# Patient Record
Sex: Male | Born: 1945
Health system: Southern US, Community
[De-identification: ages and names within clinical notes are randomized; demographics above are authoritative.]

## PROBLEM LIST (undated history)

## (undated) DIAGNOSIS — G629 Polyneuropathy, unspecified: Secondary | ICD-10-CM

## (undated) DIAGNOSIS — I739 Peripheral vascular disease, unspecified: Secondary | ICD-10-CM

## (undated) DIAGNOSIS — R040 Epistaxis: Secondary | ICD-10-CM

## (undated) DIAGNOSIS — H409 Unspecified glaucoma: Secondary | ICD-10-CM

## (undated) DIAGNOSIS — E7800 Pure hypercholesterolemia, unspecified: Secondary | ICD-10-CM

## (undated) HISTORY — PX: BACK SURGERY: SHX140

## (undated) HISTORY — DX: Unspecified glaucoma: H40.9

## (undated) HISTORY — DX: Peripheral vascular disease, unspecified: I73.9

## (undated) HISTORY — DX: Pure hypercholesterolemia, unspecified: E78.00

---
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
## 1997-12-21 ENCOUNTER — Emergency Department (HOSPITAL_COMMUNITY): Admission: EM | Admit: 1997-12-21 | Discharge: 1997-12-21 | Payer: Self-pay | Admitting: Emergency Medicine

## 2000-04-14 ENCOUNTER — Ambulatory Visit (HOSPITAL_COMMUNITY): Admission: RE | Admit: 2000-04-14 | Discharge: 2000-04-14 | Payer: Self-pay | Admitting: Neurosurgery

## 2000-04-14 ENCOUNTER — Encounter: Payer: Self-pay | Admitting: Neurosurgery

## 2003-04-04 ENCOUNTER — Emergency Department (HOSPITAL_COMMUNITY): Admission: EM | Admit: 2003-04-04 | Discharge: 2003-04-04 | Payer: Self-pay | Admitting: Emergency Medicine

## 2005-05-08 HISTORY — PX: CATARACT EXTRACTION: SUR2

## 2006-08-13 ENCOUNTER — Emergency Department (HOSPITAL_COMMUNITY): Admission: EM | Admit: 2006-08-13 | Discharge: 2006-08-13 | Payer: Self-pay | Admitting: Emergency Medicine

## 2008-04-26 ENCOUNTER — Emergency Department (HOSPITAL_COMMUNITY): Admission: EM | Admit: 2008-04-26 | Discharge: 2008-04-26 | Payer: Self-pay | Admitting: Emergency Medicine

## 2015-06-01 DIAGNOSIS — H401131 Primary open-angle glaucoma, bilateral, mild stage: Secondary | ICD-10-CM | POA: Diagnosis not present

## 2015-07-23 DIAGNOSIS — J019 Acute sinusitis, unspecified: Secondary | ICD-10-CM | POA: Diagnosis not present

## 2015-07-23 DIAGNOSIS — J209 Acute bronchitis, unspecified: Secondary | ICD-10-CM | POA: Diagnosis not present

## 2015-07-30 DIAGNOSIS — J181 Lobar pneumonia, unspecified organism: Secondary | ICD-10-CM | POA: Diagnosis not present

## 2015-09-21 DIAGNOSIS — H401131 Primary open-angle glaucoma, bilateral, mild stage: Secondary | ICD-10-CM | POA: Diagnosis not present

## 2015-10-18 DIAGNOSIS — K529 Noninfective gastroenteritis and colitis, unspecified: Secondary | ICD-10-CM | POA: Diagnosis not present

## 2015-10-19 DIAGNOSIS — E7800 Pure hypercholesterolemia, unspecified: Secondary | ICD-10-CM | POA: Diagnosis not present

## 2015-10-19 DIAGNOSIS — R739 Hyperglycemia, unspecified: Secondary | ICD-10-CM | POA: Diagnosis not present

## 2015-10-19 DIAGNOSIS — I10 Essential (primary) hypertension: Secondary | ICD-10-CM | POA: Diagnosis not present

## 2015-10-21 DIAGNOSIS — G629 Polyneuropathy, unspecified: Secondary | ICD-10-CM | POA: Diagnosis not present

## 2015-10-21 DIAGNOSIS — H4053X1 Glaucoma secondary to other eye disorders, bilateral, mild stage: Secondary | ICD-10-CM | POA: Diagnosis not present

## 2015-10-21 DIAGNOSIS — M545 Low back pain: Secondary | ICD-10-CM | POA: Diagnosis not present

## 2015-10-21 DIAGNOSIS — R739 Hyperglycemia, unspecified: Secondary | ICD-10-CM | POA: Diagnosis not present

## 2016-01-04 DIAGNOSIS — H401131 Primary open-angle glaucoma, bilateral, mild stage: Secondary | ICD-10-CM | POA: Diagnosis not present

## 2016-01-04 DIAGNOSIS — Z961 Presence of intraocular lens: Secondary | ICD-10-CM | POA: Diagnosis not present

## 2016-01-04 DIAGNOSIS — H524 Presbyopia: Secondary | ICD-10-CM | POA: Diagnosis not present

## 2016-01-18 DIAGNOSIS — D1809 Hemangioma of other sites: Secondary | ICD-10-CM | POA: Diagnosis not present

## 2016-01-26 DIAGNOSIS — T148 Other injury of unspecified body region: Secondary | ICD-10-CM | POA: Diagnosis not present

## 2016-01-26 DIAGNOSIS — D1809 Hemangioma of other sites: Secondary | ICD-10-CM | POA: Diagnosis not present

## 2016-03-11 DIAGNOSIS — Z6826 Body mass index (BMI) 26.0-26.9, adult: Secondary | ICD-10-CM | POA: Diagnosis not present

## 2016-03-11 DIAGNOSIS — S93529A Sprain of metatarsophalangeal joint of unspecified toe(s), initial encounter: Secondary | ICD-10-CM | POA: Diagnosis not present

## 2016-03-11 DIAGNOSIS — S90412A Abrasion, left great toe, initial encounter: Secondary | ICD-10-CM | POA: Diagnosis not present

## 2016-04-10 DIAGNOSIS — H401131 Primary open-angle glaucoma, bilateral, mild stage: Secondary | ICD-10-CM | POA: Diagnosis not present

## 2016-04-19 DIAGNOSIS — I10 Essential (primary) hypertension: Secondary | ICD-10-CM | POA: Diagnosis not present

## 2016-04-19 DIAGNOSIS — E7800 Pure hypercholesterolemia, unspecified: Secondary | ICD-10-CM | POA: Diagnosis not present

## 2016-04-19 DIAGNOSIS — R739 Hyperglycemia, unspecified: Secondary | ICD-10-CM | POA: Diagnosis not present

## 2016-04-24 DIAGNOSIS — H4053X1 Glaucoma secondary to other eye disorders, bilateral, mild stage: Secondary | ICD-10-CM | POA: Diagnosis not present

## 2016-04-24 DIAGNOSIS — Z1389 Encounter for screening for other disorder: Secondary | ICD-10-CM | POA: Diagnosis not present

## 2016-04-24 DIAGNOSIS — G629 Polyneuropathy, unspecified: Secondary | ICD-10-CM | POA: Diagnosis not present

## 2016-04-24 DIAGNOSIS — M545 Low back pain: Secondary | ICD-10-CM | POA: Diagnosis not present

## 2016-04-24 DIAGNOSIS — D485 Neoplasm of uncertain behavior of skin: Secondary | ICD-10-CM | POA: Diagnosis not present

## 2016-04-24 DIAGNOSIS — R739 Hyperglycemia, unspecified: Secondary | ICD-10-CM | POA: Diagnosis not present

## 2016-04-25 DIAGNOSIS — D225 Melanocytic nevi of trunk: Secondary | ICD-10-CM | POA: Diagnosis not present

## 2016-04-25 DIAGNOSIS — L708 Other acne: Secondary | ICD-10-CM | POA: Diagnosis not present

## 2016-06-06 DIAGNOSIS — I872 Venous insufficiency (chronic) (peripheral): Secondary | ICD-10-CM | POA: Diagnosis not present

## 2016-06-06 DIAGNOSIS — L280 Lichen simplex chronicus: Secondary | ICD-10-CM | POA: Diagnosis not present

## 2016-07-25 DIAGNOSIS — H401131 Primary open-angle glaucoma, bilateral, mild stage: Secondary | ICD-10-CM | POA: Diagnosis not present

## 2016-08-14 DIAGNOSIS — H6503 Acute serous otitis media, bilateral: Secondary | ICD-10-CM | POA: Diagnosis not present

## 2016-08-14 DIAGNOSIS — M21371 Foot drop, right foot: Secondary | ICD-10-CM | POA: Diagnosis not present

## 2016-08-14 DIAGNOSIS — Z6826 Body mass index (BMI) 26.0-26.9, adult: Secondary | ICD-10-CM | POA: Diagnosis not present

## 2016-08-21 ENCOUNTER — Ambulatory Visit: Payer: PPO | Admitting: Neurology

## 2016-08-21 ENCOUNTER — Telehealth: Payer: Self-pay | Admitting: *Deleted

## 2016-08-21 NOTE — Telephone Encounter (Signed)
Called and spoke with patient. Advised office closed today. Need to r/s appt. He preferred sooner appt. Scheduled with AA,MD 08/31/16 at 8am, check in 730am. He verbalized understanding and appreciation.

## 2016-08-31 ENCOUNTER — Encounter: Payer: Self-pay | Admitting: Neurology

## 2016-08-31 ENCOUNTER — Ambulatory Visit (INDEPENDENT_AMBULATORY_CARE_PROVIDER_SITE_OTHER): Payer: PPO | Admitting: Neurology

## 2016-08-31 VITALS — BP 158/79 | HR 58 | Ht 73.0 in | Wt 191.8 lb

## 2016-08-31 DIAGNOSIS — G8929 Other chronic pain: Secondary | ICD-10-CM | POA: Diagnosis not present

## 2016-08-31 DIAGNOSIS — W19XXXA Unspecified fall, initial encounter: Secondary | ICD-10-CM

## 2016-08-31 DIAGNOSIS — M5440 Lumbago with sciatica, unspecified side: Secondary | ICD-10-CM

## 2016-08-31 DIAGNOSIS — M21371 Foot drop, right foot: Secondary | ICD-10-CM | POA: Diagnosis not present

## 2016-08-31 DIAGNOSIS — M5417 Radiculopathy, lumbosacral region: Secondary | ICD-10-CM

## 2016-08-31 NOTE — Progress Notes (Signed)
[facility]    Provider:  Dr Jaynee Eagles Referring Provider: Manon Hilding, MD Primary Care Physician:  Manon Hilding, MD  CC:  Right foot drop  HPI:  Roberto Rosales is a 71 y.o. male here as a referral from Dr. Quintin Alto for right foot drop. Back pain started when he was 15. He lifted heavy boxes and had back pain. Over the years he had flares in extreme pain. The pain would feel weakness, pain in the lower back bilaterally, he has also had sciatica always on the right which is the side of the foot drop. He has right foot numbness. Not as bad in the morning, worse as the day goes on. All the toes of the right foot to the bottom of the foot on the right only. Right foot weakness started a few years ago and it flops on the floor, slowly progressive. One day he was driving and had difficulty feeling the brake pedal. He has had falls and difficulty walking. He had broken bones. He feels weak in his back. He tries to go the gym and exercise for years. He does not lift heavy items.   Reviewed notes, labs and imaging from outside physicians, which showed: Reviewed primary care notes. Presented with paresthesias. Located on both feet. As described is acute. With a root right foot drop. Symptoms started 2-3 years ago. A moderately limits activities. Reviewed lab work showed LDL 75, CMP unremarkable with BUN 14 and creatinine 0.86 drawn in December 2018.  Reviewed report MRI lumbar spine 2001:  FINDINGS CLINICAL HISTORY:  LOW BACK PAIN WITH RECENT EXTENSION INTO THE RIGHT LEG. MR OF THE LUMBOSACRAL SPINE:NO COMPARISON PLAIN FILMS.  FOR THE PRESENT EXAM I WILL ASSUME THERE ARE FIVE NONRIBBEARING LUMBAR TYPE VERTEBRAE.  PRIOR TO ANY INTERVENTION, I WOULD RECOMMEND CORRELATION WITH PLAIN FILMS TO HELP CONFIRM LEVEL ASSIGNMENT. FINDINGS:  CONUS IS LOCATED AT THE L1-2 DISK SPACE LEVEL.  SMALL HEMANGIOMAS/FOCAL FAT, RIGHT ASPECT, L1 AND MID L3 VERTEBRAE.  WITHIN THE LEFT ASPECT OF THE L5 VERTEBRA  THERE IS A LESION WHICH IS HYPOINTENSE ON ALL PULSE SEQUENCES AND DOES NOT HAVE AN AGGRESSIVE APPEARANCE.  T12-L1 AND L1-2  UNREMARKABLE. L2-3:  MINIMAL DISK DEGENERATION AND BULGE. L3-4:  MILD DISK DEGENERATION AND BULGE. L4-5:  MODERATE DISK DEGENERATION AND DISK SPACE NARROWING WITH SOME ASSOCIATED DEGENERATIVE END PLATE CHANGES.  THERE IS AN UNDERLYING MODERATE DIFFUSE BULGE WITH SUPERIMPOSED, MODERATE SIZE, RIGHT POSTEROLATERAL DISK HERNIATION WITH EXTRUDED DISK MATERIAL EXTENDING SLIGHTLY CEPHALAD FROM THE DISK SPACE LEVEL ON THE ELEVATED POSTERIOR LONGITUDINAL LIGAMENT INSINUATING ITSELF BETWEEN THE RIGHT ASPECT OF THE THECAL SAC (WHICH IS DISPLACED AND NARROWED) AND THE ORIGIN OF THE RIGHT L4-5 NEURAL FORAMEN. L5-S1:  NEGATIVE. IMPRESSION: MOST SIGNIFICANT FINDING ON THE PRESENT EXAM IS THE MODERATE SIZE RIGHT POSTEROLATERAL L4-5 DISK HERNIATION WITH EXTRUDED DISK FRAGMENT EXTENDING IN A CEPHALAD DIRECTION AND ELEVATING THE POSTERIOR LONGITUDINAL LIGAMENT WITH MASS EFFECT UPON THE RIGHT ASPECT OF THE THECAL SAC, AS DISCUSSED ABOVE.  THE UNDERLYING L4-5 DISK IS DEGENERATED WITH MODERATE BULGE.  Review of Systems: Patient complains of symptoms per HPI as well as the following symptoms: no CP. Pertinent negatives per HPI. All others negative.   Social History   Social History  . Marital status: Married    Spouse name: N/A  . Number of children: 2  . Years of education: 14   Occupational History  . Retired    Social History Main Topics  . Smoking status: Never Smoker  .  Smokeless tobacco: Never Used  . Alcohol use No  . Drug use: No  . Sexual activity: Not on file   Other Topics Concern  . Not on file   Social History Narrative   Lives at home w/ his wife   Right-handed   Caffeine: 1 - 1 1/2 cups coffee per day    Family History  Problem Relation Age of Onset  . Cerebral aneurysm Mother   . Heart disease Father   . Stroke Father     Past Medical History:  Diagnosis  Date  . Glaucoma   . High cholesterol     Past Surgical History:  Procedure Laterality Date  . CATARACT EXTRACTION Bilateral 2007  . TONSILLECTOMY  1950    Current Outpatient Prescriptions  Medication Sig Dispense Refill  . aspirin EC 81 MG tablet Take 81 mg by mouth daily.    Marland Kitchen atorvastatin (LIPITOR) 10 MG tablet     . latanoprost (XALATAN) 0.005 % ophthalmic solution     . Multiple Vitamin (MULTIVITAMIN) tablet Take 1 tablet by mouth daily.     No current facility-administered medications for this visit.     Allergies as of 08/31/2016 - Review Complete 08/31/2016  Allergen Reaction Noted  . Codeine Nausea Only 09/21/2015    Vitals: BP (!) 158/79   Pulse (!) 58   Ht 6\' 1"  (1.854 m)   Wt 191 lb 12.8 oz (87 kg)   BMI 25.30 kg/m  Last Weight:  Wt Readings from Last 1 Encounters:  08/31/16 191 lb 12.8 oz (87 kg)   Last Height:   Ht Readings from Last 1 Encounters:  08/31/16 6\' 1"  (1.854 m)   Physical exam: Exam: Gen: NAD, conversant, well nourised, obese, well groomed                     CV: RRR, no MRG. No Carotid Bruits. No peripheral edema, warm, nontender Eyes: Conjunctivae clear without exudates or hemorrhage  Neuro: Detailed Neurologic Exam  Speech:    Speech is normal; fluent and spontaneous with normal comprehension.  Cognition:    The patient is oriented to person, place, and time;     recent and remote memory intact;     language fluent;     normal attention, concentration,     fund of knowledge Cranial Nerves:    The pupils are equal, round, and reactive to light.Attempted fundoscopic exam could not visualize due to small pupils. . Visual fields are full to finger confrontation. Extraocular movements are intact. Trigeminal sensation is intact and the muscles of mastication are normal. The face is symmetric. The palate elevates in the midline. Hearing intact. Voice is normal. Shoulder shrug is normal. The tongue has normal motion without  fasciculations.   Coordination:    Normal finger to nose and heel to shin.   Gait:    Cannot heel walk on the right  Motor Observation:    No asymmetry, no atrophy, and no involuntary movements noted. Tone:    Normal muscle tone.    Posture:    Posture is normal. normal erect    Strength: Right weakness biceps femoris, Dorsiflexion, inversion and eversion. Dorsiflexion most affected 4/5. Otherwise strength is V/V in the upper and lower limbs.      Sensation: Decreased in an L5 distribution on the right     Reflex Exam:  DTR's:    Deep tendon reflexes in the lower extremities are absent Toes:    The toes  are downgoing bilaterally.   Clonus:    Clonus is absent.       Assessment/Plan:  This is a lovely 71 year old patient with likely right L5 radiculopathy leading to right foot drop and right leg weakness L5 myotome, numbness L5 distribution, falls. Need MRI of the lumbar spine for surgical evaluation. Fall risk, fall precautions. Discussed fall precautions with patient.  Cc: Manon Hilding, MD   Sarina Ill, MD  [facility] [address] [place], [place] [address]  Phone [phone] Fax [phone]

## 2016-08-31 NOTE — Patient Instructions (Signed)
Remember to drink plenty of fluid, eat healthy meals and do not skip any meals. Try to eat protein with a every meal and eat a healthy snack such as fruit or nuts in between meals. Try to keep a regular sleep-wake schedule and try to exercise daily, particularly in the form of walking, 20-30 minutes a day, if you can.   As far as diagnostic testing: MRI lumbar spine  Our phone number is [phone]. We also have an after hours call service for urgent matters and there is a physician on-call for urgent questions. For any emergencies you know to call 911 or go to the nearest emergency room

## 2016-09-18 ENCOUNTER — Ambulatory Visit (HOSPITAL_COMMUNITY)
Admission: RE | Admit: 2016-09-18 | Discharge: 2016-09-18 | Disposition: A | Discharge status: Home / Self Care | Payer: PPO | Source: Ambulatory Visit | Attending: Neurology | Admitting: Neurology

## 2016-09-18 DIAGNOSIS — G8929 Other chronic pain: Secondary | ICD-10-CM | POA: Diagnosis not present

## 2016-09-18 DIAGNOSIS — M5417 Radiculopathy, lumbosacral region: Secondary | ICD-10-CM | POA: Diagnosis not present

## 2016-09-18 DIAGNOSIS — M48061 Spinal stenosis, lumbar region without neurogenic claudication: Secondary | ICD-10-CM | POA: Diagnosis not present

## 2016-09-18 DIAGNOSIS — M4186 Other forms of scoliosis, lumbar region: Secondary | ICD-10-CM | POA: Insufficient documentation

## 2016-09-18 DIAGNOSIS — M5440 Lumbago with sciatica, unspecified side: Secondary | ICD-10-CM | POA: Insufficient documentation

## 2016-09-18 DIAGNOSIS — W19XXXA Unspecified fall, initial encounter: Secondary | ICD-10-CM

## 2016-09-18 DIAGNOSIS — M5124 Other intervertebral disc displacement, thoracic region: Secondary | ICD-10-CM | POA: Insufficient documentation

## 2016-09-18 DIAGNOSIS — M4606 Spinal enthesopathy, lumbar region: Secondary | ICD-10-CM | POA: Insufficient documentation

## 2016-09-18 DIAGNOSIS — S3992XA Unspecified injury of lower back, initial encounter: Secondary | ICD-10-CM | POA: Diagnosis not present

## 2016-09-18 DIAGNOSIS — M5136 Other intervertebral disc degeneration, lumbar region: Secondary | ICD-10-CM | POA: Insufficient documentation

## 2016-09-18 DIAGNOSIS — M5126 Other intervertebral disc displacement, lumbar region: Secondary | ICD-10-CM | POA: Insufficient documentation

## 2016-09-18 DIAGNOSIS — D1809 Hemangioma of other sites: Secondary | ICD-10-CM | POA: Insufficient documentation

## 2016-09-18 DIAGNOSIS — M21371 Foot drop, right foot: Secondary | ICD-10-CM | POA: Insufficient documentation

## 2016-09-25 ENCOUNTER — Telehealth: Payer: Self-pay | Admitting: Neurology

## 2016-09-25 DIAGNOSIS — M21371 Foot drop, right foot: Secondary | ICD-10-CM

## 2016-09-25 DIAGNOSIS — R937 Abnormal findings on diagnostic imaging of other parts of musculoskeletal system: Secondary | ICD-10-CM

## 2016-09-25 NOTE — Telephone Encounter (Signed)
Pt says he has the results of the MRI he wold like to be contacted by Dr Jaynee Eagles once she has had a chance to review, please call mobile

## 2016-09-26 NOTE — Telephone Encounter (Signed)
Returned pt TC w/ MRI results. He verbalized understanding and is agreeable to neurosurg referral.  Melvenia Beam, MD  Monte Fantasia, RN      Patient has pinched nerves in his low back on the right and this is likely causing his foot drop. I reocmmend neusosurgical evaluation for decompression. Please let me know if he is ok and you or I can order a NSY eval for right L5 radiculopathy and right foot drop. thanks    Referral orders entered.

## 2016-09-26 NOTE — Addendum Note (Signed)
Addended by: Monte Fantasia on: 09/26/2016 11:50 AM   Modules accepted: Orders

## 2016-10-04 DIAGNOSIS — M5417 Radiculopathy, lumbosacral region: Secondary | ICD-10-CM | POA: Diagnosis not present

## 2016-10-04 DIAGNOSIS — R030 Elevated blood-pressure reading, without diagnosis of hypertension: Secondary | ICD-10-CM | POA: Diagnosis not present

## 2016-10-04 DIAGNOSIS — M21371 Foot drop, right foot: Secondary | ICD-10-CM | POA: Diagnosis not present

## 2016-10-04 DIAGNOSIS — Z6825 Body mass index (BMI) 25.0-25.9, adult: Secondary | ICD-10-CM | POA: Diagnosis not present

## 2016-10-18 DIAGNOSIS — R739 Hyperglycemia, unspecified: Secondary | ICD-10-CM | POA: Diagnosis not present

## 2016-10-18 DIAGNOSIS — E7800 Pure hypercholesterolemia, unspecified: Secondary | ICD-10-CM | POA: Diagnosis not present

## 2016-10-18 DIAGNOSIS — I10 Essential (primary) hypertension: Secondary | ICD-10-CM | POA: Diagnosis not present

## 2016-10-23 DIAGNOSIS — E7800 Pure hypercholesterolemia, unspecified: Secondary | ICD-10-CM | POA: Diagnosis not present

## 2016-10-23 DIAGNOSIS — G629 Polyneuropathy, unspecified: Secondary | ICD-10-CM | POA: Diagnosis not present

## 2016-10-23 DIAGNOSIS — M545 Low back pain: Secondary | ICD-10-CM | POA: Diagnosis not present

## 2016-10-23 DIAGNOSIS — H4053X1 Glaucoma secondary to other eye disorders, bilateral, mild stage: Secondary | ICD-10-CM | POA: Diagnosis not present

## 2016-10-23 DIAGNOSIS — Z6826 Body mass index (BMI) 26.0-26.9, adult: Secondary | ICD-10-CM | POA: Diagnosis not present

## 2016-10-23 DIAGNOSIS — R739 Hyperglycemia, unspecified: Secondary | ICD-10-CM | POA: Diagnosis not present

## 2016-10-31 DIAGNOSIS — M5416 Radiculopathy, lumbar region: Secondary | ICD-10-CM | POA: Diagnosis not present

## 2016-10-31 DIAGNOSIS — M4316 Spondylolisthesis, lumbar region: Secondary | ICD-10-CM | POA: Diagnosis not present

## 2016-10-31 DIAGNOSIS — M48061 Spinal stenosis, lumbar region without neurogenic claudication: Secondary | ICD-10-CM | POA: Diagnosis not present

## 2016-10-31 DIAGNOSIS — M47896 Other spondylosis, lumbar region: Secondary | ICD-10-CM | POA: Diagnosis not present

## 2016-11-14 DIAGNOSIS — H401131 Primary open-angle glaucoma, bilateral, mild stage: Secondary | ICD-10-CM | POA: Diagnosis not present

## 2016-12-27 DIAGNOSIS — I872 Venous insufficiency (chronic) (peripheral): Secondary | ICD-10-CM | POA: Diagnosis not present

## 2016-12-27 DIAGNOSIS — D1801 Hemangioma of skin and subcutaneous tissue: Secondary | ICD-10-CM | POA: Diagnosis not present

## 2017-01-15 DIAGNOSIS — Z6826 Body mass index (BMI) 26.0-26.9, adult: Secondary | ICD-10-CM | POA: Diagnosis not present

## 2017-01-15 DIAGNOSIS — R202 Paresthesia of skin: Secondary | ICD-10-CM | POA: Diagnosis not present

## 2017-01-29 DIAGNOSIS — Z6826 Body mass index (BMI) 26.0-26.9, adult: Secondary | ICD-10-CM | POA: Diagnosis not present

## 2017-01-29 DIAGNOSIS — R202 Paresthesia of skin: Secondary | ICD-10-CM | POA: Diagnosis not present

## 2017-01-29 DIAGNOSIS — G5601 Carpal tunnel syndrome, right upper limb: Secondary | ICD-10-CM | POA: Diagnosis not present

## 2017-02-20 DIAGNOSIS — Z961 Presence of intraocular lens: Secondary | ICD-10-CM | POA: Diagnosis not present

## 2017-02-20 DIAGNOSIS — H401131 Primary open-angle glaucoma, bilateral, mild stage: Secondary | ICD-10-CM | POA: Diagnosis not present

## 2017-02-20 DIAGNOSIS — G245 Blepharospasm: Secondary | ICD-10-CM | POA: Diagnosis not present

## 2017-02-22 ENCOUNTER — Ambulatory Visit (INDEPENDENT_AMBULATORY_CARE_PROVIDER_SITE_OTHER): Payer: PPO | Admitting: Neurology

## 2017-02-22 ENCOUNTER — Ambulatory Visit (INDEPENDENT_AMBULATORY_CARE_PROVIDER_SITE_OTHER): Payer: Self-pay | Admitting: Neurology

## 2017-02-22 DIAGNOSIS — G5601 Carpal tunnel syndrome, right upper limb: Secondary | ICD-10-CM | POA: Diagnosis not present

## 2017-02-22 DIAGNOSIS — G629 Polyneuropathy, unspecified: Secondary | ICD-10-CM

## 2017-02-22 DIAGNOSIS — R7309 Other abnormal glucose: Secondary | ICD-10-CM | POA: Diagnosis not present

## 2017-02-22 DIAGNOSIS — E519 Thiamine deficiency, unspecified: Secondary | ICD-10-CM | POA: Diagnosis not present

## 2017-02-22 DIAGNOSIS — M21371 Foot drop, right foot: Secondary | ICD-10-CM | POA: Diagnosis not present

## 2017-02-22 DIAGNOSIS — E538 Deficiency of other specified B group vitamins: Secondary | ICD-10-CM

## 2017-02-22 DIAGNOSIS — E531 Pyridoxine deficiency: Secondary | ICD-10-CM | POA: Diagnosis not present

## 2017-02-22 DIAGNOSIS — Z0289 Encounter for other administrative examinations: Secondary | ICD-10-CM

## 2017-02-23 NOTE — Progress Notes (Addendum)
Full Name: Roberto Rosales Gender: Male MRN #: [id number] Date of Birth: [birth year]    Visit Date: 02/22/17 08:53 Age: 72 Years 81 Months Old Examining Physician: Sarina Ill, MD  Referring Physician: Consuello Masse, MD  History: Patient with right hand pain and weakness. +Tinel's sign and Phalen's maneuver.  Summary: The right median motor distal peak latency was delayed (5.1 ms, N>4.4). The right ulnar motor distal peak latency was delayed (3.7 ms, N>3.3) with reduced amplitude (5.8 mV, N>6). The left ulnar motor distal peak latency was delayed (3.4 ms, N>3.3) with reduced amplitude (5.1 mV, N>6). The left peroneal and left tibial motor nerve showed no response. The right radial sensory nerve showed reduced amplitude (5 V, N>15). The left radial sensory nerve showed reduced amplitude (10 V, N>15).The left sural and left superficial peroneal sensory nerves showed no response. The right median/ulnar (palm) comparison nerve showed prolonged distal peak latency (Median Palm, 3.1 ms, N<2.2) and prolonged distal peak latency (ulnar palm, 2.6 ms, N<2.2) and abnormal peak latency difference (Median Palm-Ulnar Palm, 0.5 ms, N<0.4) with a relative median delay.  The left median/ulnar (palm) comparison nerve showed prolonged distal peak latency (Median Palm, 2.8 ms, N<2.2) and prolonged distal peak latency (ulnar palm, 2.6 ms, N<2.2) but normal peak latency difference (Median Palm-Ulnar Palm, 0.3 ms, N<0.4) with a relative median delay. All remaining nerves were within normal limits. All muscles were within normal limits.    Conclusion: Patient has right carpal tunnel syndrome. He also has concomitant length-dependent, axonal, sensorimotor polyneuropathy. Will refer to neurosurgery for Carpal Tunnel Release. Also discussed patient's polyneuropathy and will order a complete serum neuropathy panel for possible etiology.  A total of 15 minutes was spent in with this patient. Over half this time was spent  on counseling patient on the peripheral neuropathy diagnosis and different therapeutic options available. This does not include time spent on EMG/NCS diagnosing Carpal Tunnel Syndrome.   Orders Placed This Encounter  Procedures  . Angiotensin converting enzyme  . Sjogren's syndrome antibods(ssa + ssb)  . Pan-ANCA  . TSH  . Sedimentation rate  . ANA w/Reflex  . B12 and Folate Panel  . RPR  . Hepatitis C antibody  . Rheumatoid factor  . Heavy metals, blood  . Vitamin B6  . Multiple Myeloma Panel (SPEP&IFE w/QIG)  . B. burgdorfi Antibody  . Hemoglobin A1c  . Methylmalonic acid, serum  . Vitamin B1  . Ambulatory referral to Neurosurgery    Cc: Dr. Wray Kearns, M.D.  [facility] [place], [place] [address] Tel: [phone] Fax: [phone]        [facility]    Nerve / Sites Muscle Latency Ref. Amplitude Ref. Rel Amp Segments Distance Velocity Ref. Area    ms ms mV mV %  cm m/s m/s mVms  R Median - APB     Wrist APB 5.1 ?4.4 6.2 ?4.0 100 Wrist - APB 7   21.9     Upper arm APB 11.4  5.7  92.5 Upper arm - Wrist 26 41 ?49 19.9  L Median - APB     Wrist APB 4.4 ?4.4 4.8 ?4.0 100 Wrist - APB 7   17.1     Upper arm APB 10.5  4.3  89.4 Upper arm - Wrist 26 42 ?49 17.0  R Ulnar - ADM     Wrist ADM 3.7 ?3.3 5.8 ?6.0 100 Wrist - ADM 7   21.8  B.Elbow ADM 8.1  5.3  91.7 B.Elbow - Wrist 22 50 ?49 20.9     A.Elbow ADM 10.2  5.3  99.2 A.Elbow - B.Elbow 10 47 ?49 21.3         A.Elbow - Wrist      L Ulnar - ADM     Wrist ADM 3.4 ?3.3 5.1 ?6.0 100 Wrist - ADM 7   19.0     B.Elbow ADM 8.0  4.7  91.6 B.Elbow - Wrist 22 48 ?49 18.3     A.Elbow ADM 10.2  4.5  97.3 A.Elbow - B.Elbow 10 47 ?49 18.4         A.Elbow - Wrist      L Peroneal - EDB     Ankle EDB NR ?6.5 NR ?2.0 NR Ankle - EDB 9   NR     Fib head EDB NR  NR  NR Fib head - Ankle   ?44 NR         Pop fossa - Ankle      L Tibial - AH     Ankle AH NR ?5.8 NR ?4.0 NR Ankle - AH 9   NR  L  Peroneal - Tib Ant     Fib Head Tib Ant 3.8 ?6.7 3.6 ?3.0 100 Fib Head - Tib Ant 10   22.4     Pop fossa Tib Ant 6.3  3.5  98.1 Pop fossa - Fib Head 10 40 ?44 22.1                       SNC    Nerve / Sites Rec. Site Peak Lat Ref.  Amp Ref. Segments Distance Peak Diff Ref.    ms ms V V  cm ms ms  R Radial - Anatomical snuff box (Forearm)     Forearm Wrist 2.9 ?2.9 5 ?15 Forearm - Wrist 10    L Radial - Anatomical snuff box (Forearm)     Forearm Wrist 2.8 ?2.9 10 ?15 Forearm - Wrist 10    L Sural - Ankle (Calf)     Calf Ankle NR ?4.4 NR ?6 Calf - Ankle 14    L Superficial peroneal - Ankle     Lat leg Ankle NR ?4.4 NR ?6 Lat leg - Ankle 14    R Median, Ulnar - Transcarpal comparison     Median Palm Wrist 3.1 ?2.2 9 ?35 Median Palm - Wrist 8       Ulnar Palm Wrist 2.6 ?2.2 8 ?12 Ulnar Palm - Wrist 8          Median Palm - Ulnar Palm  0.5 ?0.4  L Median, Ulnar - Transcarpal comparison     Median Palm Wrist 2.8 ?2.2 10 ?35 Median Palm - Wrist 8       Ulnar Palm Wrist 2.6 ?2.2 6 ?12 Ulnar Palm - Wrist 8          Median Palm - Ulnar Palm  0.3 ?0.4  R Median - Orthodromic (Dig II, Mid palm)     Dig II Wrist 4.4 ?3.4 3 ?10 Dig II - Wrist 13    L Median - Orthodromic (Dig II, Mid palm)     Dig II Wrist 3.9 ?3.4 6 ?10 Dig II - Wrist 13    R Ulnar - Orthodromic, (Dig V, Mid palm)     Dig V Wrist 3.2 ?3.1 3 ?5 Dig V - Wrist 11    L  Ulnar - Orthodromic, (Dig V, Mid palm)     Dig V Wrist 3.2 ?3.1 5 ?5 Dig V - Wrist 37                           F  Wave    Nerve F Lat Ref.   ms ms  R Ulnar - ADM 36.0 ?32.0  L Ulnar - ADM 36.4 ?32.0         EMG full       EMG Summary Table    Spontaneous MUAP Recruitment  Muscle IA Fib PSW Fasc Other Amp Dur. Poly Pattern  R. Deltoid Normal None None None _______ Normal Normal Normal Normal  R. Triceps brachii Normal None None None _______ Normal Normal Normal Normal  R. Pronator teres Normal None None None _______ Normal Normal Normal Normal  R.  First dorsal interosseous Normal None None None _______ Normal Normal Normal Normal  R. Opponens pollicis Normal None None None _______ Normal Normal Normal Normal  R. Cervical paraspinals (low) Normal None None None _______ Normal Normal Normal Normal

## 2017-02-23 NOTE — Progress Notes (Signed)
See procedure note.

## 2017-02-26 ENCOUNTER — Ambulatory Visit (INDEPENDENT_AMBULATORY_CARE_PROVIDER_SITE_OTHER): Payer: PPO | Admitting: Neurology

## 2017-02-26 ENCOUNTER — Encounter: Payer: Self-pay | Admitting: Neurology

## 2017-02-26 VITALS — BP 136/76 | HR 73 | Wt 195.0 lb

## 2017-02-26 DIAGNOSIS — M21371 Foot drop, right foot: Secondary | ICD-10-CM

## 2017-02-26 NOTE — Patient Instructions (Signed)
Alpha-lipoic acid 400-600mg  daily   Peripheral Neuropathy Peripheral neuropathy is a type of nerve damage. It affects nerves that carry signals between the spinal cord and other parts of the body. These are called peripheral nerves. With peripheral neuropathy, one nerve or a group of nerves may be damaged. What are the causes? Many things can damage peripheral nerves. For some people with peripheral neuropathy, the cause is unknown. Some causes include:  Diabetes. This is the most common cause of peripheral neuropathy.  Injury to a nerve.  Pressure or stress on a nerve that lasts a long time.  Too little vitamin B. Alcoholism can lead to this.  Infections.  Autoimmune diseases, such as multiple sclerosis and systemic lupus erythematosus.  Inherited nerve diseases.  Some medicines, such as cancer drugs.  Toxic substances, such as lead and mercury.  Too little blood flowing to the legs.  Kidney disease.  Thyroid disease.  What are the signs or symptoms? Different people have different symptoms. The symptoms you have will depend on which of your nerves is damaged. Common symptoms include:  Loss of feeling (numbness) in the feet and hands.  Tingling in the feet and hands.  Pain that burns.  Very sensitive skin.  Weakness.  Not being able to move a part of the body (paralysis).  Muscle twitching.  Clumsiness or poor coordination.  Loss of balance.  Not being able to control your bladder.  Feeling dizzy.  Sexual problems.  How is this diagnosed? Peripheral neuropathy is a symptom, not a disease. Finding the cause of peripheral neuropathy can be hard. To figure that out, your health care provider will take a medical history and do a physical exam. A neurological exam will also be done. This involves checking things affected by your brain, spinal cord, and nerves (nervous system). For example, your health care provider will check your reflexes, how you move, and  what you can feel. Other types of tests may also be ordered, such as:  Blood tests.  A test of the fluid in your spinal cord.  Imaging tests, such as CT scans or an MRI.  Electromyography (EMG). This test checks the nerves that control muscles.  Nerve conduction velocity tests. These tests check how fast messages pass through your nerves.  Nerve biopsy. A small piece of nerve is removed. It is then checked under a microscope.  How is this treated?  Medicine is often used to treat peripheral neuropathy. Medicines may include: ? Pain-relieving medicines. Prescription or over-the-counter medicine may be suggested. ? Antiseizure medicine. This may be used for pain. ? Antidepressants. These also may help ease pain from neuropathy. ? Lidocaine. This is a numbing medicine. You might wear a patch or be given a shot. ? Mexiletine. This medicine is typically used to help control irregular heart rhythms.  Surgery. Surgery may be needed to relieve pressure on a nerve or to destroy a nerve that is causing pain.  Physical therapy to help movement.  Assistive devices to help movement. Follow these instructions at home:  Only take over-the-counter or prescription medicines as directed by your health care provider. Follow the instructions carefully for any given medicines. Do not take any other medicines without first getting approval from your health care provider.  If you have diabetes, work closely with your health care provider to keep your blood sugar under control.  If you have numbness in your feet: ? Check every day for signs of injury or infection. Watch for redness, warmth, and swelling. ?  Wear padded socks and comfortable shoes. These help protect your feet.  Do not do things that put pressure on your damaged nerve.  Do not smoke. Smoking keeps blood from getting to damaged nerves.  Avoid or limit alcohol. Too much alcohol can cause a lack of B vitamins. These vitamins are needed  for healthy nerves.  Develop a good support system. Coping with peripheral neuropathy can be stressful. Talk to a mental health specialist or join a support group if you are struggling.  Follow up with your health care provider as directed. Contact a health care provider if:  You have new signs or symptoms of peripheral neuropathy.  You are struggling emotionally from dealing with peripheral neuropathy.  You have a fever. Get help right away if:  You have an injury or infection that is not healing.  You feel very dizzy or begin vomiting.  You have chest pain.  You have trouble breathing. This information is not intended to replace advice given to you by your health care provider. Make sure you discuss any questions you have with your health care provider. Document Released: 04/14/2002 Document Revised: 09/30/2015 Document Reviewed: 12/30/2012 Elsevier Interactive Patient Education  2017 [employer].

## 2017-02-26 NOTE — Progress Notes (Signed)
[facility]    Provider:  Dr Jaynee Eagles Referring Provider: Manon Hilding, MD Primary Care Physician:  Manon Hilding, MD   CC:  Right foot drop  He has numbness in the toes, no pain, no burning, no coldness, no shooting pains, been stable for 10 years, denies exposure to toxins or alcohol use, he has been on a statin for years. Reviewed medications below, negative.   Drugs that Can Induce Polyneuropathies Antibiotics: Chloramphenicol Sensory, optic neuropathy, Chloroquin Sensory, Dapsone Motor, Didanosine Sensory, Ethambutol Sensorimotor, Ethionamide Sensory, Isoniazid Sensory (vitamin B6 deficiency), Metronidazole Sensory, Nitrofurantoin Sensorimotor, Savudine Sensory, Suramin Suramin Sensorimotor, Zalcitabine Sensory Chemotherapeutic: Cisplatin Sensorimotor + ototoxicity, Cytarabine Sensory, Docetaxe Sensorimotor, Paclitaxel Sensorimotor, Procarbazine Sensorimotor, Vinblastine Sensorimotor, Vincristine Sensorimotor,  Cardiovascular: Amiodarone Sensorimotor +ototoxicity, Captopril Sensorimotor Enalapril Sensorimotor, Flecainide Sensory, Hydralazine Sensory (vitamin B6 deficiency), Perhexiline Sensorimotor Rheumatologic: Allopurinol Sensorimotor,Colchicine Sensory, Gold Sensorimotor, Indomethacin Sensorimotor Miscellaneous: Disulfiram Sensory, Interferon alfa Sensorimotor, Lithium Sensorimotor, Lovastatin Sensorimotor, Phenytoin Sensorimotor, Pyridoxine Sensory, Simvastatin Sensorimotor, Thalidomide Sensorimotor, crylamide Sensorimotor + ataxia, Allyl chloride Sensory, Arsenic Sensorimotor, Carbon disulfide Sensorimotor, Ethylene oxide Sensorimotor + ataxia, Hexacarbons Sensorimotor, Lead _ Mercury Sensorimotor(motor > sensory), Organohosphorus esters Sensorimotor + autonomic (cholinergic), Thallium Sensorimotor, Trichloroethylene Cranial neuropathies  HPI:  Roberto Rosales is a 71 y.o. male here as a referral from Dr. Quintin Alto for right foot drop. Back pain started when he was  15. He lifted heavy boxes and had back pain. Over the years he had flares in extreme pain. The pain would feel weakness, pain in the lower back bilaterally, he has also had sciatica always on the right which is the side of the foot drop. He has right foot numbness. Not as bad in the morning, worse as the day goes on. All the toes of the right foot to the bottom of the foot on the right only. Right foot weakness started a few years ago and it flops on the floor, slowly progressive. One day he was driving and had difficulty feeling the brake pedal. He has had falls and difficulty walking. He had broken bones. He feels weak in his back. He tries to go the gym and exercise for years. He does not lift heavy items.   Reviewed notes, labs and imaging from outside physicians, which showed: Reviewed primary care notes. Presented with paresthesias. Located on both feet. As described is acute. With a root right foot drop. Symptoms started 2-3 years ago. A moderately limits activities. Reviewed lab work showed LDL 75, CMP unremarkable with BUN 14 and creatinine 0.86 drawn in December 2018.  Reviewed report MRI lumbar spine 2001:  FINDINGS CLINICAL HISTORY: LOW BACK PAIN WITH RECENT EXTENSION INTO THE RIGHT LEG. MR OF THE LUMBOSACRAL SPINE:NO COMPARISON PLAIN FILMS. FOR THE PRESENT EXAM I WILL ASSUME THERE ARE FIVE NONRIBBEARING LUMBAR TYPE VERTEBRAE. PRIOR TO ANY INTERVENTION, I WOULD RECOMMEND CORRELATION WITH PLAIN FILMS TO HELP CONFIRM LEVEL ASSIGNMENT. FINDINGS: CONUS IS LOCATED AT THE L1-2 DISK SPACE LEVEL. SMALL HEMANGIOMAS/FOCAL FAT, RIGHT ASPECT, L1 AND MID L3 VERTEBRAE. WITHIN THE LEFT ASPECT OF THE L5 VERTEBRA THERE IS A LESION WHICH IS HYPOINTENSE ON ALL PULSE SEQUENCES AND DOES NOT HAVE AN AGGRESSIVE APPEARANCE. T12-L1 AND L1-2  UNREMARKABLE. L2-3: MINIMAL DISK DEGENERATION AND BULGE. L3-4: MILD DISK DEGENERATION AND BULGE. L4-5: MODERATE DISK DEGENERATION AND DISK SPACE NARROWING WITH  SOME ASSOCIATED DEGENERATIVE END PLATE CHANGES. THERE IS AN UNDERLYING MODERATE DIFFUSE BULGE WITH SUPERIMPOSED, MODERATE SIZE, RIGHT POSTEROLATERAL DISK HERNIATION WITH EXTRUDED DISK MATERIAL EXTENDING SLIGHTLY CEPHALAD FROM THE DISK SPACE  LEVEL ON THE ELEVATED POSTERIOR LONGITUDINAL LIGAMENT INSINUATING ITSELF BETWEEN THE RIGHT ASPECT OF THE THECAL SAC (WHICH IS DISPLACED AND NARROWED) AND THE ORIGIN OF THE RIGHT L4-5 NEURAL FORAMEN. L5-S1: NEGATIVE. IMPRESSION: MOST SIGNIFICANT FINDING ON THE PRESENT EXAM IS THE MODERATE SIZE RIGHT POSTEROLATERAL L4-5 DISK HERNIATION WITH EXTRUDED DISK FRAGMENT EXTENDING IN A CEPHALAD DIRECTION AND ELEVATING THE POSTERIOR LONGITUDINAL LIGAMENT WITH MASS EFFECT UPON THE RIGHT ASPECT OF THE THECAL SAC, AS DISCUSSED ABOVE. THE UNDERLYING L4-5 DISK IS DEGENERATED WITH MODERATE BULGE.  Review of Systems: Patient complains of symptoms per HPI as well as the following symptoms: no CP. Pertinent negatives per HPI. All others negative.   Social History   Social History  . Marital status: Married    Spouse name: N/A  . Number of children: 2  . Years of education: 14   Occupational History  . Retired    Social History Main Topics  . Smoking status: Never Smoker  . Smokeless tobacco: Never Used  . Alcohol use No  . Drug use: No  . Sexual activity: Not on file   Other Topics Concern  . Not on file   Social History Narrative   Lives at home w/ his wife   Right-handed   Caffeine: 1 - 1 1/2 cups coffee per day    Family History  Problem Relation Age of Onset  . Cerebral aneurysm Mother   . Heart disease Father   . Stroke Father   . Neuropathy Neg Hx     Past Medical History:  Diagnosis Date  . Glaucoma   . High cholesterol     Past Surgical History:  Procedure Laterality Date  . CATARACT EXTRACTION Bilateral 2007  . TONSILLECTOMY  1950    Current Outpatient Prescriptions  Medication Sig Dispense Refill  . atorvastatin (LIPITOR) 10 MG  tablet Take 5 mg by mouth.     . latanoprost (XALATAN) 0.005 % ophthalmic solution     . Multiple Vitamin (MULTIVITAMIN) tablet Take 1 tablet by mouth daily.     No current facility-administered medications for this visit.     Allergies as of 02/26/2017 - Review Complete 02/26/2017  Allergen Reaction Noted  . Codeine Nausea Only 09/21/2015    Vitals: BP 136/76   Pulse 73   Wt 195 lb (88.5 kg)   BMI 25.73 kg/m  Last Weight:  Wt Readings from Last 1 Encounters:  02/26/17 195 lb (88.5 kg)   Last Height:   Ht Readings from Last 1 Encounters:  08/31/16 6' 1" (1.854 m)   Cognition:    The patient is oriented to person, place, and time;     recent and remote memory intact;     language fluent;     normal attention, concentration,     fund of knowledge Cranial Nerves:    The pupils are equal, round, and reactive to light.Attempted fundoscopic exam could not visualize due to small pupils. . Visual fields are full to finger confrontation. Extraocular movements are intact. Trigeminal sensation is intact and the muscles of mastication are normal. The face is symmetric. The palate elevates in the midline. Hearing intact. Voice is normal. Shoulder shrug is normal. The tongue has normal motion without fasciculations.   Coordination:    Normal finger to nose and heel to shin.   Gait:    Cannot heel walk on the right  Motor Observation:    No asymmetry, no atrophy, and no involuntary movements noted. Tone:    Normal muscle tone.  Posture:    Posture is normal. normal erect    Strength: Right weakness biceps femoris, Dorsiflexion, inversion and eversion. Dorsiflexion most affected 4/5. Otherwise strength is V/V in the upper and lower limbs.      Sensation: Decreased in an L5 distribution on the right also distally absent      Assessment/Plan:  This is a lovely 71 year old patient with likely right L5 radiculopathy leading to right foot drop and right leg weakness L5  myotome, numbness L5 distribution, falls. . Fall risk, fall precautions. Discussed fall precautions with patient.  Foot drop: Physical Therapy. Still appears to be L5 radic however polyneuropathy can be contributor.  Distal polyneuropathy absent to all sensory (temp, pin, vibration, proprioception) in the great toes. Extensive testing negative, stable and idiopathic. Discussed fall risk. Needs right orthosis right foot drop Right CTS: Referral to [place] [facility] Dr. Saintclair Halsted  Orders Placed This Encounter  Procedures  . Ambulatory referral to Physical Therapy    Recent Results (from the past 2160 hour(s))  Angiotensin converting enzyme     Status: None   Collection Time: 02/22/17 10:51 AM  Result Value Ref Range   Angio Convert Enzyme 32 14 - 82 U/L  Sjogren's syndrome antibods(ssa + ssb)     Status: None   Collection Time: 02/22/17 10:51 AM  Result Value Ref Range   ENA SSA (RO) Ab <0.2 0.0 - 0.9 AI   ENA SSB (LA) Ab <0.2 0.0 - 0.9 AI  Pan-ANCA     Status: None   Collection Time: 02/22/17 10:51 AM  Result Value Ref Range   Myeloperoxidase Ab <9.0 0.0 - 9.0 U/mL   ANCA Proteinase 3 <3.5 0.0 - 3.5 U/mL   C-ANCA <1:20 Neg:<1:20 titer   P-ANCA <1:20 Neg:<1:20 titer    Comment: The presence of positive fluorescence exhibiting P-ANCA or C-ANCA patterns alone is not specific for the diagnosis of Wegener's Granulomatosis (WG) or microscopic polyangiitis. Decisions about treatment should not be based solely on ANCA IFA results.  The International ANCA Group Consensus recommends follow up testing of positive sera with both PR-3 and MPO-ANCA enzyme immunoassays. As many as 5% serum samples are positive only by EIA. Ref. AM J Clin Pathol 1999;111:507-513.    Atypical pANCA <1:20 Neg:<1:20 titer    Comment: The atypical pANCA pattern has been observed in a significant percentage of patients with ulcerative colitis, primary sclerosing cholangitis and autoimmune hepatitis.   TSH     Status: None    Collection Time: 02/22/17 10:51 AM  Result Value Ref Range   TSH 2.630 0.450 - 4.500 uIU/mL  Sedimentation rate     Status: None   Collection Time: 02/22/17 10:51 AM  Result Value Ref Range   Sed Rate 2 0 - 30 mm/hr  ANA w/Reflex     Status: None   Collection Time: 02/22/17 10:51 AM  Result Value Ref Range   Anit Nuclear Antibody(ANA) Negative Negative  B12 and Folate Panel     Status: None   Collection Time: 02/22/17 10:51 AM  Result Value Ref Range   Vitamin B-12 493 232 - 1,245 pg/mL   Folate >20.0 >3.0 ng/mL    Comment: A serum folate concentration of less than 3.1 ng/mL is considered to represent clinical deficiency.   RPR     Status: None   Collection Time: 02/22/17 10:51 AM  Result Value Ref Range   RPR Ser Ql Non Reactive Non Reactive  Hepatitis C antibody     Status: None  Collection Time: 02/22/17 10:51 AM  Result Value Ref Range   Hep C Virus Ab <0.1 0.0 - 0.9 s/co ratio    Comment:                                   Negative:     < 0.8                              Indeterminate: 0.8 - 0.9                                   Positive:     > 0.9  The CDC recommends that a positive HCV antibody result  be followed up with a HCV Nucleic Acid Amplification  test (834196).   Rheumatoid factor     Status: None   Collection Time: 02/22/17 10:51 AM  Result Value Ref Range   Rhuematoid fact SerPl-aCnc <10.0 0.0 - 13.9 IU/mL  Heavy metals, blood     Status: None   Collection Time: 02/22/17 10:51 AM  Result Value Ref Range   Lead, Blood None Detected 0 - 4 ug/dL    Comment: Testing performed by Induct[employer] coupled Estate manager/land agent.                           Environmental Exposure:                            WHO Recommendation    <20                           Occupational Exposure:                            OSHA Lead Std          40                            BEI                    30                                 Detection Limit =  1 This test was  developed and its performance characteristics determined by [facility]. It has not been cleared or approved by the Food and Drug Administration.    Arsenic 11 2 - 23 ug/L    Comment:                                 Detection Limit = 1   Mercury None Detected 0.0 - 14.9 ug/L    Comment:                         Environmental Exposure:  <15.0                         Occupational Exposure:  BEI - Inorganic Mercury: 15.0                                 Detection Limit =  1.0   Vitamin B6     Status: None   Collection Time: 02/22/17 10:51 AM  Result Value Ref Range   Vitamin B6 17.5 5.3 - 46.7 ug/L    Comment: This test was developed and its performance characteristics determined by [facility]. It has not been cleared or approved by the Food and Drug Administration.   Multiple Myeloma Panel (SPEP&IFE w/QIG)     Status: None (Preliminary result)   Collection Time: 02/22/17 10:51 AM  Result Value Ref Range   IgG (Immunoglobin G), Serum WILL FOLLOW    IgA/Immunoglobulin A, Serum WILL FOLLOW    IgM (Immunoglobulin M), Srm WILL FOLLOW    Total Protein WILL FOLLOW    Albumin SerPl Elph-Mcnc WILL FOLLOW    Alpha 1 WILL FOLLOW    Alpha2 Glob SerPl Elph-Mcnc WILL FOLLOW    B-Globulin SerPl Elph-Mcnc WILL FOLLOW    Gamma Glob SerPl Elph-Mcnc WILL FOLLOW    M Protein SerPl Elph-Mcnc WILL FOLLOW    Globulin, Total WILL FOLLOW    Albumin/Glob SerPl WILL FOLLOW    IFE 1 WILL FOLLOW    Please Note WILL FOLLOW    PDF WILL FOLLOW   B. burgdorfi Antibody     Status: None   Collection Time: 02/22/17 10:51 AM  Result Value Ref Range   Lyme IgG/IgM Ab <0.91 0.00 - 0.90 ISR    Comment:                                 Negative         <0.91                                 Equivocal  0.91 - 1.09                                 Positive         >1.09   Hemoglobin A1c     Status: None   Collection Time: 02/22/17 10:51 AM  Result Value Ref Range   Hgb A1c MFr Bld 5.4 4.8 - 5.6 %     Comment:          Prediabetes: 5.7 - 6.4          Diabetes: >6.4          Glycemic control for adults with diabetes: <7.0    Est. average glucose Bld gHb Est-mCnc 108 mg/dL  Methylmalonic acid, serum     Status: None   Collection Time: 02/22/17 10:51 AM  Result Value Ref Range   Methylmalonic Acid 136 0 - 378 nmol/L   Disclaimer: Comment     Comment: This test was developed and its performance characteristics determined by [facility]. It has not been cleared or approved by the Food and Drug Administration.   Vitamin B1     Status: Abnormal   Collection Time: 02/22/17 10:51 AM  Result Value Ref Range   Thiamine 205.0 (H) 66.5 - 200.0 nmol/L    Comment: This test was developed and its performance characteristics determined by  [facility]. It has not been cleared or approved by the Food and Drug Administration.    Cc: Dr. Wray Kearns, MD  [facility] [address] [place], [place] [address]  Phone [phone] Fax [phone]  A total of 25 minutes was spent face-to-face with this patient. Over half this time was spent on counseling patient on the neuropathy diagnosis and different diagnostic and therapeutic options available.

## 2017-02-27 LAB — PAN-ANCA
ANCA Proteinase 3: <3.5 U/mL (ref 0.0–3.5)
Atypical pANCA: <1:20 {titer}

## 2017-02-27 LAB — MULTIPLE MYELOMA PANEL, SERUM
ALPHA2 GLOB SERPL ELPH-MCNC: 0.6 g/dL (ref 0.4–1.0)
Albumin SerPl Elph-Mcnc: 3.7 g/dL (ref 2.9–4.4)
Albumin/Glob SerPl: 1.4 (ref 0.7–1.7)
Alpha 1: 0.2 g/dL (ref 0.0–0.4)
B-Globulin SerPl Elph-Mcnc: 1.1 g/dL (ref 0.7–1.3)
Gamma Glob SerPl Elph-Mcnc: 0.9 g/dL (ref 0.4–1.8)
Globulin, Total: 2.8 g/dL (ref 2.2–3.9)
IGA/IMMUNOGLOBULIN A, SERUM: 136 mg/dL (ref 61–437)
IGM (IMMUNOGLOBULIN M), SRM: 27 mg/dL (ref 15–143)
IgG (Immunoglobin G), Serum: 911 mg/dL (ref 700–1600)
TOTAL PROTEIN: 6.5 g/dL (ref 6.0–8.5)

## 2017-02-27 LAB — HEAVY METALS, BLOOD
ARSENIC: 11 ug/L (ref 2–23)
LEAD, BLOOD: NOT DETECTED ug/dL (ref 0–4)
MERCURY: NOT DETECTED ug/L (ref 0.0–14.9)

## 2017-02-27 LAB — HEMOGLOBIN A1C
ESTIMATED AVERAGE GLUCOSE: 108 mg/dL
HEMOGLOBIN A1C: 5.4 % (ref 4.8–5.6)

## 2017-02-27 LAB — B12 AND FOLATE PANEL
Folate: >20 ng/mL (ref >3.0)
Vitamin B-12: 493 pg/mL (ref 232–1245)

## 2017-02-27 LAB — TSH: TSH: 2.63 u[IU]/mL (ref 0.450–4.500)

## 2017-02-27 LAB — B. BURGDORFI ANTIBODIES

## 2017-02-27 LAB — HEPATITIS C ANTIBODY

## 2017-02-27 LAB — VITAMIN B1: THIAMINE: 205 nmol/L — AB (ref 66.5–200.0)

## 2017-02-27 LAB — SJOGREN'S SYNDROME ANTIBODS(SSA + SSB)
ENA SSA (RO) Ab: <0.2 AI (ref 0.0–0.9)
ENA SSB (LA) Ab: <0.2 AI (ref 0.0–0.9)

## 2017-02-27 LAB — RPR: RPR Ser Ql: NONREACTIVE

## 2017-02-27 LAB — METHYLMALONIC ACID, SERUM: METHYLMALONIC ACID: 136 nmol/L (ref 0–378)

## 2017-02-27 LAB — ANA W/REFLEX: ANA: NEGATIVE

## 2017-02-27 LAB — SEDIMENTATION RATE: SED RATE: 2 mm/h (ref 0–30)

## 2017-02-27 LAB — RHEUMATOID FACTOR: Rhuematoid fact SerPl-aCnc: <10 IU/mL (ref 0.0–13.9)

## 2017-02-27 LAB — VITAMIN B6: Vitamin B6: 17.5 ug/L (ref 5.3–46.7)

## 2017-02-27 LAB — ANGIOTENSIN CONVERTING ENZYME: Angio Convert Enzyme: 32 U/L (ref 14–82)

## 2017-02-27 NOTE — Addendum Note (Signed)
Addended by: Sarina Ill B on: 02/27/2017 08:26 AM   Modules accepted: Orders

## 2017-02-27 NOTE — Procedures (Signed)
Full Name: Roberto Rosales Gender: Male MRN #: [id number] Date of Birth: 2046/02/09    Visit Date: 02/22/17 08:53 Age: 71 Years 61 Months Old Examining Physician: Sarina Ill, MD  Referring Physician: Consuello Masse, MD  History: Patient with right hand pain and weakness. +Tinel's sign and Phalen's maneuver.  Summary: The right median motor distal peak latency was delayed (5.1 ms, N>4.4). The right ulnar motor distal peak latency was delayed (3.7 ms, N>3.3) with reduced amplitude (5.8 mV, N>6). The left ulnar motor distal peak latency was delayed (3.4 ms, N>3.3) with reduced amplitude (5.1 mV, N>6). The left peroneal and left tibial motor nerve showed no response. The right radial sensory nerve showed reduced amplitude (5 V, N>15). The left radial sensory nerve showed reduced amplitude (10 V, N>15).The left sural and left superficial peroneal sensory nerves showed no response. The right median/ulnar (palm) comparison nerve showed prolonged distal peak latency (Median Palm, 3.1 ms, N<2.2) and prolonged distal peak latency (ulnar palm, 2.6 ms, N<2.2) and abnormal peak latency difference (Median Palm-Ulnar Palm, 0.5 ms, N<0.4) with a relative median delay.  The left median/ulnar (palm) comparison nerve showed prolonged distal peak latency (Median Palm, 2.8 ms, N<2.2) and prolonged distal peak latency (ulnar palm, 2.6 ms, N<2.2) but normal peak latency difference (Median Palm-Ulnar Palm, 0.3 ms, N<0.4) with a relative median delay. All remaining nerves were within normal limits. All muscles were within normal limits.    Conclusion: Patient has right carpal tunnel syndrome. He also has concomitant length-dependent, axonal, sensorimotor polyneuropathy. Will refer to neurosurgery for Carpal Tunnel Release. Also discussed patient's polyneuropathy and will order a complete serum neuropathy panel for possible etiology.  A total of 15 minutes was spent in with this patient. Over half this time was spent  on counseling patient on the peripheral neuropathy diagnosis and different therapeutic options available. This does not include time spent on EMG/NCS diagnosing Carpal Tunnel Syndrome.   Orders Placed This Encounter  Procedures  . Angiotensin converting enzyme  . Sjogren's syndrome antibods(ssa + ssb)  . Pan-ANCA  . TSH  . Sedimentation rate  . ANA w/Reflex  . B12 and Folate Panel  . RPR  . Hepatitis C antibody  . Rheumatoid factor  . Heavy metals, blood  . Vitamin B6  . Multiple Myeloma Panel (SPEP&IFE w/QIG)  . B. burgdorfi Antibody  . Hemoglobin A1c  . Methylmalonic acid, serum  . Vitamin B1  . Ambulatory referral to Neurosurgery    Cc: Dr. Wray Kearns, M.D.  [facility] [place],  [address] Tel: [phone] Fax: [phone]        [facility]    Nerve / Sites Muscle Latency Ref. Amplitude Ref. Rel Amp Segments Distance Velocity Ref. Area    ms ms mV mV %  cm m/s m/s mVms  R Median - APB     Wrist APB 5.1 ?4.4 6.2 ?4.0 100 Wrist - APB 7   21.9     Upper arm APB 11.4  5.7  92.5 Upper arm - Wrist 26 41 ?49 19.9  L Median - APB     Wrist APB 4.4 ?4.4 4.8 ?4.0 100 Wrist - APB 7   17.1     Upper arm APB 10.5  4.3  89.4 Upper arm - Wrist 26 42 ?49 17.0  R Ulnar - ADM     Wrist ADM 3.7 ?3.3 5.8 ?6.0 100 Wrist - ADM 7   21.8  B.Elbow ADM 8.1  5.3  91.7 B.Elbow - Wrist 22 50 ?49 20.9     A.Elbow ADM 10.2  5.3  99.2 A.Elbow - B.Elbow 10 47 ?49 21.3         A.Elbow - Wrist      L Ulnar - ADM     Wrist ADM 3.4 ?3.3 5.1 ?6.0 100 Wrist - ADM 7   19.0     B.Elbow ADM 8.0  4.7  91.6 B.Elbow - Wrist 22 48 ?49 18.3     A.Elbow ADM 10.2  4.5  97.3 A.Elbow - B.Elbow 10 47 ?49 18.4         A.Elbow - Wrist      L Peroneal - EDB     Ankle EDB NR ?6.5 NR ?2.0 NR Ankle - EDB 9   NR     Fib head EDB NR  NR  NR Fib head - Ankle   ?44 NR         Pop fossa - Ankle      L Tibial - AH     Ankle AH NR ?5.8 NR ?4.0 NR Ankle - AH 9   NR  L  Peroneal - Tib Ant     Fib Head Tib Ant 3.8 ?6.7 3.6 ?3.0 100 Fib Head - Tib Ant 10   22.4     Pop fossa Tib Ant 6.3  3.5  98.1 Pop fossa - Fib Head 10 40 ?44 22.1                       SNC    Nerve / Sites Rec. Site Peak Lat Ref.  Amp Ref. Segments Distance Peak Diff Ref.    ms ms V V  cm ms ms  R Radial - Anatomical snuff box (Forearm)     Forearm Wrist 2.9 ?2.9 5 ?15 Forearm - Wrist 10    L Radial - Anatomical snuff box (Forearm)     Forearm Wrist 2.8 ?2.9 10 ?15 Forearm - Wrist 10    L Sural - Ankle (Calf)     Calf Ankle NR ?4.4 NR ?6 Calf - Ankle 14    L Superficial peroneal - Ankle     Lat leg Ankle NR ?4.4 NR ?6 Lat leg - Ankle 14    R Median, Ulnar - Transcarpal comparison     Median Palm Wrist 3.1 ?2.2 9 ?35 Median Palm - Wrist 8       Ulnar Palm Wrist 2.6 ?2.2 8 ?12 Ulnar Palm - Wrist 8          Median Palm - Ulnar Palm  0.5 ?0.4  L Median, Ulnar - Transcarpal comparison     Median Palm Wrist 2.8 ?2.2 10 ?35 Median Palm - Wrist 8       Ulnar Palm Wrist 2.6 ?2.2 6 ?12 Ulnar Palm - Wrist 8          Median Palm - Ulnar Palm  0.3 ?0.4  R Median - Orthodromic (Dig II, Mid palm)     Dig II Wrist 4.4 ?3.4 3 ?10 Dig II - Wrist 13    L Median - Orthodromic (Dig II, Mid palm)     Dig II Wrist 3.9 ?3.4 6 ?10 Dig II - Wrist 13    R Ulnar - Orthodromic, (Dig V, Mid palm)     Dig V Wrist 3.2 ?3.1 3 ?5 Dig V - Wrist 11    L  Ulnar - Orthodromic, (Dig V, Mid palm)     Dig V Wrist 3.2 ?3.1 5 ?5 Dig V - Wrist 24                           F  Wave    Nerve F Lat Ref.   ms ms  R Ulnar - ADM 36.0 ?32.0  L Ulnar - ADM 36.4 ?32.0         EMG full       EMG Summary Table    Spontaneous MUAP Recruitment  Muscle IA Fib PSW Fasc Other Amp Dur. Poly Pattern  R. Deltoid Normal None None None _______ Normal Normal Normal Normal  R. Triceps brachii Normal None None None _______ Normal Normal Normal Normal  R. Pronator teres Normal None None None _______ Normal Normal Normal Normal  R.  First dorsal interosseous Normal None None None _______ Normal Normal Normal Normal  R. Opponens pollicis Normal None None None _______ Normal Normal Normal Normal  R. Cervical paraspinals (low) Normal None None None _______ Normal Normal Normal Normal

## 2017-03-06 ENCOUNTER — Ambulatory Visit (HOSPITAL_COMMUNITY): Payer: PPO | Attending: Neurology

## 2017-03-06 ENCOUNTER — Encounter (HOSPITAL_COMMUNITY): Payer: Self-pay

## 2017-03-06 DIAGNOSIS — R29898 Other symptoms and signs involving the musculoskeletal system: Secondary | ICD-10-CM

## 2017-03-06 DIAGNOSIS — M21371 Foot drop, right foot: Secondary | ICD-10-CM | POA: Diagnosis not present

## 2017-03-06 DIAGNOSIS — M6281 Muscle weakness (generalized): Secondary | ICD-10-CM | POA: Insufficient documentation

## 2017-03-06 DIAGNOSIS — R2689 Other abnormalities of gait and mobility: Secondary | ICD-10-CM | POA: Diagnosis not present

## 2017-03-06 DIAGNOSIS — Z7409 Other reduced mobility: Secondary | ICD-10-CM | POA: Insufficient documentation

## 2017-03-06 NOTE — Therapy (Addendum)
[place], [place], [address] Phone: [phone]   Fax:  [phone]  Physical Therapy Evaluation  Patient Details  Name: Roberto Rosales MRN: [id number] Date of Birth: 06-15-1945 No Data Recorded  Encounter Date: 03/06/2017      PT End of Session - 03/06/17 1148    Visit Number 1   Number of Visits 9   Date for PT Re-Evaluation 04/03/17   Authorization Type [employer]   Authorization Time Period 03/06/2017-04/06/17   PT Start Time 0945   PT Stop Time 1030   PT Time Calculation (min) 45 min   Activity Tolerance Patient tolerated treatment well   Behavior During Therapy [facility] for tasks assessed/performed      Past Medical History:  Diagnosis Date  . Glaucoma   . High cholesterol     Past Surgical History:  Procedure Laterality Date  . CATARACT EXTRACTION Bilateral 2007  . TONSILLECTOMY  1950    There were no vitals filed for this visit.       Subjective Assessment - 03/06/17 0946    Subjective Patient notes that he had numbness in both feet about 10 years and a year ago started having Rt. foot drop. Patient notes he has neuropathy confirmed from nerve conduction test. He had back surgery in June this year, however, was encouraged that it would take about a year to a year in a half secondary to the chronicity of nerve damage.  Patient notes that he looked into a brace but thinks it may be too combursome with driving and felt restricted.    Pertinent History Lumbar surgery June 2018 laminectomy; neuropathy    Limitations Walking;House hold activities   How long can you stand comfortably? unlimited   How long can you walk comfortably? unlimited; slowed down and balance is off    Diagnostic tests Nerve conduction test - confirmed neuropathy unknown cause   Patient Stated Goals To improve balance and strength for foot drop    Currently in Pain? No/denies            [facility] PT Assessment -  03/06/17 0001      Assessment   Prior Therapy None for foot      Balance Screen   Has the patient fallen in the past 6 months No   Has the patient had a decrease in activity level because of a fear of falling?  No   Is the patient reluctant to leave their home because of a fear of falling?  No     Home Environment   Living Environment Private residence   Type of [place] Two level   Home Equipment None   Additional Comments Walking slow around home no problem ; no problem with stairs in home to second story     Prior Function   Level of Independence Independent   Leisure [name], works in yard     Cognition   Overall Cognitive Status Within Functional Limits for tasks assessed     Sensation   Light Touch Impaired by gross assessment   Proprioception Impaired by gross assessment     Functional Tests   Functional tests --     Posture/Postural Control   Posture Comments Rounded shoulders, forward head     AROM   Right Ankle Dorsiflexion 0   Right Ankle Plantar Flexion 55   Right Ankle Inversion 30   Right Ankle Eversion 30  Strength   Overall Strength Comments Bilateral glute med 4-/5   Right Knee Flexion 5/5   Right Knee Extension 5/5   Left Knee Flexion 5/5   Left Knee Extension 4+/5   Right Ankle Dorsiflexion 4-/5   Right Ankle Plantar Flexion 4+/5  seated   Right Ankle Inversion 4-/5   Right Ankle Eversion 4-/5   Left Ankle Dorsiflexion 4+/5   Left Ankle Plantar Flexion 5/5  seated   Left Ankle Inversion 4/5   Left Ankle Eversion 4/5     Bed Mobility   Bed Mobility Not assessed     Transfers   Five time sit to stand comments  15 seconds, no UE assist     Ambulation/Gait   Ambulation Distance (Feet) 673 Feet  3MWT   Assistive device None   Gait Pattern Trendelenburg;Decreased dorsiflexion - right   Gait velocity 1.14 m/s   Gait Comments Decreased eccentric control Rt. ankle presented with foot slap     Static Standing Balance    Static Standing - Balance Support No upper extremity supported   Static Standing - Level of Assistance 5: Stand by assistance   Static Standing Balance -  Activities  Single Leg Stance - Left Leg;Single Leg Stance - Right Leg;Tandam Stance - Right Leg;Tandam Stance - Left Leg   Static Standing - Comment/# of Minutes 3-5 seconds, LOB noted; significant ankle strategies bilateral; moderate trunk sway     Standardized Balance Assessment   10 Meter Walk 1.11 m/s     Timed Up and Go Test   TUG Normal TUG   Normal TUG (seconds) 12            Objective measurements completed on examination: See above findings.                  PT Education - 03/06/17 1147    Education provided Yes   Education Details Examination findings and direction of POC. Significance of balance and strengthening for ankle stability and decrease risk of falling. HEP see instructions    Person(s) Educated Patient   Methods Explanation;Demonstration;Handout   Comprehension Verbalized understanding;Returned demonstration          PT Short Term Goals - 03/06/17 1211      PT SHORT TERM GOAL #1   Title Patient will be independent with HEP to improve functional strength and balance gains.    Time 2   Period Weeks   Status New     PT SHORT TERM GOAL #2   Title Patient will present with improved hip muscle strength by at least 1 grade assessed by MMT.    Time 2   Period Weeks   Status New     PT SHORT TERM GOAL #3   Title Patient will be able to maintain single leg stance for at least 5 seconds bilaterally to decrease risk of falls with minimal trunk sway.    Time 2   Period Weeks   Status New           PT Long Term Goals - 03/06/17 1213      PT LONG TERM GOAL #1   Title Patient will be able to complete TUG functional assessment in < 12 seconds to improve safety during ambulation.    Time 4   Period Weeks   Status New     PT LONG TERM GOAL #2   Title Patient will improve 5 time STS  testing by at least 2 seconds to improve functional  lower extremity strength.    Time 4   Period Weeks   Status New     PT LONG TERM GOAL #3   Title Patient will ambulate 3MWT of at least 750 feet with improve hip and ankle control and stability.    Time 4   Period Weeks   Status New     PT LONG TERM GOAL #4   Title Patient will have improve Rt. ankle strength by at least 1 MMT grade to improve functional strength and safety during ambulation.    Time 4   Period Weeks   Status New                Plan - 03/06/17 1151    Clinical Impression Statement Patient is a pleasant 71 year old male presenting to OPPT with symptoms of Rt. Foot drop secondary to idiopathic neuropathy. He denies any decrease in activity or confidence leaving his home, and declines any recent falls or close calls. He reports to maintain an active lifestyle around his home, in the community and at the gym; however, reports that he decreases his ambulation speed to make sure his right foot does not cause him to trip and fall. Patient has decreased Rt. Ankle strength, decreased proprioception, decreased sensation and decreased balance all increasing his risk of falling. He will benefit from skilled PT address the above mention impairments to improve strength, gait speed, balance and confidence during ambulation.    History and Personal Factors relevant to plan of care: Lumbar laminectomy June 2018   Clinical Presentation Stable   Clinical Presentation due to: (+) active lifestyle, motivation to improve (-) chornicity of current symptoms    Clinical Decision Making Low   Rehab Potential Good   PT Frequency 2x / week   PT Duration 4 weeks   PT Treatment/Interventions ADLs/Self Care Home Management;Electrical Stimulation;DME Instruction;Gait training;Stair training;Functional mobility training;Therapeutic exercise;Balance training;Therapeutic activities;Neuromuscular re-education;Manual techniques;Energy  conservation;Patient/family education;Taping   PT Next Visit Plan Review Initial evaluation and goals, HEP; Balance focus, ankle proprioception (BAPs), strengthening.    PT Home Exercise Plan Eval: DF with theraband, against gravity inversion, eversion in sidelying, seated DF hold    Consulted and Agree with Plan of Care Patient       03/06/17 1449  Functional Assessment Tool Used (Outpatient Only) Functional status decided upon PT evaluation of functional tests 5xSTS, TUG, and SLS   Functional Limitation Mobility: Walking and moving around  Mobility: Walking and Moving Around Current Status [phone]) At least 40 percent but less than 60 percent impaired, limited or restricted  Mobility: Walking and Moving Around Goal Status [phone]) At least 20 percent but less than 40 percent impaired, limited or restricted  Mobility: Walking and Moving Around Discharge Status [phone])      Patient will benefit from skilled therapeutic intervention in order to improve the following deficits and impairments:  Abnormal gait, Decreased balance, Decreased endurance, Decreased coordination, Decreased strength, Decreased mobility, Difficulty walking, Postural dysfunction  Visit Diagnosis: Foot drop, right  Ankle weakness  Impaired functional mobility, balance, and endurance  Functional gait abnormality     Problem List There are no active problems to display for this patient.  Starr Lake PT, DPT 12:18 PM, 03/06/17 [place], [place], [address] Phone: [phone]   Fax:  [phone]  Name: MERCER PEIFER MRN: [id number] Date of Birth: [birth year]

## 2017-03-06 NOTE — Patient Instructions (Signed)
  TOES RAISES - DORSIFLEXION - SINGLE  15 reps, 3 sets, 5 second hold   Start with your entire foot on the ground. Next, raise up your forefoot and toes as you bend your ankle.  Keep your heels on the ground the entire time.    Ankle Dorsiflexion with Elastic Band Green band, 10 reps, 2 sets, 5 second hold Place the elastic band over the ball of the foot. Step on the band with the GOOD foot. Lift the involved toes and foot up .     SIDELYING INVERSION 10 reps, 5 second hold, 2 sets  Start by lying on your side with the target ankle on the bottom. You should be lying so that your foot and ankle are off the edge of the bed or table. Next, move your ankle and foot upwards towards the ceiling as shown.       SIDELYING EVERSION 10 reps, 5 second hold, 2 sets  Start by lying on your side with the target ankle on top. You should be lying so that your foot and ankle are off the edge of the bed or table. Next, move your ankle and foot upwards towards the ceiling as shown.

## 2017-03-07 ENCOUNTER — Telehealth (HOSPITAL_COMMUNITY): Payer: Self-pay | Admitting: Family Medicine

## 2017-03-07 NOTE — Telephone Encounter (Signed)
03/07/17 the 12/5 was moved back... Patient made a error so I cancelled the 12/6  appt

## 2017-03-07 NOTE — Telephone Encounter (Signed)
03/07/17  pt called and needed to reschedule 12/5 appt.... it was RS for 12/6

## 2017-03-13 ENCOUNTER — Ambulatory Visit (HOSPITAL_COMMUNITY): Payer: PPO | Attending: Neurology

## 2017-03-13 ENCOUNTER — Encounter (HOSPITAL_COMMUNITY): Payer: Self-pay

## 2017-03-13 DIAGNOSIS — M21371 Foot drop, right foot: Secondary | ICD-10-CM

## 2017-03-13 DIAGNOSIS — M6281 Muscle weakness (generalized): Secondary | ICD-10-CM | POA: Insufficient documentation

## 2017-03-13 DIAGNOSIS — Z7409 Other reduced mobility: Secondary | ICD-10-CM | POA: Diagnosis not present

## 2017-03-13 DIAGNOSIS — R2689 Other abnormalities of gait and mobility: Secondary | ICD-10-CM | POA: Insufficient documentation

## 2017-03-13 DIAGNOSIS — R29898 Other symptoms and signs involving the musculoskeletal system: Secondary | ICD-10-CM

## 2017-03-13 NOTE — Therapy (Signed)
[place], [place], [address] Phone: [phone]   Fax:  [phone]  Physical Therapy Treatment  Patient Details  Name: Roberto Rosales MRN: [id number] Date of Birth: 07-17-1945 No Data Recorded  Encounter Date: 03/13/2017  PT End of Session - 03/13/17 1119    Visit Number  2    Number of Visits  9    Date for PT Re-Evaluation  04/03/17    Authorization Type  [employer]    Authorization Time Period  03/06/2017-04/06/17    PT Start Time  1030    PT Stop Time  1118    PT Time Calculation (min)  48 min    Activity Tolerance  Patient tolerated treatment well    Behavior During Therapy  [facility] for tasks assessed/performed       Past Medical History:  Diagnosis Date  . Glaucoma   . High cholesterol     Past Surgical History:  Procedure Laterality Date  . CATARACT EXTRACTION Bilateral 2007  . TONSILLECTOMY  1950    There were no vitals filed for this visit.  Subjective Assessment - 03/13/17 1030    Subjective  Patient notes continued numbness and denies pain. Notes able to complete HEP well.                       [facility] Adult PT Treatment/Exercise - 03/13/17 0001      Ankle Exercises: Seated   Heel Raises  20 reps    Toe Raise  20 reps 3#   3#   BAPS  Sitting;10 reps assistance knee with eversion   assistance knee with eversion   Heel Slides  10 reps DF with 2# cone 10 and 2 oclock   DF with 2# cone 10 and 2 oclock   Other Seated Ankle Exercises  Simulate gas to break 2# x 15    Other Seated Ankle Exercises  Eccentric PF cane assist 2# x 10      Ankle Exercises: Standing   BAPS  Level 2;10 reps    SLS  10 seconds x 3 each LE    Rebounder  Lunge on dyna disc x 10    Other Standing Ankle Exercises  lateral ambulation, backward ambulation 2 RT.                PT Short Term Goals - 03/06/17 1211      PT SHORT TERM GOAL #1   Title  Patient will be independent with  HEP to improve functional strength and balance gains.     Time  2    Period  Weeks    Status  New      PT SHORT TERM GOAL #2   Title  Patient will present with improved hip muscle strength by at least 1 grade assessed by MMT.     Time  2    Period  Weeks    Status  New      PT SHORT TERM GOAL #3   Title  Patient will be able to maintain single leg stance for at least 5 seconds bilaterally to decrease risk of falls with minimal trunk sway.     Time  2    Period  Weeks    Status  New        PT Long Term Goals - 03/06/17 1213      PT LONG TERM GOAL #1   Title  Patient will  be able to complete TUG functional assessment in < 12 seconds to improve safety during ambulation.     Time  4    Period  Weeks    Status  New      PT LONG TERM GOAL #2   Title  Patient will improve 5 time STS testing by at least 2 seconds to improve functional lower extremity strength.     Time  4    Period  Weeks    Status  New      PT LONG TERM GOAL #3   Title  Patient will ambulate 3MWT of at least 750 feet with improve hip and ankle control and stability.     Time  4    Period  Weeks    Status  New      PT LONG TERM GOAL #4   Title  Patient will have improve Rt. ankle strength by at least 1 MMT grade to improve functional strength and safety during ambulation.     Time  4    Period  Weeks    Status  New            Plan - 03/13/17 1120    Clinical Impression Statement  Started today's session with reviewing initial evaluation, goals and verbal reviewal of HEP. Transitioned patient into DF strengthening, ankle proprioception exercises, and dynamic balance therex. Overall, tolerated session well with continued challenge with balance exercise presenting with LOB frequently within BOS to recover. Plan to increase weight with DF exercises next session.     Rehab Potential  Good    PT Frequency  2x / week    PT Duration  4 weeks    PT Treatment/Interventions  ADLs/Self Care Home  Management;Electrical Stimulation;DME Instruction;Gait training;Stair training;Functional mobility training;Therapeutic exercise;Balance training;Therapeutic activities;Neuromuscular re-education;Manual techniques;Energy conservation;Patient/family education;Taping    PT Next Visit Plan  Continue heavy focus on Balance, ankle proprioception, functional DF strengthening. Increase weight and add towel Inv, Ev with weight next session.     PT Home Exercise Plan  Eval: DF with theraband, against gravity inversion, eversion in sidelying, seated DF hold     Consulted and Agree with Plan of Care  Patient       Patient will benefit from skilled therapeutic intervention in order to improve the following deficits and impairments:  Abnormal gait, Decreased balance, Decreased endurance, Decreased coordination, Decreased strength, Decreased mobility, Difficulty walking, Postural dysfunction  Visit Diagnosis: Foot drop, right  Ankle weakness  Impaired functional mobility, balance, and endurance  Functional gait abnormality     Problem List There are no active problems to display for this patient.  Starr Lake PT, DPT 11:27 AM, 03/13/17 [place], [place], [address] Phone: [phone]   Fax:  [phone]  Name: Roberto Rosales MRN: [id number] Date of Birth: 05-24-1945

## 2017-03-14 ENCOUNTER — Encounter (HOSPITAL_COMMUNITY): Payer: Self-pay

## 2017-03-14 ENCOUNTER — Ambulatory Visit (HOSPITAL_COMMUNITY): Payer: PPO

## 2017-03-14 DIAGNOSIS — Z7409 Other reduced mobility: Secondary | ICD-10-CM

## 2017-03-14 DIAGNOSIS — M21371 Foot drop, right foot: Secondary | ICD-10-CM | POA: Diagnosis not present

## 2017-03-14 DIAGNOSIS — R2689 Other abnormalities of gait and mobility: Secondary | ICD-10-CM

## 2017-03-14 DIAGNOSIS — R29898 Other symptoms and signs involving the musculoskeletal system: Secondary | ICD-10-CM

## 2017-03-14 NOTE — Therapy (Signed)
[place], [place], [address] Phone: [phone]   Fax:  [phone]  Physical Therapy Treatment  Patient Details  Name: Roberto Rosales MRN: [id number] Date of Birth: 05-07-1946 No Data Recorded  Encounter Date: 03/14/2017  PT End of Session - 03/14/17 1035    Visit Number  3    Number of Visits  9    Date for PT Re-Evaluation  04/03/17    Authorization Type  [employer]    Authorization Time Period  03/06/2017-04/06/17    PT Start Time  0945    PT Stop Time  1030    PT Time Calculation (min)  45 min    Activity Tolerance  Patient tolerated treatment well    Behavior During Therapy  [facility] for tasks assessed/performed       Past Medical History:  Diagnosis Date  . Glaucoma   . High cholesterol     Past Surgical History:  Procedure Laterality Date  . CATARACT EXTRACTION Bilateral 2007  . TONSILLECTOMY  1950    There were no vitals filed for this visit.  Subjective Assessment - 03/14/17 0950    Subjective  Patient notes that he felt okay yesterday and has continued with his normal workout routine thismorning.     Currently in Pain?  No/denies                      [facility] Adult PT Treatment/Exercise - 03/14/17 0001      Ankle Exercises: Seated   Towel Inversion/Eversion  3 reps;Other (comment) 5# weight; assist knee control at knee above ankle   5# weight; assist knee control at knee above ankle   [place]  15 x Simulate gas to break 3# weight assisted full range DF targets on baps    Heel Raises  20 reps 4# at ankle   4# at ankle   Toe Raise  20 reps 3# assisted    3# assisted    BAPS  Sitting;10 reps    Other Seated Ankle Exercises  15x Assisted DF with 3# on 4 inch step toe tapping    Other Seated Ankle Exercises  Eccentric PF cane assist 3# x 15      Ankle Exercises: Standing   BAPS  Level 2;15 reps    Balance Beam  Tandem on foam 2x 30 sec each    Other Standing  Ankle Exercises  lateral ambulation (3# weight on toes), backward ambulation 2 RT.     Other Standing Ankle Exercises  12" box toe tap with 3# x 15             PT Education - 03/13/17 1128    Education provided  Yes    Education Details  Discussed ankle orthotic with dynamic hinge as possibility for driving, with encouragement to focus on strengthening first to improve safety    Person(s) Educated  Patient    Methods  Explanation    Comprehension  Verbalized understanding       PT Short Term Goals - 03/06/17 1211      PT SHORT TERM GOAL #1   Title  Patient will be independent with HEP to improve functional strength and balance gains.     Time  2    Period  Weeks    Status  New      PT SHORT TERM GOAL #2   Title  Patient will present with improved hip muscle strength  by at least 1 grade assessed by MMT.     Time  2    Period  Weeks    Status  New      PT SHORT TERM GOAL #3   Title  Patient will be able to maintain single leg stance for at least 5 seconds bilaterally to decrease risk of falls with minimal trunk sway.     Time  2    Period  Weeks    Status  New        PT Long Term Goals - 03/06/17 1213      PT LONG TERM GOAL #1   Title  Patient will be able to complete TUG functional assessment in < 12 seconds to improve safety during ambulation.     Time  4    Period  Weeks    Status  New      PT LONG TERM GOAL #2   Title  Patient will improve 5 time STS testing by at least 2 seconds to improve functional lower extremity strength.     Time  4    Period  Weeks    Status  New      PT LONG TERM GOAL #3   Title  Patient will ambulate 3MWT of at least 750 feet with improve hip and ankle control and stability.     Time  4    Period  Weeks    Status  New      PT LONG TERM GOAL #4   Title  Patient will have improve Rt. ankle strength by at least 1 MMT grade to improve functional strength and safety during ambulation.     Time  4    Period  Weeks    Status  New             Plan - 03/14/17 1035    Clinical Impression Statement  Session focused primarily on DF strengthening and ankle proprioception. Patient voiced challenge today with increase in ankle weight and showed continued good range with increased resistance. Therapist started manual assistance throughout strengthening exercises after pt completed available range. He showed good foot clearance and active DF post session.      Rehab Potential  Good    PT Frequency  2x / week    PT Duration  4 weeks    PT Treatment/Interventions  ADLs/Self Care Home Management;Electrical Stimulation;DME Instruction;Gait training;Stair training;Functional mobility training;Therapeutic exercise;Balance training;Therapeutic activities;Neuromuscular re-education;Manual techniques;Energy conservation;Patient/family education;Taping    PT Next Visit Plan  Continue heavy focus on Balance, ankle proprioception, functional DF strengthening.     PT Home Exercise Plan  Eval: DF with theraband, against gravity inversion, eversion in sidelying, seated DF hold     Consulted and Agree with Plan of Care  Patient       Patient will benefit from skilled therapeutic intervention in order to improve the following deficits and impairments:  Abnormal gait, Decreased balance, Decreased endurance, Decreased coordination, Decreased strength, Decreased mobility, Difficulty walking, Postural dysfunction  Visit Diagnosis: Foot drop, right  Ankle weakness  Impaired functional mobility, balance, and endurance  Functional gait abnormality     Problem List There are no active problems to display for this patient.  Starr Lake PT, DPT 10:36 AM, 03/14/17 [place], [place], [address] Phone: [phone]   Fax:  [phone]  Name: Roberto Rosales MRN: [id number] Date of Birth: 1945-12-09

## 2017-03-15 DIAGNOSIS — G5601 Carpal tunnel syndrome, right upper limb: Secondary | ICD-10-CM | POA: Diagnosis not present

## 2017-03-15 DIAGNOSIS — Z6825 Body mass index (BMI) 25.0-25.9, adult: Secondary | ICD-10-CM | POA: Diagnosis not present

## 2017-03-15 DIAGNOSIS — R030 Elevated blood-pressure reading, without diagnosis of hypertension: Secondary | ICD-10-CM | POA: Diagnosis not present

## 2017-03-19 ENCOUNTER — Other Ambulatory Visit: Payer: Self-pay

## 2017-03-19 ENCOUNTER — Encounter (HOSPITAL_COMMUNITY): Payer: Self-pay | Admitting: Physical Therapy

## 2017-03-19 ENCOUNTER — Ambulatory Visit (HOSPITAL_COMMUNITY): Payer: PPO | Admitting: Physical Therapy

## 2017-03-19 DIAGNOSIS — M21371 Foot drop, right foot: Secondary | ICD-10-CM

## 2017-03-19 DIAGNOSIS — R29898 Other symptoms and signs involving the musculoskeletal system: Secondary | ICD-10-CM

## 2017-03-19 DIAGNOSIS — R2689 Other abnormalities of gait and mobility: Secondary | ICD-10-CM

## 2017-03-19 DIAGNOSIS — Z7409 Other reduced mobility: Secondary | ICD-10-CM

## 2017-03-19 NOTE — Therapy (Signed)
[place], [place], [address] Phone: [phone]   Fax:  [phone]  Physical Therapy Treatment  Patient Details  Name: Roberto Rosales MRN: [id number] Date of Birth: 06/11/1945 No Data Recorded  Encounter Date: 03/19/2017  PT End of Session - 03/19/17 1600    Visit Number  4    Number of Visits  9    Date for PT Re-Evaluation  04/03/17    Authorization Type  [employer]    Authorization Time Period  03/06/2017-04/06/17    Authorization - Visit Number  4    Authorization - Number of Visits  9    PT Start Time  5465    PT Stop Time  1600    PT Time Calculation (min)  46 min    Activity Tolerance  Patient tolerated treatment well    Behavior During Therapy  [facility] for tasks assessed/performed       Past Medical History:  Diagnosis Date  . Glaucoma   . High cholesterol     Past Surgical History:  Procedure Laterality Date  . CATARACT EXTRACTION Bilateral 2007  . TONSILLECTOMY  1950    There were no vitals filed for this visit.  Subjective Assessment - 03/19/17 1509    Subjective  Pt states that there is no pain he just has the drop foot; states that there has not been much change with this at this time.      Currently in Pain?  No/denies         [facility] Adult PT Treatment/Exercise - 03/19/17 0001      Exercises   Exercises  Ankle      Ankle Exercises: Stretches   Slant Board Stretch  3 reps;30 seconds      Ankle Exercises: Standing   BAPS  Level 3;10 reps    SLS  5x B  Rt:      Rocker Board  1 minute    Rocker Board Limitations  A/P and RT/LT     Heel Raises  15 reps    Toe Raise  -- x15    Balance Beam  Tandem on foam 2x 30 sec each      Ankle Exercises: Seated   Towel Crunch  3 reps    Towel Inversion/Eversion  3 reps;Weights    Towel Inversion/Eversion Weights (lbs)  2    Other Seated Ankle Exercises  sitting on cybex chest press push bar down with foot 2 Pl x 15                 PT Short Term Goals - 03/06/17 1211      PT SHORT TERM GOAL #1   Title  Patient will be independent with HEP to improve functional strength and balance gains.     Time  2    Period  Weeks    Status  New      PT SHORT TERM GOAL #2   Title  Patient will present with improved hip muscle strength by at least 1 grade assessed by MMT.     Time  2    Period  Weeks    Status  New      PT SHORT TERM GOAL #3   Title  Patient will be able to maintain single leg stance for at least 5 seconds bilaterally to decrease risk of falls with minimal trunk sway.     Time  2    Period  Weeks  Status  New        PT Long Term Goals - 03/06/17 1213      PT LONG TERM GOAL #1   Title  Patient will be able to complete TUG functional assessment in < 12 seconds to improve safety during ambulation.     Time  4    Period  Weeks    Status  New      PT LONG TERM GOAL #2   Title  Patient will improve 5 time STS testing by at least 2 seconds to improve functional lower extremity strength.     Time  4    Period  Weeks    Status  New      PT LONG TERM GOAL #3   Title  Patient will ambulate 3MWT of at least 750 feet with improve hip and ankle control and stability.     Time  4    Period  Weeks    Status  New      PT LONG TERM GOAL #4   Title  Patient will have improve Rt. ankle strength by at least 1 MMT grade to improve functional strength and safety during ambulation.     Time  4    Period  Weeks    Status  New            Plan - 03/19/17 1601    Clinical Impression Statement  Continue to focus on stretching and strengthening of pt Rt LE.  Pt continues to have significant weakness and stability in ankle.     Rehab Potential  Good    PT Frequency  2x / week    PT Duration  4 weeks    PT Treatment/Interventions  ADLs/Self Care Home Management;Electrical Stimulation;DME Instruction;Gait training;Stair training;Functional mobility training;Therapeutic exercise;Balance  training;Therapeutic activities;Neuromuscular re-education;Manual techniques;Energy conservation;Patient/family education;Taping    PT Next Visit Plan  Pt may benefit from [place] stimulation to improve DF strength.     PT Home Exercise Plan  Eval: DF with theraband, against gravity inversion, eversion in sidelying, seated DF hold     Consulted and Agree with Plan of Care  Patient       Patient will benefit from skilled therapeutic intervention in order to improve the following deficits and impairments:  Abnormal gait, Decreased balance, Decreased endurance, Decreased coordination, Decreased strength, Decreased mobility, Difficulty walking, Postural dysfunction  Visit Diagnosis: Foot drop, right  Ankle weakness  Impaired functional mobility, balance, and endurance  Functional gait abnormality     Problem List There are no active problems to display for this patient. Rayetta Humphrey, PT CLT [phone] 03/19/2017, 4:03 PM  [place] [address] [place], [place], [address] Phone: [phone]   Fax:  [phone]  Name: Roberto Rosales MRN: [id number] Date of Birth: 10/29/1945

## 2017-03-21 ENCOUNTER — Ambulatory Visit (HOSPITAL_COMMUNITY): Payer: PPO

## 2017-03-21 ENCOUNTER — Encounter (HOSPITAL_COMMUNITY): Payer: Self-pay

## 2017-03-21 DIAGNOSIS — Z7409 Other reduced mobility: Secondary | ICD-10-CM

## 2017-03-21 DIAGNOSIS — R2689 Other abnormalities of gait and mobility: Secondary | ICD-10-CM

## 2017-03-21 DIAGNOSIS — M21371 Foot drop, right foot: Secondary | ICD-10-CM

## 2017-03-21 DIAGNOSIS — R29898 Other symptoms and signs involving the musculoskeletal system: Secondary | ICD-10-CM

## 2017-03-21 NOTE — Therapy (Signed)
[place], [place], [address] Phone: [phone]   Fax:  [phone]  Physical Therapy Treatment  Patient Details  Name: Roberto Rosales MRN: [id number] Date of Birth: 09/13/1945 No Data Recorded  Encounter Date: 03/21/2017  PT End of Session - 03/21/17 1029    Visit Number  5    Number of Visits  9    Date for PT Re-Evaluation  04/03/17    Authorization Type  [employer]    Authorization Time Period  03/06/2017-04/06/17    Authorization - Visit Number  5    Authorization - Number of Visits  9    PT Start Time  0948    PT Stop Time  1028    PT Time Calculation (min)  40 min    Activity Tolerance  Patient tolerated treatment well    Behavior During Therapy  [facility] for tasks assessed/performed       Past Medical History:  Diagnosis Date  . Glaucoma   . High cholesterol     Past Surgical History:  Procedure Laterality Date  . CATARACT EXTRACTION Bilateral 2007  . TONSILLECTOMY  1950    There were no vitals filed for this visit.  Subjective Assessment - 03/21/17 1000    Subjective  Pt states no pain and continued foot drop. Not seeing much improvement or difference in gait. Doing HEP as much as he can.                       [facility] Adult PT Treatment/Exercise - 03/21/17 0001      Neuro Re-ed    Neuro Re-ed Details   Russian stem 10/20 anterior tibialis 2 pads, 54 mA 50% x 8 min      Ankle Exercises: Standing   BAPS  Level 3;10 reps    Heel Raises  15 reps    Toe Raise  15 reps manual assist to get full range     Balance Beam  Tandem on foam 2x 30 sec each    Other Standing Ankle Exercises  lateral ambulation (3# weight on toes), backward ambulation 2 RT.     Other Standing Ankle Exercises  12" box toe tap with 3# 2 sets x 15               PT Short Term Goals - 03/06/17 1211      PT SHORT TERM GOAL #1   Title  Patient will be independent with HEP to improve  functional strength and balance gains.     Time  2    Period  Weeks    Status  New      PT SHORT TERM GOAL #2   Title  Patient will present with improved hip muscle strength by at least 1 grade assessed by MMT.     Time  2    Period  Weeks    Status  New      PT SHORT TERM GOAL #3   Title  Patient will be able to maintain single leg stance for at least 5 seconds bilaterally to decrease risk of falls with minimal trunk sway.     Time  2    Period  Weeks    Status  New        PT Long Term Goals - 03/06/17 1213      PT LONG TERM GOAL #1   Title  Patient will be able to complete  TUG functional assessment in < 12 seconds to improve safety during ambulation.     Time  4    Period  Weeks    Status  New      PT LONG TERM GOAL #2   Title  Patient will improve 5 time STS testing by at least 2 seconds to improve functional lower extremity strength.     Time  4    Period  Weeks    Status  New      PT LONG TERM GOAL #3   Title  Patient will ambulate 3MWT of at least 750 feet with improve hip and ankle control and stability.     Time  4    Period  Weeks    Status  New      PT LONG TERM GOAL #4   Title  Patient will have improve Rt. ankle strength by at least 1 MMT grade to improve functional strength and safety during ambulation.     Time  4    Period  Weeks    Status  New            Plan - 03/21/17 1029    Clinical Impression Statement  Patient tolerated addition of Russian stimulation well, slight redness under patches along anterior tibialis mm on the Rt. PT informed patient that should resolve quickly but if continues or is irritating to inform treating therapist next session. Pt noted fatigue with step toe tap and lateral ambulation with weight this session. Continue to focus on strengthening and balance.     Rehab Potential  Good    PT Frequency  2x / week    PT Duration  4 weeks    PT Treatment/Interventions  ADLs/Self Care Home Management;Electrical Stimulation;DME  Instruction;Gait training;Stair training;Functional mobility training;Therapeutic exercise;Balance training;Therapeutic activities;Neuromuscular re-education;Manual techniques;Energy conservation;Patient/family education;Taping    PT Next Visit Plan  FU regarding initiating Russian stim    PT Home Exercise Plan  Eval: DF with theraband, against gravity inversion, eversion in sidelying, seated DF hold     Consulted and Agree with Plan of Care  Patient       Patient will benefit from skilled therapeutic intervention in order to improve the following deficits and impairments:  Abnormal gait, Decreased balance, Decreased endurance, Decreased coordination, Decreased strength, Decreased mobility, Difficulty walking, Postural dysfunction  Visit Diagnosis: Foot drop, right  Ankle weakness  Impaired functional mobility, balance, and endurance  Functional gait abnormality     Problem List There are no active problems to display for this patient.  Starr Lake PT, DPT 10:32 AM, 03/21/17 [place], [place], [address] Phone: [phone]   Fax:  [phone]  Name: Roberto Rosales MRN: [id number] Date of Birth: 1946-02-17

## 2017-03-26 ENCOUNTER — Ambulatory Visit (HOSPITAL_COMMUNITY): Payer: PPO

## 2017-03-26 ENCOUNTER — Encounter (HOSPITAL_COMMUNITY): Payer: Self-pay

## 2017-03-26 DIAGNOSIS — M21371 Foot drop, right foot: Secondary | ICD-10-CM | POA: Diagnosis not present

## 2017-03-26 DIAGNOSIS — R29898 Other symptoms and signs involving the musculoskeletal system: Secondary | ICD-10-CM

## 2017-03-26 DIAGNOSIS — Z7409 Other reduced mobility: Secondary | ICD-10-CM

## 2017-03-26 DIAGNOSIS — R2689 Other abnormalities of gait and mobility: Secondary | ICD-10-CM

## 2017-03-26 NOTE — Patient Instructions (Signed)
   TOE INTRINSICS  IN THE SITTING POSITION, pick up small object (marble, cotton ball, or similar object) and place within a cup. Hold the object for 5 second count before placing in container. Complete 20 times each foot.       Seated Foot Intrinsic  1) Sitting in a chair barefoot, feet and knees hip distance apart 2) Keep your big toes down, and lift your outer 4 toes. Don't allow your knees to move. Repeat 20 times. 3) Keep your outer 4 toes down, lift your big toe. Don't allow your knees to move. Repeat 20 times.  Perform 1-3 times/day.      ARCH LIFTS              Repeat 20 times, 5 second hold   Step 1: Sit in a chair with both feet placed flat on the floor Step 2: Raise the arch of your foot by sliding your big toe toward your heel without curling your toes or lifting your heel Step 3: Hold the position for 6 seconds then relax and repeat for the recommended number of set and repetitions. Variations can be performed by moving the feet farther away from you or turning the foot inward or outward to challenge the muscles from different positions. Step 4: Once you feel comfortable performing the short foot movement you can gradually progress to performing the exercise while standing and then eventually from a single-leg standing position.      TOWEL CURLS - TOWEL SCRUNCHES  While seated, use a towel and draw it back towards you using your toes. Curl your toes inward. Be sure to keep your heel in contact with the floor the entire time.  Repeat 20 scrunches    Toe curls  Sitting with your foot on the floor, curl toes and hold for 10 seconds then extend toes for 10 seconds.    Repeat 20 times    HEEL WALK  Raise up your toes and walk on your heels.  Take few steps forward and then a few steps backwards.     Repeat 20 times, 2 sets    TOE WALK  Raise up your heels and walk on your toes.  Take few steps forward and then a few steps backwards.     Repeat 20 times, 2 sets

## 2017-03-26 NOTE — Therapy (Signed)
[place], [place], [address] Phone: [phone]   Fax:  [phone]  Physical Therapy Treatment  Patient Details  Name: Roberto Rosales MRN: [id number] Date of Birth: 1945-11-12 No Data Recorded  Encounter Date: 03/26/2017  PT End of Session - 03/26/17 1027    Visit Number  6    Number of Visits  9    Date for PT Re-Evaluation  04/03/17    Authorization Type  [employer]    Authorization Time Period  03/06/2017-04/06/17    Authorization - Visit Number  6    Authorization - Number of Visits  9    PT Start Time  0945    PT Stop Time  1030    PT Time Calculation (min)  45 min    Activity Tolerance  Patient tolerated treatment well    Behavior During Therapy  [facility] for tasks assessed/performed       Past Medical History:  Diagnosis Date  . Glaucoma   . High cholesterol     Past Surgical History:  Procedure Laterality Date  . CATARACT EXTRACTION Bilateral 2007  . TONSILLECTOMY  1950    There were no vitals filed for this visit.  Subjective Assessment - 03/26/17 0955    Subjective  Patient states he did his exercises all week and has noticed no change.     Currently in Pain?  No/denies                      [facility] Adult PT Treatment/Exercise - 03/26/17 0001      Neuro Re-ed    Neuro Re-ed Details   [place] stem 10/10 anterior tibialis 2 pads, 54 mA 50% x 8 min with 3# weight       Ankle Exercises: Seated   Towel Crunch  Other (comment) 20 reps, 3s hold    Towel Inversion/Eversion  3 reps;Weights    Towel Inversion/Eversion Weights (lbs)  2    Toe Raise  20 reps      Ankle Exercises: Standing   BAPS  Level 3;10 reps    Rebounder  Lunge on dyna disc x 15    Heel Raises  20 reps    Balance Beam  tandem on half FR 2 x 30 seconds each    Other Standing Ankle Exercises  12" box toe tap with 3# 2 sets x 15             PT Education - 03/26/17 1013    Education provided   Yes    Education Details  Foot Intrinsic handout     Person(s) Educated  Patient    Methods  Handout;Explanation    Comprehension  Verbalized understanding       PT Short Term Goals - 03/06/17 1211      PT SHORT TERM GOAL #1   Title  Patient will be independent with HEP to improve functional strength and balance gains.     Time  2    Period  Weeks    Status  New      PT SHORT TERM GOAL #2   Title  Patient will present with improved hip muscle strength by at least 1 grade assessed by MMT.     Time  2    Period  Weeks    Status  New      PT SHORT TERM GOAL #3   Title  Patient will be able to maintain  single leg stance for at least 5 seconds bilaterally to decrease risk of falls with minimal trunk sway.     Time  2    Period  Weeks    Status  New        PT Long Term Goals - 03/06/17 1213      PT LONG TERM GOAL #1   Title  Patient will be able to complete TUG functional assessment in < 12 seconds to improve safety during ambulation.     Time  4    Period  Weeks    Status  New      PT LONG TERM GOAL #2   Title  Patient will improve 5 time STS testing by at least 2 seconds to improve functional lower extremity strength.     Time  4    Period  Weeks    Status  New      PT LONG TERM GOAL #3   Title  Patient will ambulate 3MWT of at least 750 feet with improve hip and ankle control and stability.     Time  4    Period  Weeks    Status  New      PT LONG TERM GOAL #4   Title  Patient will have improve Rt. ankle strength by at least 1 MMT grade to improve functional strength and safety during ambulation.     Time  4    Period  Weeks    Status  New            Plan - 03/26/17 1030    Clinical Impression Statement  Session started with continued re-education of anterior tibialis mm with [place] stimulation. Patient continues to present with good motivation to keep completing exercises, however, minimal progress has been made thus far. Patient continues to be  challenged by dynamic balance with overall trunk decreased control. Plan to reassess next session and to review HEP of intrinsic mm given today.     Rehab Potential  Good    PT Frequency  2x / week    PT Duration  4 weeks    PT Treatment/Interventions  ADLs/Self Care Home Management;Electrical Stimulation;DME Instruction;Gait training;Stair training;Functional mobility training;Therapeutic exercise;Balance training;Therapeutic activities;Neuromuscular re-education;Manual techniques;Energy conservation;Patient/family education;Taping    PT Next Visit Plan  Reassessment next session    PT Home Exercise Plan  Eval: DF with theraband, against gravity inversion, eversion in sidelying, seated DF hold; 11/19-intrinsics mm    Consulted and Agree with Plan of Care  Patient       Patient will benefit from skilled therapeutic intervention in order to improve the following deficits and impairments:  Abnormal gait, Decreased balance, Decreased endurance, Decreased coordination, Decreased strength, Decreased mobility, Difficulty walking, Postural dysfunction  Visit Diagnosis: Foot drop, right  Ankle weakness  Impaired functional mobility, balance, and endurance  Functional gait abnormality     Problem List There are no active problems to display for this patient.  Starr Lake PT, DPT 10:32 AM, 03/26/17 [place], [place], [address] Phone: [phone]   Fax:  [phone]  Name: MUHAMED LUECKE MRN: [id number] Date of Birth: August 29, 1945

## 2017-03-27 DIAGNOSIS — M21371 Foot drop, right foot: Secondary | ICD-10-CM | POA: Diagnosis not present

## 2017-03-28 ENCOUNTER — Ambulatory Visit (HOSPITAL_COMMUNITY): Payer: PPO

## 2017-03-28 DIAGNOSIS — R2689 Other abnormalities of gait and mobility: Secondary | ICD-10-CM

## 2017-03-28 DIAGNOSIS — R29898 Other symptoms and signs involving the musculoskeletal system: Secondary | ICD-10-CM

## 2017-03-28 DIAGNOSIS — M21371 Foot drop, right foot: Secondary | ICD-10-CM | POA: Diagnosis not present

## 2017-03-28 DIAGNOSIS — Z7409 Other reduced mobility: Secondary | ICD-10-CM

## 2017-03-28 NOTE — Patient Instructions (Signed)
  TANDEM STANCE WITH SUPPORT 3x30 seconds twice daily Stand in front of a chair, table or counter top for support. Then place the heel of one foot so that it is touching the toes of the other foot. Maintain your balance in this position.

## 2017-03-28 NOTE — Therapy (Signed)
[place], [place], [address] Phone: [phone]   Fax:  [phone]  Physical Therapy Treatment/ ReAssessment   Patient Details  Name: Roberto Rosales MRN: [id number] Date of Birth: [birth year] No Data Recorded  Encounter Date: 03/28/2017  PT End of Session - 03/28/17 1015    Visit Number  7    Number of Visits  9    Date for PT Re-Evaluation  04/03/17    Authorization Type  [employer]    Authorization Time Period  03/06/2017-04/06/17    Authorization - Visit Number  7    Authorization - Number of Visits  9    PT Start Time  0946    PT Stop Time  1030    PT Time Calculation (min)  44 min    Activity Tolerance  Patient tolerated treatment well    Behavior During Therapy  [facility] for tasks assessed/performed       Past Medical History:  Diagnosis Date  . Glaucoma   . High cholesterol     Past Surgical History:  Procedure Laterality Date  . CATARACT EXTRACTION Bilateral 2007  . TONSILLECTOMY  1950    There were no vitals filed for this visit.  Subjective Assessment - 03/28/17 0950    Subjective  Patient notes that he has noticed some improvement switching gas to the break and back. Otherwise, hasn't noticed much change. Still challenged by balance and walking.     Currently in Pain?  No/denies         [facility] PT Assessment - 03/28/17 0001      Strength   Left Knee Extension  5/5    Right Ankle Dorsiflexion  4-/5    Right Ankle Inversion  4/5    Right Ankle Eversion  4-/5    Left Ankle Inversion  4/5    Left Ankle Eversion  4/5      Transfers   Five time sit to stand comments   13.28 seconds, no UE use      Ambulation/Gait   Ambulation Distance (Feet)  662 Feet    Gait Pattern  Trendelenburg;Decreased dorsiflexion - right    Gait Comments  Decreased eccentric control Rt. ankle presented with foot slap      Static Standing Balance   Static Standing - Balance Support  No upper  extremity supported    Static Standing - Level of Assistance  5: Stand by assistance    Static Standing Balance -  Activities   Single Leg Stance - Left Leg;Single Leg Stance - Right Leg;Tandam Stance - Right Leg;Tandam Stance - Left Leg    Static Standing - Comment/# of Minutes  3 seconds bilaterally      Timed Up and Go Test   TUG  Normal TUG    Normal TUG (seconds)  8.86                  [facility] Adult PT Treatment/Exercise - 03/28/17 0001      Neuro Re-ed    Neuro Re-ed Details   [place] stem 10/10 anterior tibialis 4 pads, 54 mA 50% x 8 min with 3# weight       Ankle Exercises: Seated   Towel Crunch  Other (comment)    Towel Inversion/Eversion  3 reps;Weights    Towel Inversion/Eversion Weights (lbs)  3      Ankle Exercises: Standing   Balance Beam  tandem on half FR 2 x 30 seconds each  PT Education - 03/28/17 1028    Education provided  Yes    Education Details  tandem balance    Person(s) Educated  Patient    Methods  Handout;Demonstration    Comprehension  Verbalized understanding;Returned demonstration       PT Short Term Goals - 03/28/17 1016      PT SHORT TERM GOAL #1   Title  Patient will be independent with HEP to improve functional strength and balance gains.     Time  2    Period  Weeks    Status  On-going      PT SHORT TERM GOAL #2   Title  Patient will present with improved hip muscle strength by at least 1 grade assessed by MMT.     Baseline  Rt. ankle inversion improved by half grade, DF and eversion maintained the same.     Time  2    Period  Weeks    Status  Not Met      PT SHORT TERM GOAL #3   Title  Patient will be able to maintain single leg stance for at least 5 seconds bilaterally to decrease risk of falls with minimal trunk sway.     Baseline  3 seconds max bilaterally.     Time  2    Period  Weeks    Status  Not Met        PT Long Term Goals - 03/28/17 1017      PT LONG TERM GOAL #1   Title  Patient  will be able to complete TUG functional assessment in < 12 seconds to improve safety during ambulation.     Baseline  8.86 sec this visit. continued decreased ecentric control Rt. DF    Time  4    Period  Weeks    Status  Achieved      PT LONG TERM GOAL #2   Title  Patient will improve 5 time STS testing by at least 2 seconds to improve functional lower extremity strength.     Baseline  13.28 seconds (14 sec) first visit.     Time  4    Period  Weeks    Status  Partially Met      PT LONG TERM GOAL #3   Title  Patient will ambulate 3MWT of at least 750 feet with improve hip and ankle control and stability.     Baseline  652 ft. continued decresed DF control.     Time  4    Period  Weeks    Status  Not Met      PT LONG TERM GOAL #4   Title  Patient will have improve Rt. ankle strength by at least 1 MMT grade to improve functional strength and safety during ambulation.     Baseline  half grade.    Time  4    Period  Weeks    Status  Partially Met            Plan - 03/28/17 1016    Clinical Impression Statement  Assessment completed this session. Patient has made improvement in functional gait speed with the TUG score and functional strength increased with 5 time sit to stand testing. Patient continues to have decreased eccentric control/DF strength bilaterally Rt. > Lt. during ambulation. He has noticed functional improvement with ability to move between the gas and break while driving. Patient continues to be challenged with balance maintaining each leg 2 seconds at most  with compensatory motions upper truck, UEs, and LEs combined. Pt wishes to continue therapy at this time with focus on balance primarily and continued strengthening of ankle.      Rehab Potential  Good    PT Frequency  2x / week    PT Duration  4 weeks    PT Treatment/Interventions  ADLs/Self Care Home Management;Electrical Stimulation;DME Instruction;Gait training;Stair training;Functional mobility  training;Therapeutic exercise;Balance training;Therapeutic activities;Neuromuscular re-education;Manual techniques;Energy conservation;Patient/family education;Taping    PT Next Visit Plan  Focus balance last two session.    PT Home Exercise Plan  Eval: DF with theraband, against gravity inversion, eversion in sidelying, seated DF hold; 11/19-intrinsics mm; 11-21 tandem balance    Consulted and Agree with Plan of Care  Patient       Patient will benefit from skilled therapeutic intervention in order to improve the following deficits and impairments:  Abnormal gait, Decreased balance, Decreased endurance, Decreased coordination, Decreased strength, Decreased mobility, Difficulty walking, Postural dysfunction  Visit Diagnosis: Foot drop, right  Ankle weakness  Impaired functional mobility, balance, and endurance  Functional gait abnormality   G-Codes - 03/28/17 1318    Functional Assessment Tool Used (Outpatient Only)  Functional status decided upon PT evaluation of functional tests 5xSTS, TUG, and SLS     Functional Limitation  Mobility: Walking and moving around    Mobility: Walking and Moving Around Current Status (E8337)  At least 40 percent but less than 60 percent impaired, limited or restricted    Mobility: Walking and Moving Around Goal Status [phone])  At least 20 percent but less than 40 percent impaired, limited or restricted       Problem List There are no active problems to display for this patient.  Starr Lake PT, DPT 10:33 AM, 03/28/17 [place], [place], [address] Phone: [phone]   Fax:  [phone]  Name: Roberto Rosales MRN: [id number] Date of Birth: 1946/02/10

## 2017-04-02 ENCOUNTER — Ambulatory Visit (HOSPITAL_COMMUNITY): Payer: PPO | Admitting: Physical Therapy

## 2017-04-02 DIAGNOSIS — M21371 Foot drop, right foot: Secondary | ICD-10-CM | POA: Diagnosis not present

## 2017-04-02 DIAGNOSIS — Z7409 Other reduced mobility: Secondary | ICD-10-CM

## 2017-04-02 DIAGNOSIS — R2689 Other abnormalities of gait and mobility: Secondary | ICD-10-CM

## 2017-04-02 DIAGNOSIS — R29898 Other symptoms and signs involving the musculoskeletal system: Secondary | ICD-10-CM

## 2017-04-02 NOTE — Therapy (Signed)
[place], [place], [address] Phone: [phone]   Fax:  [phone]  Physical Therapy Treatment  Patient Details  Name: Roberto Rosales MRN: [id number] Date of Birth: December 18, 1945 No Data Recorded  Encounter Date: 04/02/2017  PT End of Session - 04/02/17 1315    Visit Number  8    Number of Visits  9    Date for PT Re-Evaluation  04/03/17    Authorization Type  [employer]    Authorization Time Period  03/06/2017-04/06/17    Authorization - Visit Number  8    Authorization - Number of Visits  10    PT Start Time  0948    PT Stop Time  1030    PT Time Calculation (min)  42 min    Activity Tolerance  Patient tolerated treatment well    Behavior During Therapy  [facility] for tasks assessed/performed       Past Medical History:  Diagnosis Date  . Glaucoma   . High cholesterol     Past Surgical History:  Procedure Laterality Date  . CATARACT EXTRACTION Bilateral 2007  . TONSILLECTOMY  1950    There were no vitals filed for this visit.                   [facility] Adult PT Treatment/Exercise - 04/02/17 0001      Ankle Exercises: Standing   Heel Walk (Round Trip)  2RT    Toe Walk (Round Trip)  2RT          Balance Exercises - 04/02/17 1300      Balance Exercises: Standing   Tandem Stance  Intermittent upper extremity support;2 reps;30 secs;Foam/compliant surface half roll, flat side down    SLS with Vectors  Upper extremity assist 1;5 reps 5 second holds    Tandem Gait  2 reps    Sidestepping  2 reps    Step Over Hurdles / Cones  2 Reps 6"/12" hurdles forward and lateral 2RT each    Other Standing Exercises  forward step down, lateral step ups 4" with eccentric control 10 reps each.          PT Short Term Goals - 03/28/17 1016      PT SHORT TERM GOAL #1   Title  Patient will be independent with HEP to improve functional strength and balance gains.     Time  2    Period   Weeks    Status  On-going      PT SHORT TERM GOAL #2   Title  Patient will present with improved hip muscle strength by at least 1 grade assessed by MMT.     Baseline  Rt. ankle inversion improved by half grade, DF and eversion maintained the same.     Time  2    Period  Weeks    Status  Not Met      PT SHORT TERM GOAL #3   Title  Patient will be able to maintain single leg stance for at least 5 seconds bilaterally to decrease risk of falls with minimal trunk sway.     Baseline  3 seconds max bilaterally.     Time  2    Period  Weeks    Status  Not Met        PT Long Term Goals - 03/28/17 1017      PT LONG TERM GOAL #1   Title  Patient will  be able to complete TUG functional assessment in < 12 seconds to improve safety during ambulation.     Baseline  8.86 sec this visit. continued decreased ecentric control Rt. DF    Time  4    Period  Weeks    Status  Achieved      PT LONG TERM GOAL #2   Title  Patient will improve 5 time STS testing by at least 2 seconds to improve functional lower extremity strength.     Baseline  13.28 seconds (14 sec) first visit.     Time  4    Period  Weeks    Status  Partially Met      PT LONG TERM GOAL #3   Title  Patient will ambulate 3MWT of at least 750 feet with improve hip and ankle control and stability.     Baseline  652 ft. continued decresed DF control.     Time  4    Period  Weeks    Status  Not Met      PT LONG TERM GOAL #4   Title  Patient will have improve Rt. ankle strength by at least 1 MMT grade to improve functional strength and safety during ambulation.     Baseline  half grade.    Time  4    Period  Weeks    Status  Partially Met            Plan - 04/02/17 1316    Clinical Impression Statement  Continued focus on balance this session.  Pt able to complete with cues for posturing and attention to task.  Most difficulty wtih toeraises in standing position (no difficulty in seated).  Worked on eccentric  strengthening/control exercises as well.  Pt with noted fatigue at end of session.      Rehab Potential  Good    PT Frequency  2x / week    PT Duration  4 weeks    PT Treatment/Interventions  ADLs/Self Care Home Management;Electrical Stimulation;DME Instruction;Gait training;Stair training;Functional mobility training;Therapeutic exercise;Balance training;Therapeutic activities;Neuromuscular re-education;Manual techniques;Energy conservation;Patient/family education;Taping    PT Next Visit Plan  Continue with focus on balance.  Next session is last and pt verbalized want to continue services.      PT Home Exercise Plan  Eval: DF with theraband, against gravity inversion, eversion in sidelying, seated DF hold; 11/19-intrinsics mm; 11-21 tandem balance    Consulted and Agree with Plan of Care  Patient       Patient will benefit from skilled therapeutic intervention in order to improve the following deficits and impairments:  Abnormal gait, Decreased balance, Decreased endurance, Decreased coordination, Decreased strength, Decreased mobility, Difficulty walking, Postural dysfunction  Visit Diagnosis: Foot drop, right  Ankle weakness  Impaired functional mobility, balance, and endurance  Functional gait abnormality     Problem List There are no active problems to display for this patient.  [name], PTA/CLT [phone]  [name] 04/02/2017, 1:44 PM  [place] [address] [place], [place], [address] Phone: [phone]   Fax:  [phone]  Name: Roberto Rosales MRN: [id number] Date of Birth: 08-24-1945

## 2017-04-04 ENCOUNTER — Ambulatory Visit (HOSPITAL_COMMUNITY): Payer: PPO

## 2017-04-04 DIAGNOSIS — Z7409 Other reduced mobility: Secondary | ICD-10-CM

## 2017-04-04 DIAGNOSIS — R2689 Other abnormalities of gait and mobility: Secondary | ICD-10-CM

## 2017-04-04 DIAGNOSIS — R29898 Other symptoms and signs involving the musculoskeletal system: Secondary | ICD-10-CM

## 2017-04-04 DIAGNOSIS — M21371 Foot drop, right foot: Secondary | ICD-10-CM | POA: Diagnosis not present

## 2017-04-04 NOTE — Therapy (Signed)
[place], [place], [address] Phone: [phone]   Fax:  [phone]  Physical Therapy Treatment  Patient Details  Name: Roberto Rosales MRN: [id number] Date of Birth: 03-11-1946 No Data Recorded  Encounter Date: 04/04/2017  PT End of Session - 04/04/17 0957    Visit Number  9    Number of Visits  9    Date for PT Re-Evaluation  04/27/17    Authorization Type  [employer]    Authorization Time Period  03/06/2017-04/06/17; additional 6 visit requested 04/04/17-04/27/2017    Authorization - Visit Number  9    Authorization - Number of Visits  10    PT Start Time  0946    PT Stop Time  1030    PT Time Calculation (min)  44 min    Activity Tolerance  Patient tolerated treatment well    Behavior During Therapy  [facility] for tasks assessed/performed       Past Medical History:  Diagnosis Date  . Glaucoma   . High cholesterol     Past Surgical History:  Procedure Laterality Date  . CATARACT EXTRACTION Bilateral 2007  . TONSILLECTOMY  1950    There were no vitals filed for this visit.  Subjective Assessment - 04/04/17 0953    Subjective  Pt notes overall he is doing better. He thinks that walking is still about the same but has seen improvements with going from gas to break pedal.     Currently in Pain?  No/denies                      [facility] Adult PT Treatment/Exercise - 04/04/17 0001      Neuro Re-ed    Neuro Re-ed Details   [place] stem 10/10 anterior tibialis 4 pads, 54 mA 50% x 8 min with 3# weight       Ankle Exercises: Seated   Towel Crunch  Other (comment) 20 reps    Towel Inversion/Eversion  3 reps;Weights    Towel Inversion/Eversion Weights (lbs)  3      Ankle Exercises: Standing   Toe Raise  20 reps    Heel Walk (Round Trip)  2RT    Toe Walk (Round Trip)  2RT          Balance Exercises - 04/04/17 1011      Balance Exercises: Standing   Tandem Stance   Intermittent upper extremity support;2 reps;30 secs;Foam/compliant surface half FR, flat side down; narros BOS forward strategy 2x30 s    SLS with Vectors  Upper extremity assist 1;5 reps 5sec hold c cones    Balance Beam  Foam in // bars 3 RT, intermediate UE assist     Step Over Hurdles / Cones  2 Reps 6"/12" hurdles forward and lateral 2RT each held lateral, combined heights this session          PT Short Term Goals - 03/28/17 1016      PT SHORT TERM GOAL #1   Title  Patient will be independent with HEP to improve functional strength and balance gains.     Time  2    Period  Weeks    Status  On-going      PT SHORT TERM GOAL #2   Title  Patient will present with improved hip muscle strength by at least 1 grade assessed by MMT.     Baseline  Rt. ankle inversion improved by half grade, DF  and eversion maintained the same.     Time  2    Period  Weeks    Status  Not Met      PT SHORT TERM GOAL #3   Title  Patient will be able to maintain single leg stance for at least 5 seconds bilaterally to decrease risk of falls with minimal trunk sway.     Baseline  3 seconds max bilaterally.     Time  2    Period  Weeks    Status  Not Met        PT Long Term Goals - 03/28/17 1017      PT LONG TERM GOAL #1   Title  Patient will be able to complete TUG functional assessment in < 12 seconds to improve safety during ambulation.     Baseline  8.86 sec this visit. continued decreased ecentric control Rt. DF    Time  4    Period  Weeks    Status  Achieved      PT LONG TERM GOAL #2   Title  Patient will improve 5 time STS testing by at least 2 seconds to improve functional lower extremity strength.     Baseline  13.28 seconds (14 sec) first visit.     Time  4    Period  Weeks    Status  Partially Met      PT LONG TERM GOAL #3   Title  Patient will ambulate 3MWT of at least 750 feet with improve hip and ankle control and stability.     Baseline  652 ft. continued decresed DF control.      Time  4    Period  Weeks    Status  Not Met      PT LONG TERM GOAL #4   Title  Patient will have improve Rt. ankle strength by at least 1 MMT grade to improve functional strength and safety during ambulation.     Baseline  half grade.    Time  4    Period  Weeks    Status  Partially Met            Plan - 04/04/17 1031    Clinical Impression Statement  Patient notes that he would like to continue for 6 more visits to continue to utilize the [place] stim to work on ankle DF and to improve balance. He has seen slight improvement with functional motion with driving increasing his safety, however, still challenged with balance to keep foot in DF Rt. > Lt. Continues to significantly be challenged by balance on even and uneven surfaces with eyes open and would benefit from continued balance skilled PT to improve ankle strategies and improve balance.     Rehab Potential  Good    PT Frequency  2x / week    PT Duration  3 weeks    PT Treatment/Interventions  ADLs/Self Care Home Management;Electrical Stimulation;DME Instruction;Gait training;Stair training;Functional mobility training;Therapeutic exercise;Balance training;Therapeutic activities;Neuromuscular re-education;Manual techniques;Energy conservation;Patient/family education;Taping    PT Next Visit Plan  Continue with focus on balance.  Next session is last and pt verbalized want to continue services.      PT Home Exercise Plan  Eval: DF with theraband, against gravity inversion, eversion in sidelying, seated DF hold; 11/19-intrinsics mm; 11-21 tandem balance    Consulted and Agree with Plan of Care  Patient       Patient will benefit from skilled therapeutic intervention in order to improve the  following deficits and impairments:  Abnormal gait, Decreased balance, Decreased endurance, Decreased coordination, Decreased strength, Decreased mobility, Difficulty walking, Postural dysfunction  Visit Diagnosis: Foot drop, right - Plan: PT  plan of care cert/re-cert  Ankle weakness - Plan: PT plan of care cert/re-cert  Impaired functional mobility, balance, and endurance - Plan: PT plan of care cert/re-cert  Functional gait abnormality - Plan: PT plan of care cert/re-cert   G-Codes - 76/81/15 0959    Functional Assessment Tool Used (Outpatient Only)  Functional status decided upon PT evaluation of functional tests 5xSTS, TUG, and SLS     Functional Limitation  Mobility: Walking and moving around    Mobility: Walking and Moving Around Current Status (B2620)  At least 40 percent but less than 60 percent impaired, limited or restricted    Mobility: Walking and Moving Around Goal Status (B5597)  At least 20 percent but less than 40 percent impaired, limited or restricted       Problem List There are no active problems to display for this patient.  Starr Lake PT, DPT 11:21 AM, 04/04/17 [place], [place], [address] Phone: [phone]   Fax:  [phone]  Name: JOELLE FLESSNER MRN: [id number] Date of Birth: Apr 14, 1946

## 2017-04-09 ENCOUNTER — Ambulatory Visit (HOSPITAL_COMMUNITY): Payer: PPO | Attending: Neurology | Admitting: Physical Therapy

## 2017-04-09 DIAGNOSIS — M6281 Muscle weakness (generalized): Secondary | ICD-10-CM | POA: Insufficient documentation

## 2017-04-09 DIAGNOSIS — M21371 Foot drop, right foot: Secondary | ICD-10-CM | POA: Insufficient documentation

## 2017-04-09 DIAGNOSIS — R29898 Other symptoms and signs involving the musculoskeletal system: Secondary | ICD-10-CM

## 2017-04-09 DIAGNOSIS — Z7409 Other reduced mobility: Secondary | ICD-10-CM | POA: Diagnosis not present

## 2017-04-09 DIAGNOSIS — R2689 Other abnormalities of gait and mobility: Secondary | ICD-10-CM | POA: Diagnosis not present

## 2017-04-09 NOTE — Therapy (Signed)
[place], [place], [address] Phone: [phone]   Fax:  [phone]  Physical Therapy Treatment  Patient Details  Name: Roberto Rosales MRN: [id number] Date of Birth: [birth year] No Data Recorded  Encounter Date: 04/09/2017  PT End of Session - 04/09/17 1301    Visit Number  10    Number of Visits  15    Date for PT Re-Evaluation  04/27/17    Authorization Type  [employer]    Authorization Time Period  03/06/2017-04/06/17; additional 6 visit requested 04/04/17-04/27/2017, gcodes done on visit 9    Authorization - Visit Number  10    Authorization - Number of Visits  19    Activity Tolerance  Patient tolerated treatment well    Behavior During Therapy  [facility] for tasks assessed/performed       Past Medical History:  Diagnosis Date  . Glaucoma   . High cholesterol     Past Surgical History:  Procedure Laterality Date  . CATARACT EXTRACTION Bilateral 2007  . TONSILLECTOMY  1950    There were no vitals filed for this visit.  Subjective Assessment - 04/09/17 1257    Subjective  Pt states he can tell some improvement.  continues to work on his HEP and has no pain or issues.      Currently in Pain?  No/denies                      [facility] Adult PT Treatment/Exercise - 04/09/17 0001      Neuro Re-ed    Neuro Re-ed Details   Russian stim 10/10 anterior tibialis 4 pads, 54 mA 50% x 10 min with 3# weight       Ankle Exercises: Standing   Toe Raise  20 reps    Heel Walk (Round Trip)  2RT    Toe Walk (Round Trip)  2RT          Balance Exercises - 04/09/17 0955      Balance Exercises: Standing   Tandem Stance  Intermittent upper extremity support;2 reps;30 secs;Foam/compliant surface half circle, flat side down, 2 each position    SLS  Eyes open;Foam/compliant surface;30 secs;Intermittent upper extremity support    SLS with Vectors  Upper extremity assist 1 10 reps, 3" holds each LE  with 1 HHA    Other Standing Exercises  bilateral forward step down, lateral step ups 4" with eccentric control 10 reps each.          PT Short Term Goals - 03/28/17 1016      PT SHORT TERM GOAL #1   Title  Patient will be independent with HEP to improve functional strength and balance gains.     Time  2    Period  Weeks    Status  On-going      PT SHORT TERM GOAL #2   Title  Patient will present with improved hip muscle strength by at least 1 grade assessed by MMT.     Baseline  Rt. ankle inversion improved by half grade, DF and eversion maintained the same.     Time  2    Period  Weeks    Status  Not Met      PT SHORT TERM GOAL #3   Title  Patient will be able to maintain single leg stance for at least 5 seconds bilaterally to decrease risk of falls with minimal trunk sway.  Baseline  3 seconds max bilaterally.     Time  2    Period  Weeks    Status  Not Met        PT Long Term Goals - 03/28/17 1017      PT LONG TERM GOAL #1   Title  Patient will be able to complete TUG functional assessment in < 12 seconds to improve safety during ambulation.     Baseline  8.86 sec this visit. continued decreased ecentric control Rt. DF    Time  4    Period  Weeks    Status  Achieved      PT LONG TERM GOAL #2   Title  Patient will improve 5 time STS testing by at least 2 seconds to improve functional lower extremity strength.     Baseline  13.28 seconds (14 sec) first visit.     Time  4    Period  Weeks    Status  Partially Met      PT LONG TERM GOAL #3   Title  Patient will ambulate 3MWT of at least 750 feet with improve hip and ankle control and stability.     Baseline  652 ft. continued decresed DF control.     Time  4    Period  Weeks    Status  Not Met      PT LONG TERM GOAL #4   Title  Patient will have improve Rt. ankle strength by at least 1 MMT grade to improve functional strength and safety during ambulation.     Baseline  half grade.    Time  4    Period   Weeks    Status  Partially Met            Plan - 04/09/17 1302    Clinical Impression Statement  Continued with POC with focus on improving ankle strength and stability . Difficulty maintaining SLS without assistance from UE greater than 1-2 seconds.  Noted challenge with eccentric step downs with bilateral LE's. Continued with russian stimulation at [place] today.    Rehab Potential  Good    PT Frequency  2x / week    PT Duration  3 weeks    PT Treatment/Interventions  ADLs/Self Care Home Management;Electrical Stimulation;DME Instruction;Gait training;Stair training;Functional mobility training;Therapeutic exercise;Balance training;Therapeutic activities;Neuromuscular re-education;Manual techniques;Energy conservation;Patient/family education;Taping    PT Next Visit Plan  Continue with focus on balance and strengthening.  Resume hurdles next session.    PT Home Exercise Plan  Eval: DF with theraband, against gravity inversion, eversion in sidelying, seated DF hold; 11/19-intrinsics mm; 11-21 tandem balance    Consulted and Agree with Plan of Care  Patient       Patient will benefit from skilled therapeutic intervention in order to improve the following deficits and impairments:  Abnormal gait, Decreased balance, Decreased endurance, Decreased coordination, Decreased strength, Decreased mobility, Difficulty walking, Postural dysfunction  Visit Diagnosis: Foot drop, right  Ankle weakness  Impaired functional mobility, balance, and endurance  Functional gait abnormality     Problem List There are no active problems to display for this patient.  Roberto Rosales, PTA/CLT [phone]  Roberto Rosales 04/09/2017, 1:07 PM  [place] [address] [place], [place], [address] Phone: [phone]   Fax:  [phone]  Name: Roberto Rosales MRN: [id number] Date of Birth: 07-03-1945

## 2017-04-11 ENCOUNTER — Encounter (HOSPITAL_COMMUNITY): Payer: PPO

## 2017-04-11 ENCOUNTER — Encounter (HOSPITAL_COMMUNITY): Payer: Self-pay

## 2017-04-11 ENCOUNTER — Ambulatory Visit (HOSPITAL_COMMUNITY): Payer: PPO

## 2017-04-11 DIAGNOSIS — Z7409 Other reduced mobility: Secondary | ICD-10-CM

## 2017-04-11 DIAGNOSIS — M21371 Foot drop, right foot: Secondary | ICD-10-CM

## 2017-04-11 DIAGNOSIS — R29898 Other symptoms and signs involving the musculoskeletal system: Secondary | ICD-10-CM

## 2017-04-11 DIAGNOSIS — R2689 Other abnormalities of gait and mobility: Secondary | ICD-10-CM

## 2017-04-11 NOTE — Therapy (Signed)
[place], [place], [address] Phone: [phone]   Fax:  [phone]  Physical Therapy Treatment  Patient Details  Name: Roberto Rosales MRN: [id number] Date of Birth: [birth year] No Data Recorded  Encounter Date: 04/11/2017  PT End of Session - 04/11/17 1028    Visit Number  11    Number of Visits  15    Date for PT Re-Evaluation  04/27/17    Authorization Type  [employer]    Authorization Time Period  03/06/2017-04/06/17; additional 6 visit requested 04/04/17-04/27/2017, gcodes done on visit 9    Authorization - Visit Number  11    Authorization - Number of Visits  19    PT Start Time  0945    PT Stop Time  1028    PT Time Calculation (min)  43 min    Activity Tolerance  Patient tolerated treatment well    Behavior During Therapy  [facility] for tasks assessed/performed       Past Medical History:  Diagnosis Date  . Glaucoma   . High cholesterol     Past Surgical History:  Procedure Laterality Date  . CATARACT EXTRACTION Bilateral 2007  . TONSILLECTOMY  1950    There were no vitals filed for this visit.  Subjective Assessment - 04/11/17 0943    Subjective  Pt reports he is doing okay today.     Currently in Pain?  No/denies                      [facility] Adult PT Treatment/Exercise - 04/11/17 0001      Neuro Re-ed    Neuro Re-ed Details   Russian stim 10/10 anterior tibialis 4 pads, 54 mA 50% x 10 min with 3# weight       Ankle Exercises: Standing   Toe Raise  20 reps    Heel Walk (Round Trip)  2RT    Toe Walk (Round Trip)  2RT    Other Standing Ankle Exercises  Heel raise x 20      Ankle Exercises: Seated   Towel Crunch  -- 20 reps    Towel Inversion/Eversion  3 reps;Weights    Towel Inversion/Eversion Weights (lbs)  -- 3 Eversion, 5 Inversion          Balance Exercises - 04/11/17 1009      Balance Exercises: Standing   Tandem Stance  Intermittent upper extremity  support;4 reps;30 secs;Foam/compliant surface    SLS with Vectors  Intermittent upper extremity assist;5 reps 3 cone taps 10-20 inches    Step Over Hurdles / Cones  3RT in // bars 8" and 4" hurdes     Other Standing Exercises  Lt forward step down, lateral step ups 6" with eccentric control 10 reps each. Lt knee pain, declined Rt. step down          PT Short Term Goals - 03/28/17 1016      PT SHORT TERM GOAL #1   Title  Patient will be independent with HEP to improve functional strength and balance gains.     Time  2    Period  Weeks    Status  On-going      PT SHORT TERM GOAL #2   Title  Patient will present with improved hip muscle strength by at least 1 grade assessed by MMT.     Baseline  Rt. ankle inversion improved by half grade, DF and eversion maintained the  same.     Time  2    Period  Weeks    Status  Not Met      PT SHORT TERM GOAL #3   Title  Patient will be able to maintain single leg stance for at least 5 seconds bilaterally to decrease risk of falls with minimal trunk sway.     Baseline  3 seconds max bilaterally.     Time  2    Period  Weeks    Status  Not Met        PT Long Term Goals - 03/28/17 1017      PT LONG TERM GOAL #1   Title  Patient will be able to complete TUG functional assessment in < 12 seconds to improve safety during ambulation.     Baseline  8.86 sec this visit. continued decreased ecentric control Rt. DF    Time  4    Period  Weeks    Status  Achieved      PT LONG TERM GOAL #2   Title  Patient will improve 5 time STS testing by at least 2 seconds to improve functional lower extremity strength.     Baseline  13.28 seconds (14 sec) first visit.     Time  4    Period  Weeks    Status  Partially Met      PT LONG TERM GOAL #3   Title  Patient will ambulate 3MWT of at least 750 feet with improve hip and ankle control and stability.     Baseline  652 ft. continued decresed DF control.     Time  4    Period  Weeks    Status  Not Met       PT LONG TERM GOAL #4   Title  Patient will have improve Rt. ankle strength by at least 1 MMT grade to improve functional strength and safety during ambulation.     Baseline  half grade.    Time  4    Period  Weeks    Status  Partially Met            Plan - 04/11/17 1028    Clinical Impression Statement  Today's session continued to focus on balance and functional dorsiflexor strengthening Rt. LE. Pt continues to have difficulty maintaining active Dorsiflexion Rt compared to Lt. Pt continues to be challenged by dynamic balance activities secondary to continue ankle weakness. However, pt continues to report compliance with HEP and is able to demonstrate slight decrease challenge of seated eversion strengthening this session. Tandem stance on foam has improved this session with less compensatory motions and LOB. Step activities utilizing Lt. quadriceps decline this session secondary to pt verbal report of pain.     Rehab Potential  Good    PT Frequency  2x / week    PT Duration  3 weeks    PT Treatment/Interventions  ADLs/Self Care Home Management;Electrical Stimulation;DME Instruction;Gait training;Stair training;Functional mobility training;Therapeutic exercise;Balance training;Therapeutic activities;Neuromuscular re-education;Manual techniques;Energy conservation;Patient/family education;Taping    PT Next Visit Plan  Continue with focus on balance and strengthening.  Resume hurdles next session.    PT Home Exercise Plan  Eval: DF with theraband, against gravity inversion, eversion in sidelying, seated DF hold; 11/19-intrinsics mm; 11-21 tandem balance    Consulted and Agree with Plan of Care  Patient       Patient will benefit from skilled therapeutic intervention in order to improve the following deficits and impairments:  Abnormal gait, Decreased balance, Decreased endurance, Decreased coordination, Decreased strength, Decreased mobility, Difficulty walking, Postural  dysfunction  Visit Diagnosis: Foot drop, right  Ankle weakness  Impaired functional mobility, balance, and endurance  Functional gait abnormality     Problem List There are no active problems to display for this patient.   Starr Lake PT, DPT 10:30 AM, 04/11/17 [place], [place], [address] Phone: [phone]   Fax:  [phone]  Name: Roberto Rosales MRN: [id number] Date of Birth: 06-02-1945

## 2017-04-12 ENCOUNTER — Encounter (HOSPITAL_COMMUNITY): Payer: PPO

## 2017-04-17 ENCOUNTER — Encounter (HOSPITAL_COMMUNITY): Payer: PPO

## 2017-04-17 ENCOUNTER — Telehealth (HOSPITAL_COMMUNITY): Payer: Self-pay | Admitting: Family Medicine

## 2017-04-17 NOTE — Telephone Encounter (Signed)
04/17/17  cx - he said he could get down his drive but afraid he can't get back up the drive

## 2017-04-18 DIAGNOSIS — I10 Essential (primary) hypertension: Secondary | ICD-10-CM | POA: Diagnosis not present

## 2017-04-18 DIAGNOSIS — R739 Hyperglycemia, unspecified: Secondary | ICD-10-CM | POA: Diagnosis not present

## 2017-04-18 DIAGNOSIS — E7800 Pure hypercholesterolemia, unspecified: Secondary | ICD-10-CM | POA: Diagnosis not present

## 2017-04-19 ENCOUNTER — Ambulatory Visit (HOSPITAL_COMMUNITY): Payer: PPO

## 2017-04-19 ENCOUNTER — Encounter (HOSPITAL_COMMUNITY): Payer: Self-pay

## 2017-04-19 DIAGNOSIS — M21371 Foot drop, right foot: Secondary | ICD-10-CM

## 2017-04-19 DIAGNOSIS — R2689 Other abnormalities of gait and mobility: Secondary | ICD-10-CM

## 2017-04-19 DIAGNOSIS — R29898 Other symptoms and signs involving the musculoskeletal system: Secondary | ICD-10-CM

## 2017-04-19 DIAGNOSIS — Z7409 Other reduced mobility: Secondary | ICD-10-CM

## 2017-04-19 NOTE — Therapy (Signed)
[place], [place], [address] Phone: [phone]   Fax:  [phone]  Physical Therapy Treatment  Patient Details  Name: Roberto Rosales MRN: [id number] Date of Birth: 03-Feb-1946 No Data Recorded  Encounter Date: 04/19/2017  PT End of Session - 04/19/17 1119    Visit Number  12    Number of Visits  15    Date for PT Re-Evaluation  04/27/17    Authorization Type  [employer]    Authorization Time Period  03/06/2017-04/06/17; additional 6 visit requested 04/04/17-04/27/2017, gcodes done on visit 9    Authorization - Visit Number  12    Authorization - Number of Visits  19    PT Start Time  1036    PT Stop Time  1120    PT Time Calculation (min)  44 min    Activity Tolerance  Patient tolerated treatment well    Behavior During Therapy  [facility] for tasks assessed/performed       Past Medical History:  Diagnosis Date  . Glaucoma   . High cholesterol     Past Surgical History:  Procedure Laterality Date  . CATARACT EXTRACTION Bilateral 2007  . TONSILLECTOMY  1950    There were no vitals filed for this visit.  Subjective Assessment - 04/19/17 1042    Subjective  Pt reports having episode of vertigo that came about last week and is feeling slightly dizzy today. He notes his balance has been a little off.     Currently in Pain?  No/denies                      [facility] Adult PT Treatment/Exercise - 04/19/17 0001      Neuro Re-ed    Neuro Re-ed Details   Russian stim 10/10 anterior tibialis 4 pads, 54 mA 50% x 10 min with 3# weight       Ankle Exercises: Seated   Towel Crunch  Other (comment) 20 reps    Towel Inversion/Eversion  3 reps;Weights    Towel Inversion/Eversion Weights (lbs)  3 inversion 5      Ankle Exercises: Standing   Heel Raises  20 reps    Toe Raise  20 reps    Side Shuffle (Round Trip)  3# active DF toe tap on 8" step 2 set x 10          Balance Exercises -  04/19/17 1103      Balance Exercises: Standing   Tandem Stance  Eyes open;3 reps;Intermittent upper extremity support;30 secs no foam due to knee pain last session, vertigo today    SLS with Vectors  Intermittent upper extremity assist;5 reps    Tandem Gait  3 reps in // bars with 3# weight on Rt. foot    Retro Gait  3 reps 3# Rt toes    Step Over Hurdles / Cones  2RT each in // bars 8" and 4" hurdes forward, lateral          PT Short Term Goals - 03/28/17 1016      PT SHORT TERM GOAL #1   Title  Patient will be independent with HEP to improve functional strength and balance gains.     Time  2    Period  Weeks    Status  On-going      PT SHORT TERM GOAL #2   Title  Patient will present with improved hip muscle strength by at least  1 grade assessed by MMT.     Baseline  Rt. ankle inversion improved by half grade, DF and eversion maintained the same.     Time  2    Period  Weeks    Status  Not Met      PT SHORT TERM GOAL #3   Title  Patient will be able to maintain single leg stance for at least 5 seconds bilaterally to decrease risk of falls with minimal trunk sway.     Baseline  3 seconds max bilaterally.     Time  2    Period  Weeks    Status  Not Met        PT Long Term Goals - 03/28/17 1017      PT LONG TERM GOAL #1   Title  Patient will be able to complete TUG functional assessment in < 12 seconds to improve safety during ambulation.     Baseline  8.86 sec this visit. continued decreased ecentric control Rt. DF    Time  4    Period  Weeks    Status  Achieved      PT LONG TERM GOAL #2   Title  Patient will improve 5 time STS testing by at least 2 seconds to improve functional lower extremity strength.     Baseline  13.28 seconds (14 sec) first visit.     Time  4    Period  Weeks    Status  Partially Met      PT LONG TERM GOAL #3   Title  Patient will ambulate 3MWT of at least 750 feet with improve hip and ankle control and stability.     Baseline  652 ft.  continued decresed DF control.     Time  4    Period  Weeks    Status  Not Met      PT LONG TERM GOAL #4   Title  Patient will have improve Rt. ankle strength by at least 1 MMT grade to improve functional strength and safety during ambulation.     Baseline  half grade.    Time  4    Period  Weeks    Status  Partially Met            Plan - 04/19/17 1210    Clinical Impression Statement  Today's session continued to focus on functional Rt. DF strength to improve foot clearance and safety during ambulation. Patient had episode of vertigo that flared up this past week and declined utilization of foam during balance exercises. Patient tolerated the rest of the session well with balance and strengthening. Patient demonstrates continued difficulty with Rt. Foot DF active motions and reports no noticeable change except with balance. Plan for 2 more visits to be completed.     Rehab Potential  Good    PT Frequency  2x / week    PT Duration  3 weeks    PT Treatment/Interventions  ADLs/Self Care Home Management;Electrical Stimulation;DME Instruction;Gait training;Stair training;Functional mobility training;Therapeutic exercise;Balance training;Therapeutic activities;Neuromuscular re-education;Manual techniques;Energy conservation;Patient/family education;Taping    PT Next Visit Plan  Continue with focus on balance and strengthening.      PT Home Exercise Plan  Eval: DF with theraband, against gravity inversion, eversion in sidelying, seated DF hold; 11/19-intrinsics mm; 11-21 tandem balance    Consulted and Agree with Plan of Care  Patient       Patient will benefit from skilled therapeutic intervention in order to improve the  following deficits and impairments:  Abnormal gait, Decreased balance, Decreased endurance, Decreased coordination, Decreased strength, Decreased mobility, Difficulty walking, Postural dysfunction  Visit Diagnosis: Foot drop, right  Impaired functional mobility,  balance, and endurance  Functional gait abnormality  Ankle weakness     Problem List There are no active problems to display for this patient.  Starr Lake PT, DPT 12:13 PM, 04/19/17 [place], [place], [address] Phone: [phone]   Fax:  [phone]  Name: Roberto Rosales MRN: [id number] Date of Birth: 03-Jun-1945

## 2017-04-20 DIAGNOSIS — R269 Unspecified abnormalities of gait and mobility: Secondary | ICD-10-CM | POA: Diagnosis not present

## 2017-04-20 DIAGNOSIS — Z6826 Body mass index (BMI) 26.0-26.9, adult: Secondary | ICD-10-CM | POA: Diagnosis not present

## 2017-04-20 DIAGNOSIS — E7800 Pure hypercholesterolemia, unspecified: Secondary | ICD-10-CM | POA: Diagnosis not present

## 2017-04-20 DIAGNOSIS — R739 Hyperglycemia, unspecified: Secondary | ICD-10-CM | POA: Diagnosis not present

## 2017-04-20 DIAGNOSIS — H81313 Aural vertigo, bilateral: Secondary | ICD-10-CM | POA: Diagnosis not present

## 2017-04-20 DIAGNOSIS — I10 Essential (primary) hypertension: Secondary | ICD-10-CM | POA: Diagnosis not present

## 2017-04-20 DIAGNOSIS — R42 Dizziness and giddiness: Secondary | ICD-10-CM | POA: Diagnosis not present

## 2017-04-20 DIAGNOSIS — Z0000 Encounter for general adult medical examination without abnormal findings: Secondary | ICD-10-CM | POA: Diagnosis not present

## 2017-04-20 DIAGNOSIS — H4053X1 Glaucoma secondary to other eye disorders, bilateral, mild stage: Secondary | ICD-10-CM | POA: Diagnosis not present

## 2017-04-20 DIAGNOSIS — M545 Low back pain: Secondary | ICD-10-CM | POA: Diagnosis not present

## 2017-04-20 DIAGNOSIS — G629 Polyneuropathy, unspecified: Secondary | ICD-10-CM | POA: Diagnosis not present

## 2017-04-24 ENCOUNTER — Ambulatory Visit (HOSPITAL_COMMUNITY): Payer: PPO

## 2017-04-24 ENCOUNTER — Encounter (HOSPITAL_COMMUNITY): Payer: Self-pay

## 2017-04-24 DIAGNOSIS — M21371 Foot drop, right foot: Secondary | ICD-10-CM

## 2017-04-24 DIAGNOSIS — Z7409 Other reduced mobility: Secondary | ICD-10-CM

## 2017-04-24 DIAGNOSIS — R2689 Other abnormalities of gait and mobility: Secondary | ICD-10-CM

## 2017-04-24 DIAGNOSIS — R29898 Other symptoms and signs involving the musculoskeletal system: Secondary | ICD-10-CM

## 2017-04-24 NOTE — Therapy (Signed)
[place], [place], [address] Phone: [phone]   Fax:  [phone]  Physical Therapy Treatment  Patient Details  Name: Roberto Rosales MRN: [id number] Date of Birth: 1945-11-07 No Data Recorded  Encounter Date: 04/24/2017  PT End of Session - 04/24/17 1028    Visit Number  13    Number of Visits  15    Date for PT Re-Evaluation  04/27/17    Authorization Type  [employer]    Authorization Time Period  03/06/2017-04/06/17; additional 6 visit requested 04/04/17-04/27/2017, gcodes done on visit 9    Authorization - Visit Number  13    Authorization - Number of Visits  19    PT Start Time  0945    PT Stop Time  1029    PT Time Calculation (min)  44 min    Activity Tolerance  Patient tolerated treatment well    Behavior During Therapy  [facility] for tasks assessed/performed       Past Medical History:  Diagnosis Date  . Glaucoma   . High cholesterol     Past Surgical History:  Procedure Laterality Date  . CATARACT EXTRACTION Bilateral 2007  . TONSILLECTOMY  1950    There were no vitals filed for this visit.  Subjective Assessment - 04/24/17 0950    Subjective  Pt reports going to his MD with complaints of dizziness and reports the MD is unsure what is causing it but confirms its not vertigo. Otherwise "still the same"    Currently in Pain?  No/denies                      [facility] Adult PT Treatment/Exercise - 04/24/17 0001      Neuro Re-ed    Neuro Re-ed Details   Russian stim 5/5, 1sec ramp up time, 4 leads anterior tibialis proximal and distal supported seated x 8 minutes with 4# weight      Ankle Exercises: Seated   Towel Crunch  Other (comment) 20 reps    Towel Inversion/Eversion  3 reps;Weights    Towel Inversion/Eversion Weights (lbs)  4 inversion 5#    Toe Raise  20 reps 4# weight      Ankle Exercises: Standing   Heel Raises  20 reps 6" step slow eccentric control    Other  Standing Ankle Exercises  Active DF, toe tap onto 6" step with 4# weight at toes x 20    Other Standing Ankle Exercises  lateral stepping with 4# weight x 2 RT          Balance Exercises - 04/24/17 1015      Balance Exercises: Standing   SLS  3 reps;Eyes open;30 secs    Tandem Gait  2 reps;Forward;Upper extremity support;Foam/compliant surface    Retro Gait  3 reps    Sidestepping  2 reps;Foam/compliant support;Upper extremity support          PT Short Term Goals - 03/28/17 1016      PT SHORT TERM GOAL #1   Title  Patient will be independent with HEP to improve functional strength and balance gains.     Time  2    Period  Weeks    Status  On-going      PT SHORT TERM GOAL #2   Title  Patient will present with improved hip muscle strength by at least 1 grade assessed by MMT.     Baseline  Rt. ankle  inversion improved by half grade, DF and eversion maintained the same.     Time  2    Period  Weeks    Status  Not Met      PT SHORT TERM GOAL #3   Title  Patient will be able to maintain single leg stance for at least 5 seconds bilaterally to decrease risk of falls with minimal trunk sway.     Baseline  3 seconds max bilaterally.     Time  2    Period  Weeks    Status  Not Met        PT Long Term Goals - 03/28/17 1017      PT LONG TERM GOAL #1   Title  Patient will be able to complete TUG functional assessment in < 12 seconds to improve safety during ambulation.     Baseline  8.86 sec this visit. continued decreased ecentric control Rt. DF    Time  4    Period  Weeks    Status  Achieved      PT LONG TERM GOAL #2   Title  Patient will improve 5 time STS testing by at least 2 seconds to improve functional lower extremity strength.     Baseline  13.28 seconds (14 sec) first visit.     Time  4    Period  Weeks    Status  Partially Met      PT LONG TERM GOAL #3   Title  Patient will ambulate 3MWT of at least 750 feet with improve hip and ankle control and stability.      Baseline  652 ft. continued decresed DF control.     Time  4    Period  Weeks    Status  Not Met      PT LONG TERM GOAL #4   Title  Patient will have improve Rt. ankle strength by at least 1 MMT grade to improve functional strength and safety during ambulation.     Baseline  half grade.    Time  4    Period  Weeks    Status  Partially Met            Plan - 04/24/17 1029    Clinical Impression Statement  Patient continues to tolerate addition of weight well and tolerated change of [place] estim to more fatiguing rate this session. Patient continues to have most difficulty with gravity resisted DF and eversion and was challenged by increased weight during seated therex. Patient continues to be challenged by balance with Rt. LE > Lt. LE and has continued difficulty control DF throughout ambulation. Pt has one more follow up session at this time.     Rehab Potential  Good    PT Frequency  2x / week    PT Duration  3 weeks    PT Treatment/Interventions  ADLs/Self Care Home Management;Electrical Stimulation;DME Instruction;Gait training;Stair training;Functional mobility training;Therapeutic exercise;Balance training;Therapeutic activities;Neuromuscular re-education;Manual techniques;Energy conservation;Patient/family education;Taping    PT Next Visit Plan  Discharge next session    PT Home Exercise Plan  Eval: DF with theraband, against gravity inversion, eversion in sidelying, seated DF hold; 11/19-intrinsics mm; 11-21 tandem balance    Consulted and Agree with Plan of Care  Patient       Patient will benefit from skilled therapeutic intervention in order to improve the following deficits and impairments:  Abnormal gait, Decreased balance, Decreased endurance, Decreased coordination, Decreased strength, Decreased mobility, Difficulty walking, Postural dysfunction  Visit Diagnosis: Foot drop, right  Impaired functional mobility, balance, and endurance  Functional gait  abnormality  Ankle weakness     Problem List There are no active problems to display for this patient.  Starr Lake PT, DPT 10:31 AM, 04/24/17 [place], [place], [address] Phone: [phone]   Fax:  [phone]  Name: Roberto Rosales MRN: [id number] Date of Birth: 10/06/1945

## 2017-04-26 ENCOUNTER — Ambulatory Visit (HOSPITAL_COMMUNITY): Payer: PPO

## 2017-04-26 ENCOUNTER — Encounter (HOSPITAL_COMMUNITY): Payer: Self-pay

## 2017-04-26 DIAGNOSIS — Z7409 Other reduced mobility: Secondary | ICD-10-CM

## 2017-04-26 DIAGNOSIS — M21371 Foot drop, right foot: Secondary | ICD-10-CM | POA: Diagnosis not present

## 2017-04-26 DIAGNOSIS — R29898 Other symptoms and signs involving the musculoskeletal system: Secondary | ICD-10-CM

## 2017-04-26 DIAGNOSIS — R2689 Other abnormalities of gait and mobility: Secondary | ICD-10-CM

## 2017-04-26 NOTE — Therapy (Signed)
[place], [place], [address] Phone: [phone]   Fax:  [phone]  Physical Therapy Treatment  Patient Details  Name: Roberto Rosales MRN: [id number] Date of Birth: 10-28-1945 No Data Recorded  Encounter Date: 04/26/2017  PT End of Session - 04/26/17 1525    Visit Number  14    Number of Visits  15    Date for PT Re-Evaluation  04/27/17    Authorization Type  [employer]    Authorization Time Period  03/06/2017-04/06/17; additional 6 visit requested 04/04/17-04/27/2017, gcodes done on visit 9    Authorization - Visit Number  14    Authorization - Number of Visits  19    PT Start Time  1513    PT Stop Time  1600    PT Time Calculation (min)  47 min    Activity Tolerance  Patient tolerated treatment well    Behavior During Therapy  [facility] for tasks assessed/performed       Past Medical History:  Diagnosis Date  . Glaucoma   . High cholesterol     Past Surgical History:  Procedure Laterality Date  . CATARACT EXTRACTION Bilateral 2007  . TONSILLECTOMY  1950    There were no vitals filed for this visit.  Subjective Assessment - 04/26/17 1524    Subjective  Patient notes that he is feeling pretty good today, no dizziness today. He still notices a big difference with transitioning from the gas to the break but walking hasn't seen a change. He does think his balance has improved some.     Currently in Pain?  No/denies         [facility] PT Assessment - 04/26/17 0001      Strength   Right Ankle Dorsiflexion  4/5    Right Ankle Inversion  4+/5    Right Ankle Eversion  4/5    Left Ankle Dorsiflexion  5/5    Left Ankle Inversion  4+/5    Left Ankle Eversion  4+/5      Transfers   Five time sit to stand comments   12.98 seconds      Ambulation/Gait   Ambulation Distance (Feet)  770 Feet    Gait Pattern  Trendelenburg;Decreased dorsiflexion - right    Gait Comments  Decreased eccentric control  Rt. ankle presented with foot slap      Static Standing Balance   Static Standing - Balance Support  No upper extremity supported    Static Standing - Level of Assistance  5: Stand by assistance    Static Standing Balance -  Activities   Single Leg Stance - Left Leg;Single Leg Stance - Right Leg;Tandam Stance - Right Leg;Tandam Stance - Left Leg    Static Standing - Comment/# of Minutes  5 seconds Rt LE, 5 sec Lt (finger taps at 3 seconds)                  [facility] Adult PT Treatment/Exercise - 04/26/17 0001      Ankle Exercises: Seated   Towel Crunch  Other (comment) 20    Towel Inversion/Eversion  3 reps;Weights Eversion only     Towel Inversion/Eversion Weights (lbs)  4 1 rep; 3# for 2 reps     Toe Raise  20 reps      Ankle Exercises: Standing   Heel Raises  20 reps 6" eccentric control    Heel Walk (Round Trip)  2RT    Toe  Walk (Round Trip)  2RT          Balance Exercises - 04/26/17 1556      Balance Exercises: Standing   Tandem Gait  2 reps;Forward;Upper extremity support;Foam/compliant surface          PT Short Term Goals - 04/26/17 1526      PT SHORT TERM GOAL #1   Title  Patient will be independent with HEP to improve functional strength and balance gains.     Time  2    Period  Weeks    Status  On-going      PT SHORT TERM GOAL #2   Title  Patient will present with improved hip muscle strength by at least 1 grade assessed by MMT.     Baseline  12/20: Rt. half grade improvement, left full grade inversion and eversion     Time  2    Period  Weeks    Status  Partially Met      PT SHORT TERM GOAL #3   Title  Patient will be able to maintain single leg stance for at least 5 seconds bilaterally to decrease risk of falls with minimal trunk sway.     Baseline  12/20: 5 seconds bilaterally    Time  2    Period  Weeks    Status  Achieved        PT Long Term Goals - 04/26/17 1528      PT LONG TERM GOAL #1   Title  Patient will be able to complete  TUG functional assessment in < 12 seconds to improve safety during ambulation.     Baseline  8.86 sec this visit. continued decreased ecentric control Rt. DF    Time  4    Period  Weeks    Status  Achieved      PT LONG TERM GOAL #2   Title  Patient will improve 5 time STS testing by at least 2 seconds to improve functional lower extremity strength.     Baseline  12/20: 12 seconds no UE assist  (14 sec) first visit.     Time  4    Period  Weeks    Status  Achieved      PT LONG TERM GOAL #3   Title  Patient will ambulate 3MWT of at least 750 feet with improve hip and ankle control and stability.     Baseline  12/20: 770 ft this session    Time  4    Period  Weeks    Status  Achieved      PT LONG TERM GOAL #4   Title  Patient will have improve Rt. ankle strength by at least 1 MMT grade to improve functional strength and safety during ambulation.     Baseline  12/20: progressed 1/2 grade globally Rt LE; Left progression 4/5 to 4+/5 inversion and eversion     Time  4    Period  Weeks    Status  Partially Met            Plan - 04/26/17 1705    Clinical Impression Statement  Pt reassessed for remaining goals and determined discharged at this time. Overall, patient has made functional improvements reported during driving with moving from the break to the gas pedal, slight improvement in MMT grading of the right and left ankles, and improvement with balance decreasing risk of falls. He has reach maximum rehab potential at this time and was encouraged to  continue his HEP and other exercises at home.  He continues to report no pain at this time, he has no follow up questions regarding HEP or care provided, and he continues to present with difficulty with eccentric control bilateral dorsiflexors during ambulation Rt. > Lt.     Rehab Potential  Good    PT Frequency  2x / week    PT Duration  3 weeks    PT Treatment/Interventions  ADLs/Self Care Home Management;Electrical Stimulation;DME  Instruction;Gait training;Stair training;Functional mobility training;Therapeutic exercise;Balance training;Therapeutic activities;Neuromuscular re-education;Manual techniques;Energy conservation;Patient/family education;Taping    PT Next Visit Plan  Discharged    PT Home Exercise Plan  Eval: DF with theraband, against gravity inversion, eversion in sidelying, seated DF hold; 11/19-intrinsics mm; 11-21 tandem balance    Consulted and Agree with Plan of Care  Patient       Patient will benefit from skilled therapeutic intervention in order to improve the following deficits and impairments:  Abnormal gait, Decreased balance, Decreased endurance, Decreased coordination, Decreased strength, Decreased mobility, Difficulty walking, Postural dysfunction  Visit Diagnosis: Foot drop, right  Impaired functional mobility, balance, and endurance  Ankle weakness  Functional gait abnormality   G-Codes - 05/25/2017 1711    Functional Assessment Tool Used (Outpatient Only)  Functional status decided upon PT evaluation of functional tests 5xSTS, TUG, and SLS     Functional Limitation  Mobility: Walking and moving around    Mobility: Walking and Moving Around Current Status (R7408)  At least 40 percent but less than 60 percent impaired, limited or restricted    Mobility: Walking and Moving Around Discharge Status [phone])  At least 20 percent but less than 40 percent impaired, limited or restricted       PHYSICAL THERAPY DISCHARGE SUMMARY  Visits from Start of Care: 14  Current functional level related to goals / functional outcomes: Patient has made improvements with strength and balance goals. He reports improvement in ability to switch from the gas to the break pedal, as well.    Remaining deficits: Continued functional strength deficits secondary to diagnosis    Education / Equipment: HEP given along with options for flexible dynamic brace if needed during ambulation to decrease risk of falls  that he could drive with per pt request.  Plan: Patient agrees to discharge.  Patient goals were partially met. Patient is being discharged due to meeting the stated rehab goals.  ?????       Problem List There are no active problems to display for this patient.  Starr Lake PT, DPT 5:11 PM, 05/25/2017 [place], [place], [address] Phone: [phone]   Fax:  [phone]  Name: Roberto Rosales MRN: [id number] Date of Birth: 08-08-1945

## 2017-05-07 DIAGNOSIS — Z6826 Body mass index (BMI) 26.0-26.9, adult: Secondary | ICD-10-CM | POA: Diagnosis not present

## 2017-05-07 DIAGNOSIS — J209 Acute bronchitis, unspecified: Secondary | ICD-10-CM | POA: Diagnosis not present

## 2017-05-17 DIAGNOSIS — Z6825 Body mass index (BMI) 25.0-25.9, adult: Secondary | ICD-10-CM | POA: Diagnosis not present

## 2017-05-17 DIAGNOSIS — R030 Elevated blood-pressure reading, without diagnosis of hypertension: Secondary | ICD-10-CM | POA: Diagnosis not present

## 2017-05-17 DIAGNOSIS — M21371 Foot drop, right foot: Secondary | ICD-10-CM | POA: Diagnosis not present

## 2017-06-04 DIAGNOSIS — H401131 Primary open-angle glaucoma, bilateral, mild stage: Secondary | ICD-10-CM | POA: Diagnosis not present

## 2017-06-19 DIAGNOSIS — F5221 Male erectile disorder: Secondary | ICD-10-CM | POA: Diagnosis not present

## 2017-06-19 DIAGNOSIS — Z6825 Body mass index (BMI) 25.0-25.9, adult: Secondary | ICD-10-CM | POA: Diagnosis not present

## 2017-06-30 DIAGNOSIS — Z6825 Body mass index (BMI) 25.0-25.9, adult: Secondary | ICD-10-CM | POA: Diagnosis not present

## 2017-06-30 DIAGNOSIS — L959 Vasculitis limited to the skin, unspecified: Secondary | ICD-10-CM | POA: Diagnosis not present

## 2017-06-30 DIAGNOSIS — G629 Polyneuropathy, unspecified: Secondary | ICD-10-CM | POA: Diagnosis not present

## 2017-08-02 ENCOUNTER — Encounter (HOSPITAL_COMMUNITY): Payer: Self-pay | Admitting: Emergency Medicine

## 2017-08-02 ENCOUNTER — Other Ambulatory Visit: Payer: Self-pay

## 2017-08-02 ENCOUNTER — Observation Stay (HOSPITAL_COMMUNITY)
Admission: EM | Admit: 2017-08-02 | Discharge: 2017-08-03 | Disposition: A | Discharge status: Home / Self Care | Payer: PPO | Attending: Internal Medicine | Admitting: Internal Medicine

## 2017-08-02 DIAGNOSIS — R197 Diarrhea, unspecified: Secondary | ICD-10-CM

## 2017-08-02 DIAGNOSIS — A0811 Acute gastroenteropathy due to Norwalk agent: Principal | ICD-10-CM | POA: Insufficient documentation

## 2017-08-02 DIAGNOSIS — Z79899 Other long term (current) drug therapy: Secondary | ICD-10-CM | POA: Insufficient documentation

## 2017-08-02 DIAGNOSIS — R112 Nausea with vomiting, unspecified: Secondary | ICD-10-CM | POA: Diagnosis not present

## 2017-08-02 DIAGNOSIS — K529 Noninfective gastroenteritis and colitis, unspecified: Secondary | ICD-10-CM

## 2017-08-02 DIAGNOSIS — E876 Hypokalemia: Secondary | ICD-10-CM | POA: Diagnosis not present

## 2017-08-02 DIAGNOSIS — E860 Dehydration: Secondary | ICD-10-CM

## 2017-08-02 DIAGNOSIS — E785 Hyperlipidemia, unspecified: Secondary | ICD-10-CM | POA: Diagnosis not present

## 2017-08-02 DIAGNOSIS — K2970 Gastritis, unspecified, without bleeding: Secondary | ICD-10-CM | POA: Diagnosis not present

## 2017-08-02 DIAGNOSIS — E782 Mixed hyperlipidemia: Secondary | ICD-10-CM | POA: Diagnosis present

## 2017-08-02 DIAGNOSIS — H409 Unspecified glaucoma: Secondary | ICD-10-CM | POA: Insufficient documentation

## 2017-08-02 HISTORY — DX: Polyneuropathy, unspecified: G62.9

## 2017-08-02 LAB — CBC WITH DIFFERENTIAL/PLATELET
BASOS PCT: 0 %
Basophils Absolute: 0 10*3/uL (ref 0.0–0.1)
EOS ABS: 0.1 10*3/uL (ref 0.0–0.7)
EOS PCT: 1 %
HCT: 47.2 % (ref 39.0–52.0)
Hemoglobin: 16.1 g/dL (ref 13.0–17.0)
LYMPHS ABS: 0.3 10*3/uL — AB (ref 0.7–4.0)
Lymphocytes Relative: 3 %
MCH: 28.9 pg (ref 26.0–34.0)
MCHC: 34.1 g/dL (ref 30.0–36.0)
MCV: 84.7 fL (ref 78.0–100.0)
MONO ABS: 0.5 10*3/uL (ref 0.1–1.0)
MONOS PCT: 4 %
NEUTROS PCT: 92 %
Neutro Abs: 11 10*3/uL — ABNORMAL HIGH (ref 1.7–7.7)
PLATELETS: 139 10*3/uL — AB (ref 150–400)
RBC: 5.57 MIL/uL (ref 4.22–5.81)
RDW: 13.8 % (ref 11.5–15.5)
WBC: 11.8 10*3/uL — ABNORMAL HIGH (ref 4.0–10.5)

## 2017-08-02 LAB — C DIFFICILE QUICK SCREEN W PCR REFLEX
C DIFFICILE (CDIFF) INTERP: NOT DETECTED
C DIFFICLE (CDIFF) ANTIGEN: NEGATIVE
C Diff toxin: NEGATIVE

## 2017-08-02 LAB — COMPREHENSIVE METABOLIC PANEL
ALK PHOS: 73 U/L (ref 38–126)
ALT: 31 U/L (ref 17–63)
AST: 33 U/L (ref 15–41)
Albumin: 4.3 g/dL (ref 3.5–5.0)
Anion gap: 13 (ref 5–15)
BUN: 21 mg/dL — AB (ref 6–20)
CALCIUM: 9.3 mg/dL (ref 8.9–10.3)
CHLORIDE: 104 mmol/L (ref 101–111)
CO2: 22 mmol/L (ref 22–32)
CREATININE: 0.93 mg/dL (ref 0.61–1.24)
GFR calc non Af Amer: >60 mL/min (ref >60)
Glucose, Bld: 146 mg/dL — ABNORMAL HIGH (ref 65–99)
Potassium: 4 mmol/L (ref 3.5–5.1)
SODIUM: 139 mmol/L (ref 135–145)
Total Bilirubin: 1.5 mg/dL — ABNORMAL HIGH (ref 0.3–1.2)
Total Protein: 7.3 g/dL (ref 6.5–8.1)

## 2017-08-02 LAB — GASTROINTESTINAL PANEL BY PCR, STOOL (REPLACES STOOL CULTURE)
ADENOVIRUS F40/41: NOT DETECTED
ASTROVIRUS: NOT DETECTED
CAMPYLOBACTER SPECIES: NOT DETECTED
CYCLOSPORA CAYETANENSIS: NOT DETECTED
Cryptosporidium: NOT DETECTED
ENTEROPATHOGENIC E COLI (EPEC): NOT DETECTED
ENTEROTOXIGENIC E COLI (ETEC): NOT DETECTED
Entamoeba histolytica: NOT DETECTED
Enteroaggregative E coli (EAEC): NOT DETECTED
Giardia lamblia: NOT DETECTED
NOROVIRUS GI/GII: DETECTED — AB
PLESIMONAS SHIGELLOIDES: NOT DETECTED
Rotavirus A: NOT DETECTED
SAPOVIRUS (I, II, IV, AND V): NOT DETECTED
SHIGA LIKE TOXIN PRODUCING E COLI (STEC): NOT DETECTED
Salmonella species: NOT DETECTED
Shigella/Enteroinvasive E coli (EIEC): NOT DETECTED
VIBRIO SPECIES: NOT DETECTED
Vibrio cholerae: NOT DETECTED
Yersinia enterocolitica: NOT DETECTED

## 2017-08-02 MED ORDER — ACETAMINOPHEN 325 MG PO TABS
650.0000 mg | ORAL_TABLET | Freq: Four times a day (QID) | ORAL | Status: DC | PRN
  Administered 2017-08-02: 650 mg via ORAL
  Filled 2017-08-02: qty 2

## 2017-08-02 MED ORDER — ADULT MULTIVITAMIN W/MINERALS CH
1.0000 | ORAL_TABLET | Freq: Every day | ORAL | Status: DC
  Administered 2017-08-03: 1 via ORAL
  Filled 2017-08-02 (×2): qty 1

## 2017-08-02 MED ORDER — DIPHENOXYLATE-ATROPINE 2.5-0.025 MG/5ML PO LIQD: 10.0000 mL | Freq: Four times a day (QID) | ORAL | Status: DC

## 2017-08-02 MED ORDER — SODIUM CHLORIDE 0.9 % IV BOLUS
1000.0000 mL | Freq: Once | INTRAVENOUS | Status: AC
  Administered 2017-08-02: 1000 mL via INTRAVENOUS

## 2017-08-02 MED ORDER — ONDANSETRON HCL 4 MG/2ML IJ SOLN
4.0000 mg | Freq: Once | INTRAMUSCULAR | Status: AC
  Administered 2017-08-02: 4 mg via INTRAVENOUS
  Filled 2017-08-02: qty 2

## 2017-08-02 MED ORDER — LOPERAMIDE HCL 2 MG PO CAPS
4.0000 mg | ORAL_CAPSULE | Freq: Once | ORAL | Status: AC
  Administered 2017-08-02: 4 mg via ORAL
  Filled 2017-08-02: qty 2

## 2017-08-02 MED ORDER — LATANOPROST 0.005 % OP SOLN
1.0000 [drp] | Freq: Every day | OPHTHALMIC | Status: DC
  Administered 2017-08-02: 1 [drp] via OPHTHALMIC
  Filled 2017-08-02: qty 2.5

## 2017-08-02 MED ORDER — DIPHENOXYLATE-ATROPINE 2.5-0.025 MG PO TABS
2.0000 | ORAL_TABLET | Freq: Four times a day (QID) | ORAL | Status: DC | PRN
  Administered 2017-08-02 (×2): 2 via ORAL
  Filled 2017-08-02 (×2): qty 2

## 2017-08-02 MED ORDER — LOPERAMIDE HCL 2 MG PO CAPS
2.0000 mg | ORAL_CAPSULE | Freq: Once | ORAL | Status: AC
  Administered 2017-08-02: 2 mg via ORAL
  Filled 2017-08-02: qty 1

## 2017-08-02 MED ORDER — ENOXAPARIN SODIUM 40 MG/0.4ML ~~LOC~~ SOLN
40.0000 mg | SUBCUTANEOUS | Status: DC
  Administered 2017-08-02: 40 mg via SUBCUTANEOUS
  Filled 2017-08-02: qty 0.4

## 2017-08-02 MED ORDER — LOPERAMIDE HCL 2 MG PO CAPS
2.0000 mg | ORAL_CAPSULE | ORAL | Status: DC | PRN
  Administered 2017-08-02: 2 mg via ORAL
  Filled 2017-08-02: qty 1

## 2017-08-02 MED ORDER — SODIUM CHLORIDE 0.9 % IV SOLN
INTRAVENOUS | Status: DC
  Administered 2017-08-02 – 2017-08-03 (×2): via INTRAVENOUS

## 2017-08-02 MED ORDER — ATORVASTATIN CALCIUM 10 MG PO TABS
5.0000 mg | ORAL_TABLET | Freq: Every day | ORAL | Status: DC
  Filled 2017-08-02: qty 1

## 2017-08-02 NOTE — ED Notes (Signed)
Blankets provided to pt.

## 2017-08-02 NOTE — ED Notes (Signed)
Patient had uncontrolled bout of diarrhea in bed. Patient changed and cleaned and helped to [facility]. Patient had 2 more large watery bowel movements.

## 2017-08-02 NOTE — Progress Notes (Signed)
GI Panel came back positive for NoroVirus.  Midlevel notified via text page.  No new orders at this time.  Will continue to monitor.

## 2017-08-02 NOTE — H&P (Signed)
[facility] H&P   Patient Demographics:    Roberto Rosales, is a 72 y.o. male  MRN: [id number]   DOB - 1945-06-01  Admit Date - 08/02/2017  Outpatient Primary MD for the patient is [name], [name], MD  Referring MD: Dr. Rogene Houston  Outpatient Specialists: None  Patient coming from: Home  Chief Complaint  Patient presents with  . Nausea  . Emesis  . Diarrhea      HPI:    Roberto Rosales  is a 72 y.o. male, with hyperlipidemia and glaucoma who presented with several episodes of persistent watery diarrhea since midnight.  Patient denies eating anything outside, new medications (both prescribed or over-the-counter), recent antibiotic use, recent travel or sick contact.  He woke up around midnight today with several episodes of nonbloody watery diarrhea (he said it was at least a dozen times).  Did not notice any mucus in stool.  He denies any nausea, vomiting or abdominal pain.  Denies any headache, dizziness, blurred vision, subjective fevers, chills, chest pain, palpitations, shortness of breath, dysuria, muscle cramps, weakness or numbness of extremities.  Denies similar symptoms in the past. Course in the ED  he was  febrile to 100.5 F, tachycardic to 110s with normal blood pressure.  Blood work showed WC of 11.8, normal hemoglobin and low normal platelets (139).  Chemistry was unremarkable with glucose of 146.  Stool for C. difficile was negative.  GI pathogen panel sent. Patient given 3 L IV normal saline bolus and was still having diarrhea (4 episodes since presenting to ED) with heart rate still in 110s.  Was also given few doses of Imodium without relief.  Hospitalist consulted for observation to medical floor.    Review of systems:    In addition to the HPI above, No Fever-chills, No Headache, No changes with Vision or hearing, No problems swallowing food or Liquids, No Chest pain,  Cough or Shortness of Breath, No Abdominal pain, No Nausea or Vommitting, diarrhea++++ No Blood in stool or Urine, No dysuria, No new skin rashes or bruises, No new joints pains-aches,  No new weakness, tingling, numbness in any extremity, No recent weight gain or loss, No polyuria, polydypsia or polyphagia, No significant Mental Stressors.   With Past History of the following :    Past Medical History:  Diagnosis Date  . Glaucoma   . High cholesterol       Past Surgical History:  Procedure Laterality Date  . BACK SURGERY    . CATARACT EXTRACTION Bilateral 2007  . TONSILLECTOMY  1950      Social History:     Social History   Tobacco Use  . Smoking status: Never Smoker  . Smokeless tobacco: Never Used  Substance Use Topics  . Alcohol use: No     Lives -home with wife  Mobility -independent     Family History :  Family History  Problem Relation Age of Onset  . Cerebral aneurysm Mother   . Heart disease Father   . Stroke Father   . Neuropathy Neg Hx       Home Medications:   Prior to Admission medications   Medication Sig Start Date End Date Taking? Authorizing Provider  atorvastatin (LIPITOR) 10 MG tablet Take 5 mg by mouth.  08/16/16  Yes [provider]  latanoprost (XALATAN) 0.005 % ophthalmic solution Place 1 drop into both eyes at bedtime.  07/18/16  Yes [provider]  Multiple Vitamin (MULTIVITAMIN) tablet Take 1 tablet by mouth daily.   Yes [provider]     Allergies:     Allergies  Allergen Reactions  . Codeine Nausea Only     Physical Exam:   Vitals  Blood pressure 129/73, pulse (!) 109, temperature 99.5 F (37.5 C), temperature source Oral, resp. rate 15, height 6\' 1"  (1.854 m), weight 86.2 kg (190 lb), SpO2 96 %.    General: Elderly male lying in bed not in distress, appears fatigued HEENT: No pallor, no icterus, dry oral mucosa, supple neck, no cervical lymphadenopathy Chest: Clear to  auscultation bilaterally CVS: Normal S1 and S2, no murmur rub or gallop GI: Soft, nondistended, nontender, bowel sounds present Musculoskeletal: Warm, no edema, normal skin CNS: Alert and oriented him a nonfocal     Data Review:    CBC Recent Labs  Lab 08/02/17 0609  WBC 11.8*  HGB 16.1  HCT 47.2  PLT 139*  MCV 84.7  MCH 28.9  MCHC 34.1  RDW 13.8  LYMPHSABS 0.3*  MONOABS 0.5  EOSABS 0.1  BASOSABS 0.0   ------------------------------------------------------------------------------------------------------------------  Chemistries  Recent Labs  Lab 08/02/17 0609  NA 139  K 4.0  CL 104  CO2 22  GLUCOSE 146*  BUN 21*  CREATININE 0.93  CALCIUM 9.3  AST 33  ALT 31  ALKPHOS 73  BILITOT 1.5*   ------------------------------------------------------------------------------------------------------------------ estimated creatinine clearance is 82.3 mL/min (by C-G formula based on SCr of 0.93 mg/dL). ------------------------------------------------------------------------------------------------------------------ No results for input(s): TSH, T4TOTAL, T3FREE, THYROIDAB in the last 72 hours.  Invalid input(s): FREET3  Coagulation profile No results for input(s): INR, PROTIME in the last 168 hours. ------------------------------------------------------------------------------------------------------------------- No results for input(s): DDIMER in the last 72 hours. -------------------------------------------------------------------------------------------------------------------  Cardiac Enzymes No results for input(s): CKMB, TROPONINI, MYOGLOBIN in the last 168 hours.  Invalid input(s): CK ------------------------------------------------------------------------------------------------------------------ No results found for: BNP   ---------------------------------------------------------------------------------------------------------------  Urinalysis No results  found for: COLORURINE, APPEARANCEUR, LABSPEC, [place], GLUCOSEU, HGBUR, BILIRUBINUR, KETONESUR, PROTEINUR, UROBILINOGEN, NITRITE, LEUKOCYTESUR  ----------------------------------------------------------------------------------------------------------------   Imaging Results:    No results found.  My personal review of EKG: Not done.   Assessment & Plan:    Principal Problem:   Acute gastroenteritis Suspect acute viral diarrhea.  Stool for C. difficile negative.  Placed on observation.  Received 3 L IV normal saline bolus.  Continue maintaining normal saline at 125 cc/h.  Supportive care with Imodium and Lomotil.  Follow GI pathogen panel. Full liquid diet for now.   Active Problems: Fever Suspect due to acute illness.  Continue with supportive care.    Hyperlipemia Resume Lipitor    Glaucoma Continue home eyedrops     DVT Prophylaxis: Subcu Lovenox  AM Labs Ordered, also please review Full Orders  Family Communication: Admission, patients condition and plan of care including tests being ordered have been discussed with the patient  Code Status full code  Likely DC to home in a.m.  Condition fair  Consults called: None  Admission status: Observation  Time spent in minutes : 40   Roberto Rosales M.D on 08/02/2017 at 12:06 PM  Between 7am to 7pm - Pager - [phone]. After 7pm go to [email or web address] - password [facility]  [facility] - Office  [phone]

## 2017-08-02 NOTE — ED Notes (Signed)
Patient transported to X-ray 

## 2017-08-02 NOTE — ED Triage Notes (Signed)
[facility] called out for n/v/d since 0000, pt reports 10-15 episodes of diarrhea, dry heaves and no vomiting, no fever or other s/s

## 2017-08-02 NOTE — ED Notes (Signed)
Wife Jeannette's phone number [phone]. Call if anything changes or if she is needed.

## 2017-08-02 NOTE — ED Provider Notes (Signed)
[facility] EMERGENCY DEPARTMENT Provider Note   CSN: [id number] Arrival date & time: 08/02/17  7616   Time seen 05:40 AM  History   Chief Complaint Chief Complaint  Patient presents with  . Nausea  . Emesis  . Diarrhea    HPI Roberto Rosales is a 72 y.o. male.  HPI states he was fine and went to bed.  He states he was awakened at midnight with lots of nausea.  He states he laid in bed not feeling well until 2 AM when he started having diarrhea and he states he had a large amount of watery diarrhea.  He states then he was having it every 10 minutes.  He also started having dry heaves.  He denies fever.  He denies coughing.  He states he does feel lightheaded when he sits up and it also makes his nausea worse.  He denies being around anybody who is sick, he denies eating any different types of foods.  He denies being on antibiotics.  PCP [name], [name], MD   Past Medical History:  Diagnosis Date  . Glaucoma   . High cholesterol     There are no active problems to display for this patient.   Past Surgical History:  Procedure Laterality Date  . BACK SURGERY    . CATARACT EXTRACTION Bilateral 2007  . TONSILLECTOMY  1950        Home Medications    Prior to Admission medications   Medication Sig Start Date End Date Taking? Authorizing Provider  atorvastatin (LIPITOR) 10 MG tablet Take 5 mg by mouth.  08/16/16  Yes [provider]  Multiple Vitamin (MULTIVITAMIN) tablet Take 1 tablet by mouth daily.   Yes [provider]  latanoprost (XALATAN) 0.005 % ophthalmic solution  07/18/16   [provider]    Family History Family History  Problem Relation Age of Onset  . Cerebral aneurysm Mother   . Heart disease Father   . Stroke Father   . Neuropathy Neg Hx     Social History Social History   Tobacco Use  . Smoking status: Never Smoker  . Smokeless tobacco: Never Used  Substance Use Topics  . Alcohol use: No  . Drug use: No  lives at  home  Lives with spouse   Allergies   Codeine   Review of Systems Review of Systems  All other systems reviewed and are negative.    Physical Exam Updated Vital Signs BP (!) 145/106   Pulse (!) 101   Temp 98.1 F (36.7 C) (Oral)   Resp (!) 26   Ht 6\' 1"  (1.854 m)   Wt 86.2 kg (190 lb)   SpO2 97%   BMI 25.07 kg/m   Vital signs normal except borderline tachycardia   Physical Exam  Constitutional: He is oriented to person, place, and time. He appears well-developed and well-nourished.  Non-toxic appearance. He does not appear ill. No distress.  HENT:  Head: Normocephalic and atraumatic.  Right Ear: External ear normal.  Left Ear: External ear normal.  Nose: Nose normal. No mucosal edema or rhinorrhea.  Mouth/Throat: Mucous membranes are dry. No dental abscesses or uvula swelling.  Eyes: Pupils are equal, round, and reactive to light. Conjunctivae and EOM are normal.  Neck: Normal range of motion and full passive range of motion without pain. Neck supple.  Cardiovascular: Normal rate, regular rhythm and normal heart sounds. Exam reveals no gallop and no friction rub.  No murmur heard. Pulmonary/Chest: Effort  normal and breath sounds normal. No respiratory distress. He has no wheezes. He has no rhonchi. He has no rales. He exhibits no tenderness and no crepitus.  Abdominal: Soft. Normal appearance and bowel sounds are normal. He exhibits no distension. There is no tenderness. There is no rebound and no guarding.  Musculoskeletal: Normal range of motion. He exhibits no edema or tenderness.  Moves all extremities well.   Neurological: He is alert and oriented to person, place, and time. He has normal strength. No cranial nerve deficit.  Skin: Skin is warm, dry and intact. No rash noted. No erythema. There is pallor.  Psychiatric: He has a normal mood and affect. His speech is normal and behavior is normal. His mood appears not anxious.  Nursing note and vitals  reviewed.    ED Treatments / Results  Labs (all labs ordered are listed, but only abnormal results are displayed) Results for orders placed or performed during the hospital encounter of 08/02/17  Comprehensive metabolic panel  Result Value Ref Range   Sodium 139 135 - 145 mmol/L   Potassium 4.0 3.5 - 5.1 mmol/L   Chloride 104 101 - 111 mmol/L   CO2 22 22 - 32 mmol/L   Glucose, Bld 146 (H) 65 - 99 mg/dL   BUN 21 (H) 6 - 20 mg/dL   Creatinine, Ser 0.93 0.61 - 1.24 mg/dL   Calcium 9.3 8.9 - 10.3 mg/dL   Total Protein 7.3 6.5 - 8.1 g/dL   Albumin 4.3 3.5 - 5.0 g/dL   AST 33 15 - 41 U/L   ALT 31 17 - 63 U/L   Alkaline Phosphatase 73 38 - 126 U/L   Total Bilirubin 1.5 (H) 0.3 - 1.2 mg/dL   GFR calc non Af Amer >60 >60 mL/min   GFR calc Af Amer >60 >60 mL/min   Anion gap 13 5 - 15  CBC with Differential  Result Value Ref Range   WBC 11.8 (H) 4.0 - 10.5 K/uL   RBC 5.57 4.22 - 5.81 MIL/uL   Hemoglobin 16.1 13.0 - 17.0 g/dL   HCT 47.2 39.0 - 52.0 %   MCV 84.7 78.0 - 100.0 fL   MCH 28.9 26.0 - 34.0 pg   MCHC 34.1 30.0 - 36.0 g/dL   RDW 13.8 11.5 - 15.5 %   Platelets 139 (L) 150 - 400 K/uL   Neutrophils Relative % 92 %   Neutro Abs 11.0 (H) 1.7 - 7.7 K/uL   Lymphocytes Relative 3 %   Lymphs Abs 0.3 (L) 0.7 - 4.0 K/uL   Monocytes Relative 4 %   Monocytes Absolute 0.5 0.1 - 1.0 K/uL   Eosinophils Relative 1 %   Eosinophils Absolute 0.1 0.0 - 0.7 K/uL   Basophils Relative 0 %   Basophils Absolute 0.0 0.0 - 0.1 K/uL   Laboratory interpretation all normal except mild hyperglycemia, mild elevation of BUN consistent with dehydration, leukocytosis mild   EKG None  Radiology No results found.  Procedures Procedures (including critical care time)  Medications Ordered in ED Medications  sodium chloride 0.9 % bolus 1,000 mL (1,000 mLs Intravenous New Bag/Given 08/02/17 0610)  sodium chloride 0.9 % bolus 1,000 mL (1,000 mLs Intravenous New Bag/Given 08/02/17 0610)  ondansetron  (ZOFRAN) injection 4 mg (4 mg Intravenous Given 08/02/17 0610)  loperamide (IMODIUM) capsule 4 mg (4 mg Oral Given 08/02/17 0610)     Initial Impression / Assessment and Plan / ED Course  I have reviewed the triage vital signs  and the nursing notes.  Pertinent labs & imaging results that were available during my care of the patient were reviewed by me and considered in my medical decision making (see chart for details).     Patient was given IV fluids for hydration, IV nausea medication and oral Imodium for his diarrhea.  Recheck at 7 AM patient states he still feeling very nauseated.  He has not had diarrhea since I saw him initially however he did have to go while I was seeing him.  He was given a second dose of Imodium.  08:00 AM patient turned over to Dr Rogene Houston at change of shift for disposition  Final Clinical Impressions(s) / ED Diagnoses   Final diagnoses:  Nausea vomiting and diarrhea  Dehydration    Disposition pending  Rolland Porter, MD, Barbette Or, MD 08/02/17 [phone]

## 2017-08-02 NOTE — ED Notes (Signed)
Patient on [facility] with bout of diarrhea.

## 2017-08-03 DIAGNOSIS — E876 Hypokalemia: Secondary | ICD-10-CM

## 2017-08-03 DIAGNOSIS — A0811 Acute gastroenteropathy due to Norwalk agent: Secondary | ICD-10-CM | POA: Diagnosis not present

## 2017-08-03 DIAGNOSIS — K529 Noninfective gastroenteritis and colitis, unspecified: Secondary | ICD-10-CM | POA: Diagnosis not present

## 2017-08-03 DIAGNOSIS — R197 Diarrhea, unspecified: Secondary | ICD-10-CM | POA: Diagnosis not present

## 2017-08-03 DIAGNOSIS — E860 Dehydration: Secondary | ICD-10-CM | POA: Diagnosis not present

## 2017-08-03 LAB — BASIC METABOLIC PANEL
ANION GAP: 6 (ref 5–15)
BUN: 17 mg/dL (ref 6–20)
CHLORIDE: 109 mmol/L (ref 101–111)
CO2: 24 mmol/L (ref 22–32)
Calcium: 7.3 mg/dL — ABNORMAL LOW (ref 8.9–10.3)
Creatinine, Ser: 0.85 mg/dL (ref 0.61–1.24)
GFR calc Af Amer: >60 mL/min (ref >60)
GLUCOSE: 95 mg/dL (ref 65–99)
POTASSIUM: 3.4 mmol/L — AB (ref 3.5–5.1)
SODIUM: 139 mmol/L (ref 135–145)

## 2017-08-03 MED ORDER — POTASSIUM CHLORIDE CRYS ER 20 MEQ PO TBCR
40.0000 meq | EXTENDED_RELEASE_TABLET | Freq: Once | ORAL | Status: DC
  Filled 2017-08-03: qty 2

## 2017-08-03 MED ORDER — LOPERAMIDE HCL 2 MG PO CAPS: 2.0000 mg | ORAL_CAPSULE | ORAL | 0 refills | Status: DC | PRN

## 2017-08-03 MED ORDER — POTASSIUM CHLORIDE CRYS ER 20 MEQ PO TBCR
40.0000 meq | EXTENDED_RELEASE_TABLET | Freq: Once | ORAL | Status: AC
  Administered 2017-08-03: 40 meq via ORAL
  Filled 2017-08-03: qty 2

## 2017-08-03 NOTE — Discharge Instructions (Signed)

## 2017-08-03 NOTE — Progress Notes (Signed)
Patient alert and oriented x4. No complaints of pain, shortness of breath, chest pain, dizziness, nausea or vomiting. Patient states he has had no diarrhea episodes today. Patient tolerated PO medications well. IV discontinued and removed without complications, pressure applied. Discharge instructions gone over with patient and patient's wife. Both expressed full understanding of instructions and education on medications, appointment time, date and location and of signs and symptoms to monitor for and seek medical attention if present. Patient discharged home with all belongings with wife driving patient home.

## 2017-08-03 NOTE — Discharge Summary (Signed)
Physician Discharge Summary  Roberto Rosales RXV:[id number] DOB: 1945/10/21 DOA: 08/02/2017  PCP: Manon Hilding, MD  Admit date: 08/02/2017 Discharge date: 08/03/2017  Admitted From: Home Disposition: Home  Recommendations for Outpatient Follow-up:  1. Follow up with PCP in 1-2 weeks   Home Health: None Equipment/Devices: None  Discharge Condition: Fair CODE STATUS: Full code Diet recommendation: Regular    Discharge Diagnoses:  Principal Problem:   Enteritis due to Norovirus   Active Problems:   Acute gastroenteritis   Hyperlipemia   Glaucoma   Hypokalemia  Brief narrative/HPI 72 year old male with hyperlipidemia and glaucoma presented with several episodes of persistent watery diarrhea since the morning of admission.  Denied any sick contacts, eating outside, recent medications (including antibiotics or over-the-counter meds), recent travel, fevers, chills, nausea, vomiting, headache, blurred vision, abdominal pain, dysuria or weakness. In the ED he was febrile with temperature of 100.5 F, tachycardic to 110s with normal blood pressure.  Blood work showed mildly elevated WBC of 11.8 K.  Stool for C. difficile was negative  Was given 3 L normal saline bolus and several rounds of Imodium despite which he continued to have watery diarrhea and tachycardic. Placed on observation. GI pathogen panel came positive for neurovirus.  Hospital course Enteritis due to norovirus Placed on maintenance IV fluids.  Diarrhea resolved since last evening.  No nausea or vomiting.  Patient feels better and remains hemodynamically stable.Marland Kitchen  He stable to be discharged home and has received instructions on hand hygiene and adequate hydration.  Hypokalemia Replenished.  Remaining medical issues are stable. Follow-up with PCP in 2 weeks.  Disposition: Home Family communication: None at bedside   Discharge Instructions   Allergies as of 08/03/2017      Reactions   Codeine Nausea Only       Medication List    TAKE these medications   atorvastatin 10 MG tablet Commonly known as:  LIPITOR Take 5 mg by mouth.   latanoprost 0.005 % ophthalmic solution Commonly known as:  XALATAN Place 1 drop into both eyes at bedtime.   loperamide 2 MG capsule Commonly known as:  IMODIUM Take 1 capsule (2 mg total) by mouth as needed for diarrhea or loose stools.   multivitamin tablet Take 1 tablet by mouth daily.      Follow-up Information    [name], [name], MD. Schedule an appointment as soon as possible for a visit in 2 week(s).   Specialty:  Family Medicine Contact information: [address] [place] [address] [phone]          Allergies  Allergen Reactions  . Codeine Nausea Only      Subjective: Feels better today.  Has not had diarrhea since 9 PM last night.  Discharge Exam: Vitals:   08/02/17 2124 08/03/17 0528  BP: (!) 107/55 (!) 102/53  Pulse: 66 (!) 55  Resp: 18 17  Temp: 98.9 F (37.2 C) 97.7 F (36.5 C)  SpO2: 97% 100%   Vitals:   08/02/17 1531 08/02/17 1904 08/02/17 2124 08/03/17 0528  BP: 115/64  (!) 107/55 (!) 102/53  Pulse: 90  66 (!) 55  Resp: 18  18 17   Temp: 100 F (37.8 C) 99.7 F (37.6 C) 98.9 F (37.2 C) 97.7 F (36.5 C)  TempSrc: Oral Oral Oral Oral  SpO2: 99%  97% 100%  Weight:      Height:        General: Elderly male not in distress HEENT: Moist mucosa, supple neck Chest:  Clear bilaterally CVS: Normal S1 and S2, no murmurs GI: Soft, nondistended, nontender Musculoskeletal: Warm, no edema   The results of significant diagnostics from this hospitalization (including imaging, microbiology, ancillary and laboratory) are listed below for reference.     Microbiology: Recent Results (from the past 240 hour(s))  Gastrointestinal Panel by PCR , Stool     Status: Abnormal   Collection Time: 08/02/17  8:39 AM  Result Value Ref Range Status   Campylobacter species NOT DETECTED NOT DETECTED Final   Plesimonas  shigelloides NOT DETECTED NOT DETECTED Final   Salmonella species NOT DETECTED NOT DETECTED Final   Yersinia enterocolitica NOT DETECTED NOT DETECTED Final   Vibrio species NOT DETECTED NOT DETECTED Final   Vibrio cholerae NOT DETECTED NOT DETECTED Final   Enteroaggregative E coli (EAEC) NOT DETECTED NOT DETECTED Final   Enteropathogenic E coli (EPEC) NOT DETECTED NOT DETECTED Final   Enterotoxigenic E coli (ETEC) NOT DETECTED NOT DETECTED Final   Shiga like toxin producing E coli (STEC) NOT DETECTED NOT DETECTED Final   Shigella/Enteroinvasive E coli (EIEC) NOT DETECTED NOT DETECTED Final   Cryptosporidium NOT DETECTED NOT DETECTED Final   Cyclospora cayetanensis NOT DETECTED NOT DETECTED Final   Entamoeba histolytica NOT DETECTED NOT DETECTED Final   Giardia lamblia NOT DETECTED NOT DETECTED Final   Adenovirus F40/41 NOT DETECTED NOT DETECTED Final   Astrovirus NOT DETECTED NOT DETECTED Final   Norovirus GI/GII DETECTED (A) NOT DETECTED Final    Comment: RESULT CALLED TO, READ BACK BY AND VERIFIED WITH: TANDRA HANDY @ 1926 ON 08/02/2017 BY CAF    Rotavirus A NOT DETECTED NOT DETECTED Final   Sapovirus (I, II, IV, and V) NOT DETECTED NOT DETECTED Final    Comment: Performed at [facility], [address] [place], [place] [address]  C difficile quick scan w PCR reflex     Status: None   Collection Time: 08/02/17  8:39 AM  Result Value Ref Range Status   C Diff antigen NEGATIVE NEGATIVE Final   C Diff toxin NEGATIVE NEGATIVE Final   C Diff interpretation No C. difficile detected.  Final    Comment: Performed at [facility], [address], [place],  [address]     Labs: BNP (last 3 results) No results for input(s): BNP in the last 8760 hours. Basic Metabolic Panel: Recent Labs  Lab 08/02/17 0609 08/03/17 0457  NA 139 139  K 4.0 3.4*  CL 104 109  CO2 22 24  GLUCOSE 146* 95  BUN 21* 17  CREATININE 0.93 0.85  CALCIUM 9.3 7.3*   Liver Function  Tests: Recent Labs  Lab 08/02/17 0609  AST 33  ALT 31  ALKPHOS 73  BILITOT 1.5*  PROT 7.3  ALBUMIN 4.3   No results for input(s): LIPASE, AMYLASE in the last 168 hours. No results for input(s): AMMONIA in the last 168 hours. CBC: Recent Labs  Lab 08/02/17 0609  WBC 11.8*  NEUTROABS 11.0*  HGB 16.1  HCT 47.2  MCV 84.7  PLT 139*   Cardiac Enzymes: No results for input(s): CKTOTAL, CKMB, CKMBINDEX, TROPONINI in the last 168 hours. BNP: Invalid input(s): POCBNP CBG: No results for input(s): GLUCAP in the last 168 hours. D-Dimer No results for input(s): DDIMER in the last 72 hours. Hgb A1c No results for input(s): HGBA1C in the last 72 hours. Lipid Profile No results for input(s): CHOL, HDL, LDLCALC, TRIG, CHOLHDL, LDLDIRECT in the last 72 hours. Thyroid function studies No results for input(s):  TSH, T4TOTAL, T3FREE, THYROIDAB in the last 72 hours.  Invalid input(s): FREET3 Anemia work up No results for input(s): VITAMINB12, FOLATE, FERRITIN, TIBC, IRON, RETICCTPCT in the last 72 hours. Urinalysis No results found for: COLORURINE, APPEARANCEUR, LABSPEC, PHURINE, GLUCOSEU, HGBUR, BILIRUBINUR, KETONESUR, PROTEINUR, UROBILINOGEN, NITRITE, LEUKOCYTESUR Sepsis Labs Invalid input(s): PROCALCITONIN,  WBC,  LACTICIDVEN Microbiology Recent Results (from the past 240 hour(s))  Gastrointestinal Panel by PCR , Stool     Status: Abnormal   Collection Time: 08/02/17  8:39 AM  Result Value Ref Range Status   Campylobacter species NOT DETECTED NOT DETECTED Final   Plesimonas shigelloides NOT DETECTED NOT DETECTED Final   Salmonella species NOT DETECTED NOT DETECTED Final   Yersinia enterocolitica NOT DETECTED NOT DETECTED Final   Vibrio species NOT DETECTED NOT DETECTED Final   Vibrio cholerae NOT DETECTED NOT DETECTED Final   Enteroaggregative E coli (EAEC) NOT DETECTED NOT DETECTED Final   Enteropathogenic E coli (EPEC) NOT DETECTED NOT DETECTED Final   Enterotoxigenic E coli  (ETEC) NOT DETECTED NOT DETECTED Final   Shiga like toxin producing E coli (STEC) NOT DETECTED NOT DETECTED Final   Shigella/Enteroinvasive E coli (EIEC) NOT DETECTED NOT DETECTED Final   Cryptosporidium NOT DETECTED NOT DETECTED Final   Cyclospora cayetanensis NOT DETECTED NOT DETECTED Final   Entamoeba histolytica NOT DETECTED NOT DETECTED Final   Giardia lamblia NOT DETECTED NOT DETECTED Final   Adenovirus F40/41 NOT DETECTED NOT DETECTED Final   Astrovirus NOT DETECTED NOT DETECTED Final   Norovirus GI/GII DETECTED (A) NOT DETECTED Final    Comment: RESULT CALLED TO, READ BACK BY AND VERIFIED WITH: TANDRA HANDY @ 1926 ON 08/02/2017 BY CAF    Rotavirus A NOT DETECTED NOT DETECTED Final   Sapovirus (I, II, IV, and V) NOT DETECTED NOT DETECTED Final    Comment: Performed at [facility], [address] [place], [place] [address]  C difficile quick scan w PCR reflex     Status: None   Collection Time: 08/02/17  8:39 AM  Result Value Ref Range Status   C Diff antigen NEGATIVE NEGATIVE Final   C Diff toxin NEGATIVE NEGATIVE Final   C Diff interpretation No C. difficile detected.  Final    Comment: Performed at [facility], [address] [place], [place] [address]     Time coordinating discharge: <30 minutes  SIGNED:   Louellen Molder, MD  [facility] 08/03/2017, 9:43 AM Pager   If 7PM-7AM, please contact night-coverage [email or web address] Password [email or web address]

## 2017-08-03 NOTE — Care Management Obs Status (Signed)
[facility] NOTIFICATION   Patient Details  Name: Roberto Rosales MRN: [id number] Date of Birth: 09-27-1945   Medicare Observation Status Notification Given:  Yes    Rockie Vawter, Chauncey Reading, RN 08/03/2017, 8:56 AM

## 2017-09-02 DIAGNOSIS — Z6825 Body mass index (BMI) 25.0-25.9, adult: Secondary | ICD-10-CM | POA: Diagnosis not present

## 2017-09-02 DIAGNOSIS — A0811 Acute gastroenteropathy due to Norwalk agent: Secondary | ICD-10-CM | POA: Diagnosis not present

## 2017-09-02 DIAGNOSIS — R5383 Other fatigue: Secondary | ICD-10-CM | POA: Diagnosis not present

## 2017-09-03 DIAGNOSIS — H401131 Primary open-angle glaucoma, bilateral, mild stage: Secondary | ICD-10-CM | POA: Diagnosis not present

## 2017-09-18 DIAGNOSIS — R040 Epistaxis: Secondary | ICD-10-CM | POA: Diagnosis not present

## 2017-09-18 DIAGNOSIS — Z6825 Body mass index (BMI) 25.0-25.9, adult: Secondary | ICD-10-CM | POA: Diagnosis not present

## 2017-10-04 DIAGNOSIS — H401131 Primary open-angle glaucoma, bilateral, mild stage: Secondary | ICD-10-CM | POA: Diagnosis not present

## 2017-10-08 ENCOUNTER — Ambulatory Visit (INDEPENDENT_AMBULATORY_CARE_PROVIDER_SITE_OTHER): Payer: PPO | Admitting: Otolaryngology

## 2017-10-08 DIAGNOSIS — R040 Epistaxis: Secondary | ICD-10-CM

## 2017-10-08 DIAGNOSIS — J342 Deviated nasal septum: Secondary | ICD-10-CM | POA: Diagnosis not present

## 2017-10-10 ENCOUNTER — Emergency Department (HOSPITAL_COMMUNITY)
Admission: EM | Admit: 2017-10-10 | Discharge: 2017-10-10 | Disposition: A | Discharge status: Home / Self Care | Payer: PPO | Attending: Emergency Medicine | Admitting: Emergency Medicine

## 2017-10-10 ENCOUNTER — Encounter (HOSPITAL_COMMUNITY): Payer: Self-pay | Admitting: Emergency Medicine

## 2017-10-10 ENCOUNTER — Other Ambulatory Visit: Payer: Self-pay

## 2017-10-10 DIAGNOSIS — R040 Epistaxis: Secondary | ICD-10-CM | POA: Insufficient documentation

## 2017-10-10 DIAGNOSIS — E7800 Pure hypercholesterolemia, unspecified: Secondary | ICD-10-CM | POA: Insufficient documentation

## 2017-10-10 DIAGNOSIS — I10 Essential (primary) hypertension: Secondary | ICD-10-CM | POA: Diagnosis not present

## 2017-10-10 DIAGNOSIS — Z79899 Other long term (current) drug therapy: Secondary | ICD-10-CM | POA: Diagnosis not present

## 2017-10-10 DIAGNOSIS — E785 Hyperlipidemia, unspecified: Secondary | ICD-10-CM | POA: Diagnosis not present

## 2017-10-10 MED ORDER — OXYMETAZOLINE HCL 0.05 % NA SOLN
NASAL | Status: AC
  Filled 2017-10-10: qty 15

## 2017-10-10 MED ORDER — SILVER NITRATE-POT NITRATE 75-25 % EX MISC
CUTANEOUS | Status: AC
  Filled 2017-10-10: qty 2

## 2017-10-10 NOTE — Discharge Instructions (Addendum)
You were seen today for a recurrent nosebleed.  You need to follow-up with Dr. Deeann Saint office later today.  Try to not forcefully blowing her nose.  If you develop recurrent bleeding, use Afrin to see if this temporizes the bleed.

## 2017-10-10 NOTE — ED Provider Notes (Signed)
[facility] EMERGENCY DEPARTMENT Provider Note   CSN: [id number] Arrival date & time: 10/10/17  0403     History   Chief Complaint Chief Complaint  Patient presents with  . Epistaxis    HPI Roberto Rosales is a 72 y.o. male.  HPI  This is a 72 year old male with a history of hypertension who presents with epistaxis.  Patient reports nosebleed for the last 45 minutes.  He states that he was seen by Dr. Benjamine Mola on Monday.  At that time he had part of his right naris cauterized.  He states over the last 6 months he has had recurrent mild nosebleeds out of the right naris.  He is not on any anticoagulation.  Tonight he noted profuse bleeding from the right naris.  He reports that he felt like it was going down his throat.  He tried temporizing bleed at home with ice and squeezing his nose.  This did not help.  Past Medical History:  Diagnosis Date  . Glaucoma   . High cholesterol   . Neuropathy     Patient Active Problem List   Diagnosis Date Noted  . Enteritis due to Norovirus 08/03/2017  . Hypokalemia 08/03/2017  . Dehydration   . Acute gastroenteritis 08/02/2017  . Hyperlipemia 08/02/2017  . Glaucoma 08/02/2017    Past Surgical History:  Procedure Laterality Date  . BACK SURGERY    . CATARACT EXTRACTION Bilateral 2007  . TONSILLECTOMY  1950        Home Medications    Prior to Admission medications   Medication Sig Start Date End Date Taking? Authorizing Provider  atorvastatin (LIPITOR) 10 MG tablet Take 5 mg by mouth.  08/16/16  Yes [provider]  latanoprost (XALATAN) 0.005 % ophthalmic solution Place 1 drop into both eyes at bedtime.  07/18/16  Yes [provider]  loperamide (IMODIUM) 2 MG capsule Take 1 capsule (2 mg total) by mouth as needed for diarrhea or loose stools. 08/03/17  Yes Dhungel, Nishant, MD  Multiple Vitamin (MULTIVITAMIN) tablet Take 1 tablet by mouth daily.   Yes [provider]    Family History Family History    Problem Relation Age of Onset  . Cerebral aneurysm Mother   . Heart disease Father   . Stroke Father   . Neuropathy Neg Hx     Social History Social History   Tobacco Use  . Smoking status: Never Smoker  . Smokeless tobacco: Never Used  Substance Use Topics  . Alcohol use: No  . Drug use: No     Allergies   Codeine   Review of Systems Review of Systems  Constitutional: Negative for fever.  HENT: Positive for nosebleeds.   Respiratory: Negative for shortness of breath.   Cardiovascular: Negative for chest pain.  All other systems reviewed and are negative.    Physical Exam Updated Vital Signs BP (!) 163/92   Pulse 67   Temp 98.2 F (36.8 C) (Oral)   Resp 17   Ht 6\' 1"  (1.854 m)   Wt 86.2 kg (190 lb)   SpO2 100%   BMI 25.07 kg/m   Physical Exam  Constitutional: He is oriented to person, place, and time. He appears well-developed and well-nourished.  HENT:  Head: Normocephalic and atraumatic.  Nasal septum deviated to the left, clot noted in the right naris  Eyes: Pupils are equal, round, and reactive to light.  Neck: Neck supple.  Cardiovascular: Normal rate, regular rhythm and normal heart sounds.  Pulmonary/Chest: Effort normal. No respiratory distress.  Musculoskeletal: He exhibits no edema.  Neurological: He is alert and oriented to person, place, and time.  Skin: Skin is warm and dry.  Psychiatric: He has a normal mood and affect.  Nursing note and vitals reviewed.    ED Treatments / Results  Labs (all labs ordered are listed, but only abnormal results are displayed) Labs Reviewed - No data to display  EKG None  Radiology No results found.  Procedures .Epistaxis Management Date/Time: 10/10/2017 5:36 AM Performed by: Merryl Hacker, MD Authorized by: Merryl Hacker, MD   Consent:    Consent obtained:  Verbal   Consent given by:  Patient   Risks discussed:  Bleeding and pain   Alternatives discussed:  No  treatment Anesthesia (see MAR for exact dosages):    Anesthesia method:  None Procedure details:    Treatment site:  R anterior   Treatment method:  Silver nitrate   Treatment complexity:  Limited   Treatment episode: recurring   Post-procedure details:    Assessment:  Bleeding decreased   Patient tolerance of procedure:  Tolerated well, no immediate complications   (including critical care time)  Medications Ordered in ED Medications  oxymetazoline (AFRIN) 0.05 % nasal spray (has no administration in time range)  silver nitrate applicators 45-62 % applicator (has no administration in time range)     Initial Impression / Assessment and Plan / ED Course  I have reviewed the triage vital signs and the nursing notes.  Pertinent labs & imaging results that were available during my care of the patient were reviewed by me and considered in my medical decision making (see chart for details).     Patient presents with epistaxis.  He is nontoxic on exam and vital signs are reassuring.  He is not on any blood thinners.  Clots were evacuated.  Afrin was used.  On repeat examination, he has a large amount of friability noted over the right nasal septum without significant bleeding.  This was cauterized with silver nitrate.  On multiple rechecks, bleeding has drastically improved.  Patient does report intermittent "slight blood" however, on multiple repeat examinations, I am unable to visualize additional sources of bleeding and cauterize sites appear dry.  Recommend close follow-up with Dr. Benjamine Mola.  Patient was advised that if he rebleeds significantly he should be reevaluated immediately.  After history, exam, and medical workup I feel the patient has been appropriately medically screened and is safe for discharge home. Pertinent diagnoses were discussed with the patient. Patient was given return precautions.  Final Clinical Impressions(s) / ED Diagnoses   Final diagnoses:  Epistaxis    ED  Discharge Orders    None       Merryl Hacker, MD 10/10/17 [phone]

## 2017-10-10 NOTE — ED Triage Notes (Signed)
Patient has nosebleed for last 45 minutes, had nose cauterized by ENT physician on Monday.

## 2017-10-11 ENCOUNTER — Ambulatory Visit (HOSPITAL_COMMUNITY)
Admission: RE | Admit: 2017-10-11 | Discharge: 2017-10-11 | Disposition: A | Discharge status: Home / Self Care | Payer: PPO | Source: Ambulatory Visit | Attending: Otolaryngology | Admitting: Otolaryngology

## 2017-10-11 ENCOUNTER — Encounter (HOSPITAL_COMMUNITY)
Admission: RE | Disposition: A | Discharge status: Home / Self Care | Payer: Self-pay | Source: Ambulatory Visit | Attending: Otolaryngology

## 2017-10-11 ENCOUNTER — Ambulatory Visit (HOSPITAL_COMMUNITY): Payer: PPO | Admitting: Anesthesiology

## 2017-10-11 ENCOUNTER — Encounter (HOSPITAL_COMMUNITY): Payer: Self-pay | Admitting: Surgery

## 2017-10-11 ENCOUNTER — Other Ambulatory Visit: Payer: Self-pay

## 2017-10-11 DIAGNOSIS — H409 Unspecified glaucoma: Secondary | ICD-10-CM | POA: Diagnosis not present

## 2017-10-11 DIAGNOSIS — Z79899 Other long term (current) drug therapy: Secondary | ICD-10-CM | POA: Diagnosis not present

## 2017-10-11 DIAGNOSIS — Z885 Allergy status to narcotic agent status: Secondary | ICD-10-CM | POA: Diagnosis not present

## 2017-10-11 DIAGNOSIS — G609 Hereditary and idiopathic neuropathy, unspecified: Secondary | ICD-10-CM | POA: Insufficient documentation

## 2017-10-11 DIAGNOSIS — J342 Deviated nasal septum: Secondary | ICD-10-CM | POA: Diagnosis not present

## 2017-10-11 DIAGNOSIS — J3489 Other specified disorders of nose and nasal sinuses: Secondary | ICD-10-CM | POA: Diagnosis not present

## 2017-10-11 DIAGNOSIS — R040 Epistaxis: Secondary | ICD-10-CM | POA: Diagnosis not present

## 2017-10-11 DIAGNOSIS — J310 Chronic rhinitis: Secondary | ICD-10-CM | POA: Diagnosis not present

## 2017-10-11 HISTORY — PX: NASAL ENDOSCOPY WITH EPISTAXIS CONTROL: SHX5664

## 2017-10-11 LAB — BASIC METABOLIC PANEL
ANION GAP: 6 (ref 5–15)
BUN: 14 mg/dL (ref 6–20)
CALCIUM: 8.8 mg/dL — AB (ref 8.9–10.3)
CO2: 25 mmol/L (ref 22–32)
Chloride: 107 mmol/L (ref 101–111)
Creatinine, Ser: 0.82 mg/dL (ref 0.61–1.24)
Glucose, Bld: 97 mg/dL (ref 65–99)
Potassium: 4.2 mmol/L (ref 3.5–5.1)
SODIUM: 138 mmol/L (ref 135–145)

## 2017-10-11 LAB — CBC
HCT: 41.2 % (ref 39.0–52.0)
Hemoglobin: 14 g/dL (ref 13.0–17.0)
MCH: 28.9 pg (ref 26.0–34.0)
MCHC: 34 g/dL (ref 30.0–36.0)
MCV: 85.1 fL (ref 78.0–100.0)
Platelets: 182 10*3/uL (ref 150–400)
RBC: 4.84 MIL/uL (ref 4.22–5.81)
RDW: 13.2 % (ref 11.5–15.5)
WBC: 8.8 10*3/uL (ref 4.0–10.5)

## 2017-10-11 SURGERY — CONTROL OF EPISTAXIS, ENDOSCOPIC
Anesthesia: General | Site: Nose | Laterality: Right

## 2017-10-11 MED ORDER — OXYCODONE-ACETAMINOPHEN 5-325 MG PO TABS: 1.0000 | ORAL_TABLET | ORAL | 0 refills | Status: DC | PRN

## 2017-10-11 MED ORDER — ONDANSETRON HCL 4 MG/2ML IJ SOLN
INTRAMUSCULAR | Status: DC | PRN
  Administered 2017-10-11: 4 mg via INTRAVENOUS

## 2017-10-11 MED ORDER — FENTANYL CITRATE (PF) 100 MCG/2ML IJ SOLN: 25.0000 ug | INTRAMUSCULAR | Status: DC | PRN

## 2017-10-11 MED ORDER — LIDOCAINE HCL (CARDIAC) PF 100 MG/5ML IV SOSY
PREFILLED_SYRINGE | INTRAVENOUS | Status: DC | PRN
  Administered 2017-10-11: 30 mg via INTRAVENOUS

## 2017-10-11 MED ORDER — OXYMETAZOLINE HCL 0.05 % NA SOLN
NASAL | Status: AC
  Filled 2017-10-11: qty 15

## 2017-10-11 MED ORDER — DEXAMETHASONE SODIUM PHOSPHATE 10 MG/ML IJ SOLN
INTRAMUSCULAR | Status: DC | PRN
  Administered 2017-10-11: 10 mg via INTRAVENOUS

## 2017-10-11 MED ORDER — MUPIROCIN 2 % EX OINT
TOPICAL_OINTMENT | CUTANEOUS | Status: AC
  Filled 2017-10-11: qty 22

## 2017-10-11 MED ORDER — PROPOFOL 10 MG/ML IV BOLUS
INTRAVENOUS | Status: DC | PRN
  Administered 2017-10-11: 120 mg via INTRAVENOUS
  Administered 2017-10-11: 30 mg via INTRAVENOUS

## 2017-10-11 MED ORDER — FENTANYL CITRATE (PF) 100 MCG/2ML IJ SOLN
INTRAMUSCULAR | Status: DC | PRN
  Administered 2017-10-11: 50 ug via INTRAVENOUS

## 2017-10-11 MED ORDER — LACTATED RINGERS IV SOLN
INTRAVENOUS | Status: DC
  Administered 2017-10-11 (×2): via INTRAVENOUS

## 2017-10-11 MED ORDER — LIDOCAINE-EPINEPHRINE 1 %-1:100000 IJ SOLN
INTRAMUSCULAR | Status: AC
  Filled 2017-10-11: qty 1

## 2017-10-11 MED ORDER — SUCCINYLCHOLINE CHLORIDE 20 MG/ML IJ SOLN
INTRAMUSCULAR | Status: DC | PRN
  Administered 2017-10-11: 100 mg via INTRAVENOUS

## 2017-10-11 MED ORDER — 0.9 % SODIUM CHLORIDE (POUR BTL) OPTIME
TOPICAL | Status: DC | PRN
  Administered 2017-10-11: 1000 mL

## 2017-10-11 MED ORDER — MUPIROCIN CALCIUM 2 % EX CREA
TOPICAL_CREAM | CUTANEOUS | Status: DC | PRN
  Administered 2017-10-11: 1 via TOPICAL

## 2017-10-11 MED ORDER — FENTANYL CITRATE (PF) 250 MCG/5ML IJ SOLN
INTRAMUSCULAR | Status: AC
  Filled 2017-10-11: qty 5

## 2017-10-11 MED ORDER — OXYMETAZOLINE HCL 0.05 % NA SOLN
NASAL | Status: DC | PRN
  Administered 2017-10-11 (×2): 1 via TOPICAL

## 2017-10-11 SURGICAL SUPPLY — 31 items
BLADE SURG 15 STRL LF DISP TIS (BLADE) ×1 IMPLANT
BLADE SURG 15 STRL SS (BLADE) ×3
CANISTER SUCT 3000ML PPV (MISCELLANEOUS) ×3 IMPLANT
COAGULATOR SUCT 8FR VV (MISCELLANEOUS) ×3 IMPLANT
CONT SPEC 4OZ CLIKSEAL STRL BL (MISCELLANEOUS) ×3 IMPLANT
COVER MAYO STAND STRL (DRAPES) ×3 IMPLANT
DRAPE HALF SHEET 40X57 (DRAPES) IMPLANT
DRESSING NASAL POPE 10X1.5X2.5 (GAUZE/BANDAGES/DRESSINGS) ×1 IMPLANT
DRSG NASAL POPE 10X1.5X2.5 (GAUZE/BANDAGES/DRESSINGS) ×3
GAUZE SPONGE 2X2 8PLY STRL LF (GAUZE/BANDAGES/DRESSINGS) ×2 IMPLANT
GAUZE SPONGE 4X4 16PLY XRAY LF (GAUZE/BANDAGES/DRESSINGS) ×3 IMPLANT
GAUZE VASELINE FOILPK 1/2 X 72 (GAUZE/BANDAGES/DRESSINGS) IMPLANT
GLOVE BIOGEL M 7.0 STRL (GLOVE) ×3 IMPLANT
GOWN STRL REUS W/ TWL LRG LVL3 (GOWN DISPOSABLE) ×2 IMPLANT
GOWN STRL REUS W/TWL LRG LVL3 (GOWN DISPOSABLE) ×6
HEMOSTAT SURGICEL .5X2 ABSORB (HEMOSTASIS) IMPLANT
KIT BASIN OR (CUSTOM PROCEDURE TRAY) ×3 IMPLANT
KIT TURNOVER KIT B (KITS) ×3 IMPLANT
NEEDLE HYPO 25GX1X1/2 BEV (NEEDLE) ×3 IMPLANT
NS IRRIG 1000ML POUR BTL (IV SOLUTION) ×3 IMPLANT
PAD ARMBOARD 7.5X6 YLW CONV (MISCELLANEOUS) ×6 IMPLANT
SPLINT NASAL THERMO PLAST (MISCELLANEOUS) IMPLANT
SPONGE GAUZE 2X2 STER 10/PKG (GAUZE/BANDAGES/DRESSINGS) ×4
SPONGE NEURO XRAY DETECT 1X3 (DISPOSABLE) ×3 IMPLANT
SYR CONTROL 10ML LL (SYRINGE) ×3 IMPLANT
TAPE SURG TRANSPORE 1 IN (GAUZE/BANDAGES/DRESSINGS) ×1 IMPLANT
TAPE SURGICAL TRANSPORE 1 IN (GAUZE/BANDAGES/DRESSINGS) ×2
TOWEL OR 17X24 6PK STRL BLUE (TOWEL DISPOSABLE) ×6 IMPLANT
TUBE CONNECTING 12'X1/4 (SUCTIONS) ×1
TUBE CONNECTING 12X1/4 (SUCTIONS) ×2 IMPLANT
WATER STERILE IRR 1000ML POUR (IV SOLUTION) IMPLANT

## 2017-10-11 NOTE — Anesthesia Procedure Notes (Signed)
Procedure Name: Intubation Date/Time: 10/11/2017 7:56 PM Performed by: Babs Bertin, CRNA Pre-anesthesia Checklist: Patient identified, Emergency Drugs available, Suction available and Patient being monitored Patient Re-evaluated:Patient Re-evaluated prior to induction Oxygen Delivery Method: Circle System Utilized Preoxygenation: Pre-oxygenation with 100% oxygen Induction Type: IV induction, Rapid sequence and Cricoid Pressure applied Ventilation: Mask ventilation without difficulty Laryngoscope Size: Mac and 3 Grade View: Grade I Tube type: Oral Tube size: 7.5 mm Number of attempts: 1 Airway Equipment and Method: Stylet and Oral airway Placement Confirmation: ETT inserted through vocal cords under direct vision,  positive ETCO2 and breath sounds checked- equal and bilateral Secured at: 22 cm Tube secured with: Tape Dental Injury: Teeth and Oropharynx as per pre-operative assessment

## 2017-10-11 NOTE — Anesthesia Preprocedure Evaluation (Signed)
Anesthesia Evaluation  Patient identified by MRN, date of birth, ID band Patient awake    Reviewed: Allergy & Precautions, H&P , NPO status , Patient's Chart, lab work & pertinent test results  Airway Mallampati: II  TM Distance: >3 FB Neck ROM: Full    Dental no notable dental hx. (+) Teeth Intact, Dental Advisory Given   Pulmonary neg pulmonary ROS,    Pulmonary exam normal breath sounds clear to auscultation       Cardiovascular negative cardio ROS   Rhythm:Regular Rate:Normal     Neuro/Psych negative neurological ROS  negative psych ROS   GI/Hepatic negative GI ROS, Neg liver ROS,   Endo/Other  negative endocrine ROS  Renal/GU negative Renal ROS  negative genitourinary   Musculoskeletal   Abdominal   Peds  Hematology negative hematology ROS (+)   Anesthesia Other Findings   Reproductive/Obstetrics negative OB ROS                             Anesthesia Physical Anesthesia Plan  ASA: II  Anesthesia Plan: General   Post-op Pain Management:    Induction: Intravenous, Rapid sequence and Cricoid pressure planned  PONV Risk Score and Plan: 3 and Ondansetron, Dexamethasone and Treatment may vary due to age or medical condition  Airway Management Planned: Oral ETT  Additional Equipment:   Intra-op Plan:   Post-operative Plan: Extubation in OR  Informed Consent: I have reviewed the patients History and Physical, chart, labs and discussed the procedure including the risks, benefits and alternatives for the proposed anesthesia with the patient or authorized representative who has indicated his/her understanding and acceptance.   Dental advisory given  Plan Discussed with: CRNA  Anesthesia Plan Comments:         Anesthesia Quick Evaluation

## 2017-10-11 NOTE — Anesthesia Postprocedure Evaluation (Signed)
Anesthesia Post Note  Patient: Roberto Rosales  Procedure(s) Performed: NASAL CAUTERY AND  BIOPSY OF RIGHT NASAL GRANULATION TISSUE (Right Nose)     Patient location during evaluation: PACU Anesthesia Type: General Level of consciousness: awake and alert, oriented and patient cooperative Pain management: pain level controlled Vital Signs Assessment: post-procedure vital signs reviewed and stable Respiratory status: spontaneous breathing, nonlabored ventilation and respiratory function stable Cardiovascular status: blood pressure returned to baseline and stable Postop Assessment: no apparent nausea or vomiting Anesthetic complications: no Comments: Dr. Benjamine Mola aware pt's nose still oozing    Last Vitals:  Vitals:   10/11/17 2044 10/11/17 2056  BP: (!) 147/82 (!) 150/82  Pulse: 92 85  Resp: 16 17  Temp:    SpO2: 96% 94%    Last Pain:  Vitals:   10/11/17 2041  TempSrc:   PainSc: 0-No pain                 Tahje Borawski,E. Jatavia Keltner

## 2017-10-11 NOTE — H&P (View-Only) (Signed)
Cc: Recurrent right epistaxis  HPI: The patient is a 72 y/o male who presents today for evaluation of recurrent epistaxis. The patient is seen in consultation requested by Dr. Consuello Masse. The patient has noted recurrent right nasal bleeding for the past 6 months.  The patient denies nasal trauma. He is not currently on any blood thinners and has no history of coagulopathy. The patient has noted a nasal deformity for many years. His nose was last treated earlier this week. The patient had a tonsillectomy in the past.   The patient's review of systems (constitutional, eyes, ENT, cardiovascular, respiratory, GI, musculoskeletal, skin, neurologic, psychiatric, endocrine, hematologic, allergic) is noted in the ROS questionnaire.  It is reviewed with the patient.   Family health history: None.  Major events: Tonsillectomy, cataract surgery, back surgery.  Ongoing medical problems: Glaucoma, idiopathic neuropathy.  Social history: The patient is married.  He denies the use of tobacco, alcohol, or illegal drugs.  Exam General: Communicates without difficulty, well nourished, no acute distress. Head: Normocephalic, no evidence injury, no tenderness, facial buttresses intact without stepoff. Eyes: PERRL, EOMI. No scleral icterus, conjunctivae clear. Neuro: CN II exam reveals vision grossly intact.  No nystagmus at any point of gaze. Ears: Auricles well formed without lesions.  Ear canals are intact without mass or lesion.  No erythema or edema is appreciated.  The TMs are intact without fluid. Nose: External evaluation reveals normal support and skin without lesions.  Dorsum is intact with leftward deformity.  Anterior rhinoscopy reveals hypervascular areas and granulation tissue on nasal septum on the right. Severe septal deviation. Oral:  Oral cavity and oropharynx are intact, symmetric, without erythema or edema.  Mucosa is moist without lesions. Neck: Full range of motion without pain.  There is no  significant lymphadenopathy.  No masses palpable.  Thyroid bed within normal limits to palpation.  Parotid glands and submandibular glands equal bilaterally without mass.  Trachea is midline. Neuro:  CN 2-12 grossly intact. Gait normal. Vestibular: No nystagmus at any point of gaze.   Assessment 1. Several hypervascular areas and granulation tissues are noted along the right nasal septum. The patient is noted to have a severe septal deviation.   Plan: 1. Frequent recurrent severe right epistaxis. 2. Granulation tissue is also noted on the right nasal septum. 3. Plan endoscopic electrocauterization of the right nasal septum in the operating room. Biopsy specimens will also be obtained from the granulation tissue.

## 2017-10-11 NOTE — Transfer of Care (Signed)
Immediate Anesthesia Transfer of Care Note  Patient: Roberto Rosales  Procedure(s) Performed: NASAL CAUTERY AND  BIOPSY OF RIGHT NASAL GRANULATION TISSUE (Right Nose)  Patient Location: PACU  Anesthesia Type:General  Level of Consciousness: awake, alert  and patient cooperative  Airway & Oxygen Therapy: Patient Spontanous Breathing  Post-op Assessment: Report given to RN and Post -op Vital signs reviewed and stable  Post vital signs: Reviewed and stable  Last Vitals:  Vitals Value Taken Time  BP 149/133 10/11/2017  8:41 PM  Temp 36.5 C 10/11/2017  8:41 PM  Pulse 92 10/11/2017  8:43 PM  Resp 12 10/11/2017  8:43 PM  SpO2 98 % 10/11/2017  8:43 PM  Vitals shown include unvalidated device data.  Last Pain:  Vitals:   10/11/17 2041  TempSrc:   PainSc: 0-No pain      Patients Stated Pain Goal: 3 (52/77/82 4235)  Complications: No apparent anesthesia complications

## 2017-10-11 NOTE — Op Note (Signed)
DATE OF PROCEDURE:  10/11/2017                              OPERATIVE REPORT  SURGEON:  [name], MD  PREOPERATIVE DIAGNOSES: 1. Severe recurrent right epistaxis  POSTOPERATIVE DIAGNOSES: 1. Severe recurrent right epistaxis  PROCEDURE PERFORMED: Endoscopic control of right nasal hemorrhage  ANESTHESIA:  General endotracheal tube anesthesia.  COMPLICATIONS:  None.  ESTIMATED BLOOD LOSS:  124ml  INDICATION FOR PROCEDURE:  Roberto Rosales is a 72 y.o. male with a history of recurrent right epistaxis. He was previously treated with multiple cauterization and nasal packing. He continued to have significant bleeding from the right nasal cavity. Based on the above findings, the decision was made for the patient to undergo the above-stated procedure.   The risks, benefits, alternatives, and details of the procedure were discussed with the patient.  Questions were invited and answered.  Informed consent was obtained.  DESCRIPTION:  The patient was taken to the operating room and placed supine on the operating table.  General endotracheal tube anesthesia was administered by the anesthesiologist.  The patient was positioned and prepped and draped in a standard fashion for nasal surgery.   The previously placed right nasal packing was removed. Profuse bleeding was noted from the right nasal cavity. Using a 0 endoscope, the right nasal cavity was examined. Active bleeding was noted from the right posterior inferior nasal septum. Granulation tissue was noted at the bleeding site. 2 biopsy specimens were obtained from the granulation tissue. Severe nasal septal deviation was also noted. The bleeding sites were extensively cauterized with a suction electrocautery device. A 10 cm Merocel packing was placed.  The care of the patient was turned over to the anesthesiologist.  The patient was awakened from anesthesia without difficulty.  The patient was extubated and transferred to the recovery room in good  condition.  OPERATIVE FINDINGS:  Profuse bleeding was noted from the right posterior inferior nasal septum. Granulation tissue was noted at the bleeding site. 2 biopsy specimens were obtained.  SPECIMEN:  Right nasal septal granulation tissue.  FOLLOWUP CARE:  The patient will be discharged home once awake and alert.   The patient will follow up in my office next week for packing removal.  Burley Saver 10/11/2017 8:39 PM

## 2017-10-11 NOTE — Discharge Instructions (Signed)
The patient may resume his previous activities and diet. He should change the nasal drip-pad as needed.

## 2017-10-11 NOTE — H&P (Signed)
Cc: Recurrent right epistaxis  HPI: The patient is a 72 y/o male who presents today for evaluation of recurrent epistaxis. The patient is seen in consultation requested by Dr. Consuello Masse. The patient has noted recurrent right nasal bleeding for the past 6 months.  The patient denies nasal trauma. He is not currently on any blood thinners and has no history of coagulopathy. The patient has noted a nasal deformity for many years. His nose was last treated earlier this week. The patient had a tonsillectomy in the past.   The patient's review of systems (constitutional, eyes, ENT, cardiovascular, respiratory, GI, musculoskeletal, skin, neurologic, psychiatric, endocrine, hematologic, allergic) is noted in the ROS questionnaire.  It is reviewed with the patient.   Family health history: None.  Major events: Tonsillectomy, cataract surgery, back surgery.  Ongoing medical problems: Glaucoma, idiopathic neuropathy.  Social history: The patient is married.  He denies the use of tobacco, alcohol, or illegal drugs.  Exam General: Communicates without difficulty, well nourished, no acute distress. Head: Normocephalic, no evidence injury, no tenderness, facial buttresses intact without stepoff. Eyes: PERRL, EOMI. No scleral icterus, conjunctivae clear. Neuro: CN II exam reveals vision grossly intact.  No nystagmus at any point of gaze. Ears: Auricles well formed without lesions.  Ear canals are intact without mass or lesion.  No erythema or edema is appreciated.  The TMs are intact without fluid. Nose: External evaluation reveals normal support and skin without lesions.  Dorsum is intact with leftward deformity.  Anterior rhinoscopy reveals hypervascular areas and granulation tissue on nasal septum on the right. Severe septal deviation. Oral:  Oral cavity and oropharynx are intact, symmetric, without erythema or edema.  Mucosa is moist without lesions. Neck: Full range of motion without pain.  There is no  significant lymphadenopathy.  No masses palpable.  Thyroid bed within normal limits to palpation.  Parotid glands and submandibular glands equal bilaterally without mass.  Trachea is midline. Neuro:  CN 2-12 grossly intact. Gait normal. Vestibular: No nystagmus at any point of gaze.   Assessment 1. Several hypervascular areas and granulation tissues are noted along the right nasal septum. The patient is noted to have a severe septal deviation.   Plan: 1. Frequent recurrent severe right epistaxis. 2. Granulation tissue is also noted on the right nasal septum. 3. Plan endoscopic electrocauterization of the right nasal septum in the operating room. Biopsy specimens will also be obtained from the granulation tissue.

## 2017-10-12 ENCOUNTER — Encounter (HOSPITAL_COMMUNITY): Payer: Self-pay | Admitting: Otolaryngology

## 2017-10-12 DIAGNOSIS — R040 Epistaxis: Secondary | ICD-10-CM | POA: Diagnosis not present

## 2017-10-15 ENCOUNTER — Ambulatory Visit (INDEPENDENT_AMBULATORY_CARE_PROVIDER_SITE_OTHER): Payer: PPO | Admitting: Otolaryngology

## 2017-10-15 DIAGNOSIS — R040 Epistaxis: Secondary | ICD-10-CM | POA: Diagnosis not present

## 2017-10-16 ENCOUNTER — Other Ambulatory Visit: Payer: Self-pay | Admitting: Otolaryngology

## 2017-10-16 ENCOUNTER — Other Ambulatory Visit: Payer: Self-pay

## 2017-10-16 ENCOUNTER — Encounter (HOSPITAL_BASED_OUTPATIENT_CLINIC_OR_DEPARTMENT_OTHER): Payer: Self-pay | Admitting: *Deleted

## 2017-10-22 ENCOUNTER — Encounter (HOSPITAL_BASED_OUTPATIENT_CLINIC_OR_DEPARTMENT_OTHER): Payer: Self-pay

## 2017-10-22 ENCOUNTER — Encounter (HOSPITAL_BASED_OUTPATIENT_CLINIC_OR_DEPARTMENT_OTHER)
Admission: RE | Disposition: A | Discharge status: Home / Self Care | Payer: Self-pay | Source: Ambulatory Visit | Attending: Otolaryngology

## 2017-10-22 ENCOUNTER — Ambulatory Visit (HOSPITAL_BASED_OUTPATIENT_CLINIC_OR_DEPARTMENT_OTHER)
Admission: RE | Admit: 2017-10-22 | Discharge: 2017-10-22 | Disposition: A | Discharge status: Home / Self Care | Payer: PPO | Source: Ambulatory Visit | Attending: Otolaryngology | Admitting: Otolaryngology

## 2017-10-22 ENCOUNTER — Ambulatory Visit (HOSPITAL_BASED_OUTPATIENT_CLINIC_OR_DEPARTMENT_OTHER): Payer: PPO | Admitting: Anesthesiology

## 2017-10-22 ENCOUNTER — Other Ambulatory Visit: Payer: Self-pay

## 2017-10-22 DIAGNOSIS — J342 Deviated nasal septum: Secondary | ICD-10-CM | POA: Insufficient documentation

## 2017-10-22 DIAGNOSIS — Z79899 Other long term (current) drug therapy: Secondary | ICD-10-CM | POA: Insufficient documentation

## 2017-10-22 DIAGNOSIS — G609 Hereditary and idiopathic neuropathy, unspecified: Secondary | ICD-10-CM | POA: Insufficient documentation

## 2017-10-22 DIAGNOSIS — E876 Hypokalemia: Secondary | ICD-10-CM | POA: Diagnosis not present

## 2017-10-22 DIAGNOSIS — H409 Unspecified glaucoma: Secondary | ICD-10-CM | POA: Insufficient documentation

## 2017-10-22 DIAGNOSIS — R040 Epistaxis: Secondary | ICD-10-CM | POA: Insufficient documentation

## 2017-10-22 DIAGNOSIS — E860 Dehydration: Secondary | ICD-10-CM | POA: Diagnosis not present

## 2017-10-22 DIAGNOSIS — E785 Hyperlipidemia, unspecified: Secondary | ICD-10-CM | POA: Diagnosis not present

## 2017-10-22 HISTORY — PX: NASAL ENDOSCOPY WITH EPISTAXIS CONTROL: SHX5664

## 2017-10-22 HISTORY — DX: Epistaxis: R04.0

## 2017-10-22 LAB — POCT I-STAT, CHEM 8
BUN: 12 mg/dL (ref 6–20)
CREATININE: 0.7 mg/dL (ref 0.61–1.24)
Calcium, Ion: 1.18 mmol/L (ref 1.15–1.40)
Chloride: 106 mmol/L (ref 101–111)
GLUCOSE: 111 mg/dL — AB (ref 65–99)
HEMATOCRIT: 39 % (ref 39.0–52.0)
Hemoglobin: 13.3 g/dL (ref 13.0–17.0)
POTASSIUM: 4.3 mmol/L (ref 3.5–5.1)
Sodium: 141 mmol/L (ref 135–145)
TCO2: 24 mmol/L (ref 22–32)

## 2017-10-22 SURGERY — CONTROL OF EPISTAXIS, ENDOSCOPIC
Anesthesia: General | Site: Nose | Laterality: Right

## 2017-10-22 MED ORDER — OXYCODONE HCL 5 MG PO TABS: 5.0000 mg | ORAL_TABLET | Freq: Once | ORAL | Status: DC | PRN

## 2017-10-22 MED ORDER — DEXAMETHASONE SODIUM PHOSPHATE 4 MG/ML IJ SOLN
INTRAMUSCULAR | Status: DC | PRN
  Administered 2017-10-22: 10 mg via INTRAVENOUS

## 2017-10-22 MED ORDER — PROMETHAZINE HCL 25 MG/ML IJ SOLN: 6.2500 mg | INTRAMUSCULAR | Status: DC | PRN

## 2017-10-22 MED ORDER — DEXAMETHASONE SODIUM PHOSPHATE 10 MG/ML IJ SOLN
INTRAMUSCULAR | Status: AC
  Filled 2017-10-22: qty 1

## 2017-10-22 MED ORDER — LACTATED RINGERS IV SOLN
INTRAVENOUS | Status: DC
  Administered 2017-10-22: 11:00:00 via INTRAVENOUS

## 2017-10-22 MED ORDER — OXYCODONE HCL 5 MG/5ML PO SOLN: 5.0000 mg | Freq: Once | ORAL | Status: DC | PRN

## 2017-10-22 MED ORDER — BACITRACIN ZINC 500 UNIT/GM EX OINT
TOPICAL_OINTMENT | CUTANEOUS | Status: DC | PRN
  Administered 2017-10-22: 1 via TOPICAL

## 2017-10-22 MED ORDER — MEPERIDINE HCL 25 MG/ML IJ SOLN: 6.2500 mg | INTRAMUSCULAR | Status: DC | PRN

## 2017-10-22 MED ORDER — LIDOCAINE HCL (CARDIAC) PF 100 MG/5ML IV SOSY
PREFILLED_SYRINGE | INTRAVENOUS | Status: AC
Start: 2017-10-22 — End: ?
  Filled 2017-10-22: qty 5

## 2017-10-22 MED ORDER — OXYMETAZOLINE HCL 0.05 % NA SOLN
NASAL | Status: DC | PRN
  Administered 2017-10-22: 1 via TOPICAL

## 2017-10-22 MED ORDER — SUCCINYLCHOLINE CHLORIDE 200 MG/10ML IV SOSY
PREFILLED_SYRINGE | INTRAVENOUS | Status: AC
  Filled 2017-10-22: qty 10

## 2017-10-22 MED ORDER — FENTANYL CITRATE (PF) 100 MCG/2ML IJ SOLN: 25.0000 ug | INTRAMUSCULAR | Status: DC | PRN

## 2017-10-22 MED ORDER — FENTANYL CITRATE (PF) 100 MCG/2ML IJ SOLN: 50.0000 ug | INTRAMUSCULAR | Status: DC | PRN

## 2017-10-22 MED ORDER — SCOPOLAMINE 1 MG/3DAYS TD PT72: 1.0000 | MEDICATED_PATCH | Freq: Once | TRANSDERMAL | Status: DC | PRN

## 2017-10-22 MED ORDER — SUCCINYLCHOLINE CHLORIDE 20 MG/ML IJ SOLN
INTRAMUSCULAR | Status: DC | PRN
  Administered 2017-10-22: 80 mg via INTRAVENOUS

## 2017-10-22 MED ORDER — PROPOFOL 10 MG/ML IV BOLUS
INTRAVENOUS | Status: DC | PRN
  Administered 2017-10-22: 120 mg via INTRAVENOUS

## 2017-10-22 MED ORDER — FENTANYL CITRATE (PF) 100 MCG/2ML IJ SOLN
INTRAMUSCULAR | Status: AC
  Filled 2017-10-22: qty 2

## 2017-10-22 MED ORDER — MIDAZOLAM HCL 2 MG/2ML IJ SOLN: 1.0000 mg | INTRAMUSCULAR | Status: DC | PRN

## 2017-10-22 MED ORDER — ONDANSETRON HCL 4 MG/2ML IJ SOLN
INTRAMUSCULAR | Status: AC
  Filled 2017-10-22: qty 2

## 2017-10-22 MED ORDER — OXYMETAZOLINE HCL 0.05 % NA SOLN
NASAL | Status: AC
  Filled 2017-10-22: qty 15

## 2017-10-22 SURGICAL SUPPLY — 29 items
APL SWBSTK 6 STRL LF DISP (MISCELLANEOUS) ×1
APPLICATOR COTTON TIP 6 STRL (MISCELLANEOUS) ×1 IMPLANT
APPLICATOR COTTON TIP 6IN STRL (MISCELLANEOUS) ×2
CANISTER SUCT 1200ML W/VALVE (MISCELLANEOUS) ×2 IMPLANT
COAGULATOR SUCT 8FR VV (MISCELLANEOUS) ×2 IMPLANT
CONT SPEC 4OZ CLIKSEAL STRL BL (MISCELLANEOUS) IMPLANT
DECANTER SPIKE VIAL GLASS SM (MISCELLANEOUS) IMPLANT
DEPRESSOR TONGUE BLADE STERILE (MISCELLANEOUS) IMPLANT
DRSG TELFA 3X8 NADH (GAUZE/BANDAGES/DRESSINGS) IMPLANT
ELECT REM PT RETURN 9FT ADLT (ELECTROSURGICAL) ×2
ELECT REM PT RETURN 9FT PED (ELECTROSURGICAL)
ELECTRODE REM PT RETRN 9FT PED (ELECTROSURGICAL) IMPLANT
ELECTRODE REM PT RTRN 9FT ADLT (ELECTROSURGICAL) ×1 IMPLANT
GAUZE SPONGE 4X4 12PLY STRL (GAUZE/BANDAGES/DRESSINGS) IMPLANT
GLOVE BIO SURGEON STRL SZ7.5 (GLOVE) ×2 IMPLANT
GLOVE ECLIPSE 6.5 STRL STRAW (GLOVE) ×2 IMPLANT
GOWN STRL REUS W/ TWL LRG LVL3 (GOWN DISPOSABLE) ×1 IMPLANT
GOWN STRL REUS W/TWL LRG LVL3 (GOWN DISPOSABLE) ×2
HEMOSTAT SNOW SURGICEL 2X4 (HEMOSTASIS) ×2 IMPLANT
MARKER SKIN DUAL TIP RULER LAB (MISCELLANEOUS) IMPLANT
PACK BASIN DAY SURGERY FS (CUSTOM PROCEDURE TRAY) ×2 IMPLANT
PACKING NASAL EPIS 4X2.4 XEROG (MISCELLANEOUS) IMPLANT
SHEET MEDIUM DRAPE 40X70 STRL (DRAPES) ×2 IMPLANT
SOLUTION BUTLER CLEAR DIP (MISCELLANEOUS) ×2 IMPLANT
SPONGE GAUZE 2X2 8PLY STRL LF (GAUZE/BANDAGES/DRESSINGS) ×2 IMPLANT
SPONGE NEURO XRAY DETECT 1X3 (DISPOSABLE) ×2 IMPLANT
SYR BULB 3OZ (MISCELLANEOUS) ×2 IMPLANT
TOWEL GREEN STERILE FF (TOWEL DISPOSABLE) ×2 IMPLANT
TUBE CONNECTING 20X1/4 (TUBING) ×2 IMPLANT

## 2017-10-22 NOTE — Anesthesia Postprocedure Evaluation (Signed)
Anesthesia Post Note  Patient: Roberto Rosales  Procedure(s) Performed: NASAL ENDOSCOPY WITH RIGHT EPISTAXIS CONTROL (Right Nose)     Patient location during evaluation: PACU Anesthesia Type: General Level of consciousness: sedated and patient cooperative Pain management: pain level controlled Vital Signs Assessment: post-procedure vital signs reviewed and stable Respiratory status: spontaneous breathing Cardiovascular status: stable Anesthetic complications: no    Last Vitals:  Vitals:   10/22/17 1245 10/22/17 1305  BP: 132/66 139/68  Pulse: (!) 55 (!) 55  Resp: 12 14  Temp:  37.1 C  SpO2: 98% 99%    Last Pain:  Vitals:   10/22/17 1305  TempSrc:   PainSc: 2                  Nolon Nations

## 2017-10-22 NOTE — Interval H&P Note (Signed)
History and Physical Interval Note:  10/22/2017 10:42 AM  Roberto Rosales  has presented today for surgery, with the diagnosis of EPISTAXIS  The various methods of treatment have been discussed with the patient and family. After consideration of risks, benefits and other options for treatment, the patient has consented to  Procedure(s): NASAL ENDOSCOPY WITH EPISTAXIS CONTROL (N/A) as a surgical intervention .  The patient's history has been reviewed, patient examined, no change in status, stable for surgery.  I have reviewed the patient's chart and labs.  Questions were answered to the patient's satisfaction.     Domanic Matusek Cassie Freer

## 2017-10-22 NOTE — Op Note (Addendum)
DATE OF PROCEDURE:  10/22/2017                              OPERATIVE REPORT  SURGEON:  [name], MD  PREOPERATIVE DIAGNOSES: 1. Severe recurrent right epistaxis  POSTOPERATIVE DIAGNOSES: 1. Severe recurrent right epistaxis  PROCEDURE PERFORMED: Repeat endoscopic control of right nasal hemorrhage  ANESTHESIA:  General endotracheal tube anesthesia.  COMPLICATIONS:  None.  ESTIMATED BLOOD LOSS:  154ml  INDICATION FOR PROCEDURE:  Roberto Rosales is a 72 y.o. male with a history of recurrent right epistaxis. He was previously treated with multiple cauterization and nasal packing. He continued to have repeat bleeding from the right nasal cavity. The last packing was placed 1 week ago. Based on the above findings, the decision was made for the patient to undergo the above-stated procedure.   The risks, benefits, alternatives, and details of the procedure were discussed with the patient.  Questions were invited and answered.  Informed consent was obtained.  DESCRIPTION:  The patient was taken to the operating room and placed supine on the operating table.  General endotracheal tube anesthesia was administered by the anesthesiologist.  The patient was positioned and prepped and draped in a standard fashion for nasal surgery.   The previously placed right nasal packing was removed. A small amount of bleeding was noted from the right nasal cavity. Using a 0 endoscope, the right nasal cavity was examined. Slow bleeding was noted from the right anterior and posterior nasal septum.  The bleeding sites were cauterized with a suction electrocautery device. Surgicel snow agent was applied to the right nasal septum.  The care of the patient was turned over to the anesthesiologist.  The patient was awakened from anesthesia without difficulty.  The patient was extubated and transferred to the recovery room in good condition.  OPERATIVE FINDINGS:  Recurrent right nasal bleeding.  SPECIMEN:   None  FOLLOWUP CARE:  The patient will be discharged home once awake and alert.   The patient will follow up in my office in approximately 2 weeks.    [name] Cassie Freer 10/22/2017 12:13 PM

## 2017-10-22 NOTE — Discharge Instructions (Addendum)
The patient may resume all his previous activities and diet. He will follow-up in my office in 2 weeks.   Post Anesthesia Home Care Instructions  Activity: Get plenty of rest for the remainder of the day. A responsible individual must stay with you for 24 hours following the procedure.  For the next 24 hours, DO NOT: -Drive a car -Paediatric nurse -Drink alcoholic beverages -Take any medication unless instructed by your physician -Make any legal decisions or sign important papers.  Meals: Start with liquid foods such as gelatin or soup. Progress to regular foods as tolerated. Avoid greasy, spicy, heavy foods. If nausea and/or vomiting occur, drink only clear liquids until the nausea and/or vomiting subsides. Call your physician if vomiting continues.  Special Instructions/Symptoms: Your throat may feel dry or sore from the anesthesia or the breathing tube placed in your throat during surgery. If this causes discomfort, gargle with warm salt water. The discomfort should disappear within 24 hours.  If you had a scopolamine patch placed behind your ear for the management of post- operative nausea and/or vomiting:  1. The medication in the patch is effective for 72 hours, after which it should be removed.  Wrap patch in a tissue and discard in the trash. Wash hands thoroughly with soap and water. 2. You may remove the patch earlier than 72 hours if you experience unpleasant side effects which may include dry mouth, dizziness or visual disturbances. 3. Avoid touching the patch. Wash your hands with soap and water after contact with the patch.

## 2017-10-22 NOTE — Anesthesia Procedure Notes (Signed)
Procedure Name: Intubation Date/Time: 10/22/2017 11:59 AM Performed by: Maryella Shivers, CRNA Pre-anesthesia Checklist: Patient identified, Emergency Drugs available, Suction available and Patient being monitored Patient Re-evaluated:Patient Re-evaluated prior to induction Oxygen Delivery Method: Circle system utilized Preoxygenation: Pre-oxygenation with 100% oxygen Induction Type: IV induction Ventilation: Mask ventilation without difficulty Laryngoscope Size: Mac and 3 Grade View: Grade I Tube type: Oral Tube size: 8.0 mm Number of attempts: 1 Airway Equipment and Method: Stylet and Oral airway Placement Confirmation: ETT inserted through vocal cords under direct vision,  positive ETCO2 and breath sounds checked- equal and bilateral Secured at: 22 cm Tube secured with: Tape Dental Injury: Teeth and Oropharynx as per pre-operative assessment

## 2017-10-22 NOTE — Transfer of Care (Signed)
Immediate Anesthesia Transfer of Care Note  Patient: Roberto Rosales  Procedure(s) Performed: NASAL ENDOSCOPY WITH RIGHT EPISTAXIS CONTROL (Right Nose)  Patient Location: PACU  Anesthesia Type:General  Level of Consciousness: awake  Airway & Oxygen Therapy: Patient Spontanous Breathing and Patient connected to face mask oxygen  Post-op Assessment: Report given to RN and Post -op Vital signs reviewed and stable  Post vital signs: Reviewed and stable  Last Vitals:  Vitals Value Taken Time  BP    Temp    Pulse 55 10/22/2017 12:18 PM  Resp    SpO2 100 % 10/22/2017 12:18 PM  Vitals shown include unvalidated device data.  Last Pain:  Vitals:   10/22/17 1032  TempSrc: Oral  PainSc: 5       Patients Stated Pain Goal: 2 (72/07/21 8288)  Complications: No apparent anesthesia complications

## 2017-10-22 NOTE — Anesthesia Preprocedure Evaluation (Addendum)
Anesthesia Evaluation  Patient identified by MRN, date of birth, ID band Patient awake    Reviewed: Allergy & Precautions, NPO status , Patient's Chart, lab work & pertinent test results  History of Anesthesia Complications (+) PROLONGED EMERGENCE  Airway Mallampati: I  TM Distance: >3 FB Neck ROM: Full    Dental  (+) Teeth Intact   Pulmonary neg pulmonary ROS,    Pulmonary exam normal breath sounds clear to auscultation       Cardiovascular negative cardio ROS Normal cardiovascular exam Rhythm:Regular Rate:Normal     Neuro/Psych negative neurological ROS  negative psych ROS   GI/Hepatic negative GI ROS, Neg liver ROS,   Endo/Other  negative endocrine ROS  Renal/GU negative Renal ROS     Musculoskeletal negative musculoskeletal ROS (+)   Abdominal   Peds  Hematology negative hematology ROS (+)   Anesthesia Other Findings   Reproductive/Obstetrics                             Anesthesia Physical Anesthesia Plan  ASA: II  Anesthesia Plan: General   Post-op Pain Management:    Induction: Intravenous  PONV Risk Score and Plan: 2 and Ondansetron and Dexamethasone  Airway Management Planned: Oral ETT  Additional Equipment:   Intra-op Plan:   Post-operative Plan: Extubation in OR  Informed Consent: I have reviewed the patients History and Physical, chart, labs and discussed the procedure including the risks, benefits and alternatives for the proposed anesthesia with the patient or authorized representative who has indicated his/her understanding and acceptance.   Dental advisory given  Plan Discussed with: CRNA  Anesthesia Plan Comments:        Anesthesia Quick Evaluation                                  Anesthesia Evaluation  Patient identified by MRN, date of birth, ID band Patient awake    Reviewed: Allergy & Precautions, H&P , NPO status , Patient's Chart, lab  work & pertinent test results  Airway Mallampati: II  TM Distance: >3 FB Neck ROM: Full    Dental no notable dental hx. (+) Teeth Intact, Dental Advisory Given   Pulmonary neg pulmonary ROS,    Pulmonary exam normal breath sounds clear to auscultation       Cardiovascular negative cardio ROS   Rhythm:Regular Rate:Normal     Neuro/Psych negative neurological ROS  negative psych ROS   GI/Hepatic negative GI ROS, Neg liver ROS,   Endo/Other  negative endocrine ROS  Renal/GU negative Renal ROS  negative genitourinary   Musculoskeletal   Abdominal   Peds  Hematology negative hematology ROS (+)   Anesthesia Other Findings   Reproductive/Obstetrics negative OB ROS                             Anesthesia Physical Anesthesia Plan  ASA: II  Anesthesia Plan: General   Post-op Pain Management:    Induction: Intravenous, Rapid sequence and Cricoid pressure planned  PONV Risk Score and Plan: 3 and Ondansetron, Dexamethasone and Treatment may vary due to age or medical condition  Airway Management Planned: Oral ETT  Additional Equipment:   Intra-op Plan:   Post-operative Plan: Extubation in OR  Informed Consent: I have reviewed the patients History and Physical, chart, labs and discussed the procedure including the risks,  benefits and alternatives for the proposed anesthesia with the patient or authorized representative who has indicated his/her understanding and acceptance.   Dental advisory given  Plan Discussed with: CRNA  Anesthesia Plan Comments:         Anesthesia Quick Evaluation                                   Anesthesia Evaluation  Patient identified by MRN, date of birth, ID band Patient awake    Reviewed: Allergy & Precautions, H&P , NPO status , Patient's Chart, lab work & pertinent test results  Airway Mallampati: II  TM Distance: >3 FB Neck ROM: Full    Dental no notable dental hx. (+)  Teeth Intact, Dental Advisory Given   Pulmonary neg pulmonary ROS,    Pulmonary exam normal breath sounds clear to auscultation       Cardiovascular negative cardio ROS   Rhythm:Regular Rate:Normal     Neuro/Psych negative neurological ROS  negative psych ROS   GI/Hepatic negative GI ROS, Neg liver ROS,   Endo/Other  negative endocrine ROS  Renal/GU negative Renal ROS  negative genitourinary   Musculoskeletal   Abdominal   Peds  Hematology negative hematology ROS (+)   Anesthesia Other Findings   Reproductive/Obstetrics negative OB ROS                             Anesthesia Physical Anesthesia Plan  ASA: II  Anesthesia Plan: General   Post-op Pain Management:    Induction: Intravenous, Rapid sequence and Cricoid pressure planned  PONV Risk Score and Plan: 3 and Ondansetron, Dexamethasone and Treatment may vary due to age or medical condition  Airway Management Planned: Oral ETT  Additional Equipment:   Intra-op Plan:   Post-operative Plan: Extubation in OR  Informed Consent: I have reviewed the patients History and Physical, chart, labs and discussed the procedure including the risks, benefits and alternatives for the proposed anesthesia with the patient or authorized representative who has indicated his/her understanding and acceptance.   Dental advisory given  Plan Discussed with: CRNA  Anesthesia Plan Comments:         Anesthesia Quick Evaluation

## 2017-10-23 ENCOUNTER — Encounter (HOSPITAL_BASED_OUTPATIENT_CLINIC_OR_DEPARTMENT_OTHER): Payer: Self-pay | Admitting: Otolaryngology

## 2017-11-05 ENCOUNTER — Ambulatory Visit (INDEPENDENT_AMBULATORY_CARE_PROVIDER_SITE_OTHER): Payer: PPO | Admitting: Otolaryngology

## 2017-11-05 DIAGNOSIS — R040 Epistaxis: Secondary | ICD-10-CM | POA: Diagnosis not present

## 2017-11-09 DIAGNOSIS — D649 Anemia, unspecified: Secondary | ICD-10-CM | POA: Diagnosis not present

## 2017-11-09 DIAGNOSIS — E7800 Pure hypercholesterolemia, unspecified: Secondary | ICD-10-CM | POA: Diagnosis not present

## 2017-11-09 DIAGNOSIS — I10 Essential (primary) hypertension: Secondary | ICD-10-CM | POA: Diagnosis not present

## 2017-11-09 DIAGNOSIS — R739 Hyperglycemia, unspecified: Secondary | ICD-10-CM | POA: Diagnosis not present

## 2017-11-19 DIAGNOSIS — Z1389 Encounter for screening for other disorder: Secondary | ICD-10-CM | POA: Diagnosis not present

## 2017-11-19 DIAGNOSIS — Z6826 Body mass index (BMI) 26.0-26.9, adult: Secondary | ICD-10-CM | POA: Diagnosis not present

## 2017-11-19 DIAGNOSIS — E7800 Pure hypercholesterolemia, unspecified: Secondary | ICD-10-CM | POA: Diagnosis not present

## 2017-11-19 DIAGNOSIS — R42 Dizziness and giddiness: Secondary | ICD-10-CM | POA: Diagnosis not present

## 2017-11-19 DIAGNOSIS — R260 Ataxic gait: Secondary | ICD-10-CM | POA: Diagnosis not present

## 2017-11-19 DIAGNOSIS — R739 Hyperglycemia, unspecified: Secondary | ICD-10-CM | POA: Diagnosis not present

## 2017-11-19 DIAGNOSIS — Z1331 Encounter for screening for depression: Secondary | ICD-10-CM | POA: Diagnosis not present

## 2017-11-19 DIAGNOSIS — G629 Polyneuropathy, unspecified: Secondary | ICD-10-CM | POA: Diagnosis not present

## 2017-11-19 DIAGNOSIS — G5602 Carpal tunnel syndrome, left upper limb: Secondary | ICD-10-CM | POA: Diagnosis not present

## 2018-02-07 DIAGNOSIS — H524 Presbyopia: Secondary | ICD-10-CM | POA: Diagnosis not present

## 2018-02-07 DIAGNOSIS — Z961 Presence of intraocular lens: Secondary | ICD-10-CM | POA: Diagnosis not present

## 2018-02-07 DIAGNOSIS — H401131 Primary open-angle glaucoma, bilateral, mild stage: Secondary | ICD-10-CM | POA: Diagnosis not present

## 2018-02-18 ENCOUNTER — Ambulatory Visit: Payer: PPO | Admitting: Podiatry

## 2018-05-20 DIAGNOSIS — E7800 Pure hypercholesterolemia, unspecified: Secondary | ICD-10-CM | POA: Diagnosis not present

## 2018-05-20 DIAGNOSIS — Z0000 Encounter for general adult medical examination without abnormal findings: Secondary | ICD-10-CM | POA: Diagnosis not present

## 2018-05-20 DIAGNOSIS — R739 Hyperglycemia, unspecified: Secondary | ICD-10-CM | POA: Diagnosis not present

## 2018-05-20 DIAGNOSIS — I10 Essential (primary) hypertension: Secondary | ICD-10-CM | POA: Diagnosis not present

## 2018-05-20 DIAGNOSIS — Z6826 Body mass index (BMI) 26.0-26.9, adult: Secondary | ICD-10-CM | POA: Diagnosis not present

## 2018-05-22 DIAGNOSIS — E7800 Pure hypercholesterolemia, unspecified: Secondary | ICD-10-CM | POA: Diagnosis not present

## 2018-05-22 DIAGNOSIS — H4053X1 Glaucoma secondary to other eye disorders, bilateral, mild stage: Secondary | ICD-10-CM | POA: Diagnosis not present

## 2018-05-22 DIAGNOSIS — M7710 Lateral epicondylitis, unspecified elbow: Secondary | ICD-10-CM | POA: Diagnosis not present

## 2018-05-22 DIAGNOSIS — R739 Hyperglycemia, unspecified: Secondary | ICD-10-CM | POA: Diagnosis not present

## 2018-05-22 DIAGNOSIS — G629 Polyneuropathy, unspecified: Secondary | ICD-10-CM | POA: Diagnosis not present

## 2018-05-22 DIAGNOSIS — R260 Ataxic gait: Secondary | ICD-10-CM | POA: Diagnosis not present

## 2018-05-22 DIAGNOSIS — M545 Low back pain: Secondary | ICD-10-CM | POA: Diagnosis not present

## 2018-05-22 DIAGNOSIS — Z6826 Body mass index (BMI) 26.0-26.9, adult: Secondary | ICD-10-CM | POA: Diagnosis not present

## 2018-06-17 DIAGNOSIS — H401131 Primary open-angle glaucoma, bilateral, mild stage: Secondary | ICD-10-CM | POA: Diagnosis not present

## 2018-09-30 DIAGNOSIS — Z6826 Body mass index (BMI) 26.0-26.9, adult: Secondary | ICD-10-CM | POA: Diagnosis not present

## 2018-09-30 DIAGNOSIS — S80862A Insect bite (nonvenomous), left lower leg, initial encounter: Secondary | ICD-10-CM | POA: Diagnosis not present

## 2018-10-01 DIAGNOSIS — M25561 Pain in right knee: Secondary | ICD-10-CM | POA: Diagnosis not present

## 2018-10-01 DIAGNOSIS — M25571 Pain in right ankle and joints of right foot: Secondary | ICD-10-CM | POA: Diagnosis not present

## 2018-10-01 DIAGNOSIS — M25512 Pain in left shoulder: Secondary | ICD-10-CM | POA: Diagnosis not present

## 2018-10-16 DIAGNOSIS — S40012D Contusion of left shoulder, subsequent encounter: Secondary | ICD-10-CM | POA: Diagnosis not present

## 2018-10-16 DIAGNOSIS — M25571 Pain in right ankle and joints of right foot: Secondary | ICD-10-CM | POA: Diagnosis not present

## 2018-10-16 DIAGNOSIS — S8001XD Contusion of right knee, subsequent encounter: Secondary | ICD-10-CM | POA: Diagnosis not present

## 2018-10-17 DIAGNOSIS — H401131 Primary open-angle glaucoma, bilateral, mild stage: Secondary | ICD-10-CM | POA: Diagnosis not present

## 2018-11-18 DIAGNOSIS — R739 Hyperglycemia, unspecified: Secondary | ICD-10-CM | POA: Diagnosis not present

## 2018-11-18 DIAGNOSIS — I10 Essential (primary) hypertension: Secondary | ICD-10-CM | POA: Diagnosis not present

## 2018-11-18 DIAGNOSIS — E7800 Pure hypercholesterolemia, unspecified: Secondary | ICD-10-CM | POA: Diagnosis not present

## 2018-11-21 DIAGNOSIS — Z1212 Encounter for screening for malignant neoplasm of rectum: Secondary | ICD-10-CM | POA: Diagnosis not present

## 2018-11-21 DIAGNOSIS — R739 Hyperglycemia, unspecified: Secondary | ICD-10-CM | POA: Diagnosis not present

## 2018-11-21 DIAGNOSIS — M545 Low back pain: Secondary | ICD-10-CM | POA: Diagnosis not present

## 2018-11-21 DIAGNOSIS — G629 Polyneuropathy, unspecified: Secondary | ICD-10-CM | POA: Diagnosis not present

## 2018-11-21 DIAGNOSIS — H4053X1 Glaucoma secondary to other eye disorders, bilateral, mild stage: Secondary | ICD-10-CM | POA: Diagnosis not present

## 2018-11-21 DIAGNOSIS — R260 Ataxic gait: Secondary | ICD-10-CM | POA: Diagnosis not present

## 2018-11-21 DIAGNOSIS — E7800 Pure hypercholesterolemia, unspecified: Secondary | ICD-10-CM | POA: Diagnosis not present

## 2018-11-21 DIAGNOSIS — Z6827 Body mass index (BMI) 27.0-27.9, adult: Secondary | ICD-10-CM | POA: Diagnosis not present

## 2018-11-25 ENCOUNTER — Other Ambulatory Visit: Payer: Self-pay

## 2018-11-25 ENCOUNTER — Ambulatory Visit (INDEPENDENT_AMBULATORY_CARE_PROVIDER_SITE_OTHER): Payer: PPO | Admitting: Otolaryngology

## 2018-11-25 DIAGNOSIS — R040 Epistaxis: Secondary | ICD-10-CM

## 2018-11-25 LAB — IFOBT (OCCULT BLOOD): IFOBT: POSITIVE

## 2018-11-27 ENCOUNTER — Encounter (HOSPITAL_COMMUNITY): Payer: Self-pay

## 2018-11-27 ENCOUNTER — Emergency Department (HOSPITAL_COMMUNITY)
Admission: EM | Admit: 2018-11-27 | Discharge: 2018-11-27 | Disposition: A | Discharge status: Home / Self Care | Payer: PPO | Attending: Emergency Medicine | Admitting: Emergency Medicine

## 2018-11-27 ENCOUNTER — Other Ambulatory Visit: Payer: Self-pay

## 2018-11-27 DIAGNOSIS — R040 Epistaxis: Secondary | ICD-10-CM | POA: Diagnosis not present

## 2018-11-27 DIAGNOSIS — Z79899 Other long term (current) drug therapy: Secondary | ICD-10-CM | POA: Insufficient documentation

## 2018-11-27 DIAGNOSIS — I10 Essential (primary) hypertension: Secondary | ICD-10-CM | POA: Diagnosis not present

## 2018-11-27 LAB — CBC
HCT: 41.4 % (ref 39.0–52.0)
Hemoglobin: 13.8 g/dL (ref 13.0–17.0)
MCH: 28.5 pg (ref 26.0–34.0)
MCHC: 33.3 g/dL (ref 30.0–36.0)
MCV: 85.5 fL (ref 80.0–100.0)
Platelets: 187 K/uL (ref 150–400)
RBC: 4.84 MIL/uL (ref 4.22–5.81)
RDW: 13.5 % (ref 11.5–15.5)
WBC: 6.9 K/uL (ref 4.0–10.5)
nRBC: 0 % (ref 0.0–0.2)

## 2018-11-27 MED ORDER — SILVER NITRATE-POT NITRATE 75-25 % EX MISC
2.0000 | Freq: Once | CUTANEOUS | Status: AC
  Administered 2018-11-27: 2 via TOPICAL
  Filled 2018-11-27: qty 1

## 2018-11-27 MED ORDER — TRANEXAMIC ACID 1000 MG/10ML IV SOLN
500.0000 mg | Freq: Once | INTRAVENOUS | Status: AC
  Administered 2018-11-27: 500 mg via TOPICAL
  Filled 2018-11-27: qty 10

## 2018-11-27 NOTE — Discharge Instructions (Signed)
Use the afrin 2 times a day for the next 3 days.

## 2018-11-27 NOTE — ED Notes (Signed)
IV wrapped in Kerlix wrap.

## 2018-11-27 NOTE — ED Notes (Addendum)
Pt here from [facility] transfer to see Dr. Maryan Rued

## 2018-11-27 NOTE — ED Provider Notes (Signed)
Patient transferred from [facility] today due to persistent nosebleed that did not improve with packing.  Patient came by private vehicle takes no anticoagulation and states it is pretty much stopped upon arrival here.  He is having no posterior pharyngeal bleeding at this time.  Patient has abnormal nasal anatomy with severe deviated septum and possibly a hole in the septum.  There is no bleeding at this time.  Patient given Afrin and after an hour of observation he has had no further bleeding.  He did have cauterization 2 days ago by Dr. Benjamine Mola.   [name], MD 11/27/18 [phone]

## 2018-11-27 NOTE — ED Provider Notes (Signed)
[facility] EMERGENCY DEPARTMENT Provider Note   CSN: [id number] Arrival date & time: 11/27/18  1714    History   Chief Complaint Chief Complaint  Patient presents with  . Epistaxis    HPI Roberto Rosales is a 73 y.o. male with a PMH of recurrent epistaxis, hyperlipidemia, and glaucoma who presents with left sided epistaxis.  He says that he has had a small amount of intermittent epistaxis for the last 2 weeks, but as he was sitting watching TV this afternoon, the left nare began gushing blood.  His right nare is also bleeding but less than the left.  He saw his ENT physician, Dr. Benjamine Mola, on Monday, July 20, and the left nare was cauterized.  He has needed multiple cauterizations of both sides of his nose during the past year and has been told that his nose is so crooked that the veins in his nose are larger than normal.  He also says that surgery has been required in the past to remove packing placed during an episode of epistaxis.  The patient denies lightheadedness, chest pain, cough, and shortness of breath and says that he overall feels well.    Past Medical History:  Diagnosis Date  . Epistaxis   . Glaucoma   . High cholesterol   . Neuropathy     Patient Active Problem List   Diagnosis Date Noted  . Enteritis due to Norovirus 08/03/2017  . Hypokalemia 08/03/2017  . Dehydration   . Acute gastroenteritis 08/02/2017  . Hyperlipemia 08/02/2017  . Glaucoma 08/02/2017    Past Surgical History:  Procedure Laterality Date  . BACK SURGERY    . CATARACT EXTRACTION Bilateral 2007  . NASAL ENDOSCOPY WITH EPISTAXIS CONTROL Right 10/11/2017   Procedure: NASAL CAUTERY AND  BIOPSY OF RIGHT NASAL GRANULATION TISSUE;  Surgeon: [name], MD;  Location: [place];  Service: ENT;  Laterality: Right;  . NASAL ENDOSCOPY WITH EPISTAXIS CONTROL Right 10/22/2017   Procedure: NASAL ENDOSCOPY WITH RIGHT EPISTAXIS CONTROL;  Surgeon: [name], MD;  Location: [place];  Service: ENT;   Laterality: Right;  . TONSILLECTOMY  1950        Home Medications    Prior to Admission medications   Medication Sig Start Date End Date Taking? Authorizing Provider  atorvastatin (LIPITOR) 10 MG tablet Take 5 mg by mouth.  08/16/16   [provider]  dorzolamide-timolol (COSOPT) 22.3-6.8 MG/ML ophthalmic solution Place 1 drop into the left eye 2 (two) times daily.    [provider]  latanoprost (XALATAN) 0.005 % ophthalmic solution Place 1 drop into both eyes at bedtime.  07/18/16   [provider]  Multiple Vitamin (MULTIVITAMIN) tablet Take 1 tablet by mouth daily.    [provider]  oxyCODONE-acetaminophen (PERCOCET/ROXICET) 5-325 MG tablet Take 1 tablet by mouth every 4 (four) hours as needed for severe pain. 10/11/17   [name], MD    Family History Family History  Problem Relation Age of Onset  . Cerebral aneurysm Mother   . Heart disease Father   . Stroke Father   . Neuropathy Neg Hx     Social History Social History   Tobacco Use  . Smoking status: Never Smoker  . Smokeless tobacco: Never Used  Substance Use Topics  . Alcohol use: No  . Drug use: No     Allergies   Codeine   Review of Systems Review of Systems  Constitutional: Negative for activity change, appetite change and fever.  HENT: Positive for nosebleeds. Negative for congestion and rhinorrhea.   Respiratory: Negative for cough and shortness of breath.   Cardiovascular: Negative for chest pain.  Gastrointestinal: Negative for abdominal pain.  Genitourinary: Negative for difficulty urinating.  Musculoskeletal: Negative for arthralgias.  Neurological: Negative for headaches.  Psychiatric/Behavioral: Negative for confusion.     Physical Exam Updated Vital Signs BP (!) 157/98 (BP Location: Right Arm)   Pulse 64   Temp (!) 97.4 F (36.3 C) (Oral)   Resp 12   SpO2 97%   Physical Exam Constitutional:      General: He is not in acute distress.     Appearance: Normal appearance.  HENT:     Head: Normocephalic and atraumatic.     Right Ear: External ear normal.     Left Ear: External ear normal.     Nose:     Comments: No discrete area of bleeding visualized, but possible inflamed area seen on left septum, ongoing slow bleeding from the left nare    Mouth/Throat:     Mouth: Mucous membranes are moist.  Eyes:     Extraocular Movements: Extraocular movements intact.     Conjunctiva/sclera: Conjunctivae normal.     Pupils: Pupils are equal, round, and reactive to light.  Neck:     Musculoskeletal: Normal range of motion.  Cardiovascular:     Rate and Rhythm: Normal rate and regular rhythm.     Pulses: Normal pulses.  Pulmonary:     Effort: Pulmonary effort is normal.     Breath sounds: Normal breath sounds.  Abdominal:     General: Abdomen is flat.     Palpations: Abdomen is soft.  Musculoskeletal: Normal range of motion.  Skin:    General: Skin is warm and dry.  Neurological:     General: No focal deficit present.     Mental Status: He is alert and oriented to person, place, and time.  Psychiatric:        Mood and Affect: Mood normal.        Behavior: Behavior normal.      ED Treatments / Results  Labs (all labs ordered are listed, but only abnormal results are displayed) Labs Reviewed  CBC    EKG None  Radiology No results found.  Procedures Procedures (including critical care time)  Medications Ordered in ED Medications  silver nitrate applicators applicator 2 Stick (has no administration in time range)  tranexamic acid (CYKLOKAPRON) injection 500 mg (has no administration in time range)     Initial Impression / Assessment and Plan / ED Course  I have reviewed the triage vital signs and the nursing notes.  Pertinent labs & imaging results that were available during my care of the patient were reviewed by me and considered in my medical decision making (see chart for details).       Since no  discrete area of bleeding is visualized, will treat with gauze soaked in tranexamic acid.  Will allow medication to be in contact with inside of nare for 20-30 and reassess.  Hemoglobin is wnl.  Patient continued to have some oozing after TXA administration, so silver nitrate to the left septum from which bleeding was expected.  Dr. Benjamine Mola was consulted and recommended placement of packing if any bleeding continued after TXA and silver nitrate.  He said he will see him in his office on Monday, July 27 if packing was placed.  Since some bleeding continued after these measures, a rhino rocket was placed  in the left nare.  Due to patient's anatomy, the rhino rocket could only be placed 3.5 cm into the nare.  Patient was observed for about 20 minutes to see if bleeding would subside.  Patient continues to bleed through the rhino rocket and is bleeding from the right nare as well.  Dr. Benjamine Mola was consulted again, and he recommended that the patient be transferred to [facility] Emergency Department so that he can have Merocel placed after it has been sprayed with Afrin.  The patient's wife has agreed to pick him up and take him to [facility] ED, and the ED provider was notified of his transfer.  Final Clinical Impressions(s) / ED Diagnoses   Final diagnoses:  Epistaxis, recurrent    ED Discharge Orders    None       Kathrene Alu, MD 11/27/18 2023    Margette Fast, MD 11/28/18 [phone]

## 2018-11-27 NOTE — ED Triage Notes (Signed)
Pt has a history of nose bleeds. Had a cauterization last week on the left nare. Lost a lot of blood today. Was given 2 sprays of Afrin each nostril by EMS with no relief.

## 2019-01-20 DIAGNOSIS — Z23 Encounter for immunization: Secondary | ICD-10-CM | POA: Diagnosis not present

## 2019-01-20 DIAGNOSIS — Z6825 Body mass index (BMI) 25.0-25.9, adult: Secondary | ICD-10-CM | POA: Diagnosis not present

## 2019-01-20 DIAGNOSIS — R110 Nausea: Secondary | ICD-10-CM | POA: Diagnosis not present

## 2019-01-22 ENCOUNTER — Ambulatory Visit: Payer: PPO

## 2019-01-23 ENCOUNTER — Other Ambulatory Visit: Payer: Self-pay

## 2019-01-23 ENCOUNTER — Ambulatory Visit (INDEPENDENT_AMBULATORY_CARE_PROVIDER_SITE_OTHER): Payer: Self-pay | Admitting: Gastroenterology

## 2019-01-23 DIAGNOSIS — Z1211 Encounter for screening for malignant neoplasm of colon: Secondary | ICD-10-CM

## 2019-01-23 NOTE — Progress Notes (Signed)
Gastroenterology Pre-Procedure Review  Request Date: 01/23/2019 Requesting Physician: Dr. Consuello Masse @ [facility], Last TCS 12 yrs ago done by Dr. [name], no polyps per pt  PATIENT REVIEW QUESTIONS: The patient responded to the following health history questions as indicated:    1. Diabetes Melitis: No 2. Joint replacements in the past 12 months: No 3. Major health problems in the past 3 months: Yes, nose surgery  4. Has an artificial valve or MVP: No 5. Has a defibrillator: No 6. Has been advised in past to take antibiotics in advance of a procedure like teeth cleaning: No 7. Family history of colon cancer: No 8. Alcohol Use: No 9. History of sleep apnea: No 10. History of coronary artery or other vascular stents placed within the last 12 months: No 11. History of any prior anesthesia complications: No    MEDICATIONS & ALLERGIES:    Patient reports the following regarding taking any blood thinners:   Plavix? No Aspirin? No Coumadin? No Brilinta? No Xarelto? No Eliquis? No Pradaxa? No Savaysa? No Effient? No  Patient confirms/reports the following medications:  Current Outpatient Medications  Medication Sig Dispense Refill  . atorvastatin (LIPITOR) 10 MG tablet Take 5 mg by mouth daily.     . dorzolamide-timolol (COSOPT) 22.3-6.8 MG/ML ophthalmic solution Place 1 drop into the left eye 2 (two) times daily.    Marland Kitchen latanoprost (XALATAN) 0.005 % ophthalmic solution Place 1 drop into both eyes at bedtime.     . Multiple Vitamin (MULTIVITAMIN) tablet Take 1 tablet by mouth daily.     No current facility-administered medications for this visit.     Patient confirms/reports the following allergies:  Allergies  Allergen Reactions  . Codeine Nausea Only    No orders of the defined types were placed in this encounter.   AUTHORIZATION INFORMATION Primary Insurance: [employer],  ID #: [id number] Pre-Cert / Josem Kaufmann required:  Pre-Cert / Josem Kaufmann #:   SCHEDULE  INFORMATION: Procedure has been scheduled as follows:  Date: , Time:  Location:  This Gastroenterology Pre-Precedure Review Form is being routed to the following provider(s): Neil Crouch, PA-C

## 2019-01-23 NOTE — Progress Notes (Signed)
Pt informed me that he did not want to do a COVID screening since he has a history of multiple nose surgeries.  Called [facility] to see if there was any other type of screening that we could do.  Informed pt that there wasn't so he requested to cancel scheduling the colonoscopy.

## 2019-03-04 DIAGNOSIS — H5202 Hypermetropia, left eye: Secondary | ICD-10-CM | POA: Diagnosis not present

## 2019-03-04 DIAGNOSIS — H524 Presbyopia: Secondary | ICD-10-CM | POA: Diagnosis not present

## 2019-03-04 DIAGNOSIS — Z961 Presence of intraocular lens: Secondary | ICD-10-CM | POA: Diagnosis not present

## 2019-03-04 DIAGNOSIS — H401131 Primary open-angle glaucoma, bilateral, mild stage: Secondary | ICD-10-CM | POA: Diagnosis not present

## 2019-03-04 DIAGNOSIS — H52203 Unspecified astigmatism, bilateral: Secondary | ICD-10-CM | POA: Diagnosis not present

## 2019-03-11 DIAGNOSIS — Z6825 Body mass index (BMI) 25.0-25.9, adult: Secondary | ICD-10-CM | POA: Diagnosis not present

## 2019-03-11 DIAGNOSIS — L03039 Cellulitis of unspecified toe: Secondary | ICD-10-CM | POA: Diagnosis not present

## 2019-03-11 DIAGNOSIS — B351 Tinea unguium: Secondary | ICD-10-CM | POA: Diagnosis not present

## 2019-04-07 ENCOUNTER — Ambulatory Visit: Payer: PPO

## 2019-04-07 ENCOUNTER — Encounter: Payer: Self-pay | Admitting: Orthopedic Surgery

## 2019-04-07 ENCOUNTER — Ambulatory Visit: Payer: PPO | Admitting: Orthopedic Surgery

## 2019-04-07 ENCOUNTER — Other Ambulatory Visit: Payer: Self-pay

## 2019-04-07 VITALS — BP 150/81 | HR 60 | Temp 96.6°F | Ht 73.0 in | Wt 189.0 lb

## 2019-04-07 DIAGNOSIS — M25511 Pain in right shoulder: Secondary | ICD-10-CM

## 2019-04-07 DIAGNOSIS — M7551 Bursitis of right shoulder: Secondary | ICD-10-CM | POA: Diagnosis not present

## 2019-04-07 NOTE — Progress Notes (Signed)
Roberto Rosales  04/07/2019  Body mass index is 24.94 kg/m.   HISTORY SECTION :  Chief Complaint  Patient presents with  . Shoulder Pain    Right shoulder pain   HPI The patient presents for evaluation of  (mild/moderate/severe/ ) MILD pain, in the (right /left) RIGHT SHOULDER  , for < 4 WEEKS  , associated with SUBTLE ROM LOSS .  Prior treatment NONE   NO INJURIES, head frozen shoulder on the left tender for several years ago says it feels similar   Review of Systems  HENT: Positive for nosebleeds.   Eyes: Positive for blurred vision.  Cardiovascular: Positive for orthopnea.  Musculoskeletal: Positive for back pain.  Psychiatric/Behavioral: Positive for depression.  All other systems reviewed and are negative.    has a past medical history of Epistaxis, Glaucoma, High cholesterol, and Neuropathy.   Past Surgical History:  Procedure Laterality Date  . BACK SURGERY    . CATARACT EXTRACTION Bilateral 2007  . NASAL ENDOSCOPY WITH EPISTAXIS CONTROL Right 10/11/2017   Procedure: NASAL CAUTERY AND  BIOPSY OF RIGHT NASAL GRANULATION TISSUE;  Surgeon: [name], MD;  Location: [place];  Service: ENT;  Laterality: Right;  . NASAL ENDOSCOPY WITH EPISTAXIS CONTROL Right 10/22/2017   Procedure: NASAL ENDOSCOPY WITH RIGHT EPISTAXIS CONTROL;  Surgeon: [name], MD;  Location: [place];  Service: ENT;  Laterality: Right;  . TONSILLECTOMY  1950    Body mass index is 24.94 kg/m.   Allergies  Allergen Reactions  . Codeine Nausea Only     Current Outpatient Medications:  .  atorvastatin (LIPITOR) 10 MG tablet, Take 5 mg by mouth daily. , Disp: , Rfl:  .  dorzolamide-timolol (COSOPT) 22.3-6.8 MG/ML ophthalmic solution, Place 1 drop into the left eye 2 (two) times daily., Disp: , Rfl:  .  latanoprost (XALATAN) 0.005 % ophthalmic solution, Place 1 drop into both eyes at bedtime. , Disp: , Rfl:  .  Multiple Vitamin (MULTIVITAMIN) tablet, Take 1 tablet by mouth daily.,  Disp: , Rfl:    PHYSICAL EXAM SECTION: 1) BP (!) 150/81   Pulse 60   Temp (!) 96.6 F (35.9 C)   Ht 6\' 1"  (1.854 m)   Wt 189 lb (85.7 kg)   BMI 24.94 kg/m   Body mass index is 24.94 kg/m. General appearance: Well-developed well-nourished no gross deformities  2) Cardiovascular normal pulse and perfusion in the RT/LT UPPER  extremities normal color without edema  3) Neurologically deep tendon reflexes are equal and normal, no sensation loss or deficits no pathologic reflexes  4) Psychological: Awake alert and oriented x3 mood and affect normal  5) Skin no lacerations or ulcerations no nodularity no palpable masses, no erythema or nodularity  6) Musculoskeletal:  RIGHT SHOULDER  Decreased internal rotation tenderness in the subacromial space normal internal and external rotation normal abduction painful flexion after 120 degrees positive Neer sign for impingement skin normal  LEFT SHOULDER  Skin is normal no tenderness, no range of motion.  Normal strength.  No instability   MEDICAL DECISION SECTION:  Encounter Diagnoses  Name Primary?  . Right shoulder pain, unspecified chronicity   . Bursitis of right shoulder Yes    Imaging IN THE OFFICE: Mild proximal migration of the humerus with no glenohumeral arthritis.  See report  Plan:  (Rx., Inj., surg., Frx, MRI/CT, XR:2)  Injection subacromial space   Procedure note the subacromial injection shoulder RIGHT    Verbal consent was obtained to inject  the  RIGHT   Shoulder  Timeout was completed to confirm the injection site is a subacromial space of the  RIGHT  shoulder   Medication used Depo-Medrol 40 mg and lidocaine 1% 3 cc  Anesthesia was provided by ethyl chloride  The injection was performed in the RIGHT  posterior subacromial space. After pinning the skin with alcohol and anesthetized the skin with ethyl chloride the subacromial space was injected using a 20-gauge needle. There were no complications  Sterile  dressing was applied.    3:34 PM Arther Abbott, MD  04/07/2019

## 2019-04-07 NOTE — Patient Instructions (Addendum)
These are the muscle and arthrits creams I recommend:  PLEASE READ THE PACKAGE INSTRUCTIONS BEFORE USING   Ben Gay arthritis cream  Icy hot vanishing gel  Aspercreme odor free  Myoflex Oderless pain reliever  Capzasin  Sportscreme  Max freeze Bursitis  Bursitis is when the fluid-filled sac (bursa) that covers and protects a joint is swollen (inflamed). Bursitis is most common near joints such as the knees, elbows, hips, and shoulders. It can cause pain and stiffness. Follow these instructions at home: Medicines  Take over-the-counter and prescription medicines only as told by your doctor.  If you were prescribed an antibiotic medicine, take it as told by your doctor. Do not stop taking it even if you start to feel better. General instructions   Rest the affected area as told by your doctor. ? If you can, raise (elevate) the affected area above the level of your heart while you are sitting or lying down. ? Avoid doing things that make the pain worse.  Use a splint, brace, pad, or walking aid as told by your doctor.  If directed, put ice on the affected area: ? If you have a removable splint or brace, take it off as told by your doctor. ? Put ice in a plastic bag. ? Place a towel between your skin and the bag, or between the splint or brace and the bag. ? Leave the ice on for 20 minutes, 2-3 times a day.  Keep all follow-up visits as told by your doctor. This is important. Preventing symptoms Do these things to help you not have symptoms again:  Wear knee pads if you kneel often.  Wear sturdy running or walking shoes that fit you well.  Take a lot of breaks during activities that involve doing the same movements again and again.  Before you do any activity that takes a lot of effort, get your body ready by stretching.  Stay at a healthy weight or lose weight if your doctor says you should. If you need help doing this, ask your doctor.  Exercise often. If you start  any new physical activity, do it slowly. Contact a doctor if you:  Have a fever.  Have chills.  Have symptoms that do not get better with treatment or home care. Summary  Bursitis is when the fluid-filled sac (bursa) that covers and protects a joint is swollen.  Rest the affected area as told by your doctor.  Avoid doing things that make the pain worse.  Put ice on the affected area as told by your doctor. This information is not intended to replace advice given to you by your health care provider. Make sure you discuss any questions you have with your health care provider. Document Released: 10/12/2009 Document Revised: 04/06/2017 Document Reviewed: 03/09/2017 Elsevier Patient Education  2020 [employer].

## 2019-04-10 ENCOUNTER — Ambulatory Visit: Payer: PPO | Admitting: Podiatry

## 2019-05-05 ENCOUNTER — Other Ambulatory Visit: Payer: Self-pay

## 2019-05-05 ENCOUNTER — Ambulatory Visit: Payer: PPO | Admitting: Orthopedic Surgery

## 2019-05-05 VITALS — Ht 73.0 in | Wt 190.0 lb

## 2019-05-05 DIAGNOSIS — M7551 Bursitis of right shoulder: Secondary | ICD-10-CM | POA: Diagnosis not present

## 2019-05-05 NOTE — Progress Notes (Signed)
Follow-up visit  73 year old male recently seen on November 30 for shoulder pain diagnosed with bursitis had an injection and said that his pain is slight but it did get better really until yesterday and he was still having trouble lifting his arm above his head but now he can.  He relates it to not exercising with shoulder machines at [employer]  Exam today shows that he has full strength in both rotator cuffs he has mild impingement in 125 degrees has full passive range of motion and active range of motion of 150 degrees  Impression improved bursitis right shoulder  Recommend continue rest for 1 week gradual return to normal activities follow-up as needed

## 2019-05-05 NOTE — Patient Instructions (Signed)
1 more week of rest

## 2019-05-10 DIAGNOSIS — Z6825 Body mass index (BMI) 25.0-25.9, adult: Secondary | ICD-10-CM | POA: Diagnosis not present

## 2019-05-10 DIAGNOSIS — I872 Venous insufficiency (chronic) (peripheral): Secondary | ICD-10-CM | POA: Diagnosis not present

## 2019-05-10 DIAGNOSIS — L299 Pruritus, unspecified: Secondary | ICD-10-CM | POA: Diagnosis not present

## 2019-05-22 DIAGNOSIS — E7800 Pure hypercholesterolemia, unspecified: Secondary | ICD-10-CM | POA: Diagnosis not present

## 2019-05-22 DIAGNOSIS — I10 Essential (primary) hypertension: Secondary | ICD-10-CM | POA: Diagnosis not present

## 2019-05-22 DIAGNOSIS — R739 Hyperglycemia, unspecified: Secondary | ICD-10-CM | POA: Diagnosis not present

## 2019-05-28 DIAGNOSIS — R260 Ataxic gait: Secondary | ICD-10-CM | POA: Diagnosis not present

## 2019-05-28 DIAGNOSIS — R739 Hyperglycemia, unspecified: Secondary | ICD-10-CM | POA: Diagnosis not present

## 2019-05-28 DIAGNOSIS — Z0001 Encounter for general adult medical examination with abnormal findings: Secondary | ICD-10-CM | POA: Diagnosis not present

## 2019-05-28 DIAGNOSIS — E7800 Pure hypercholesterolemia, unspecified: Secondary | ICD-10-CM | POA: Diagnosis not present

## 2019-05-28 DIAGNOSIS — H4053X1 Glaucoma secondary to other eye disorders, bilateral, mild stage: Secondary | ICD-10-CM | POA: Diagnosis not present

## 2019-05-28 DIAGNOSIS — M545 Low back pain: Secondary | ICD-10-CM | POA: Diagnosis not present

## 2019-05-28 DIAGNOSIS — Z6825 Body mass index (BMI) 25.0-25.9, adult: Secondary | ICD-10-CM | POA: Diagnosis not present

## 2019-05-28 DIAGNOSIS — G629 Polyneuropathy, unspecified: Secondary | ICD-10-CM | POA: Diagnosis not present

## 2019-07-07 DIAGNOSIS — H401131 Primary open-angle glaucoma, bilateral, mild stage: Secondary | ICD-10-CM | POA: Diagnosis not present

## 2019-07-26 DIAGNOSIS — J340 Abscess, furuncle and carbuncle of nose: Secondary | ICD-10-CM | POA: Diagnosis not present

## 2019-10-31 ENCOUNTER — Other Ambulatory Visit: Payer: Self-pay

## 2019-10-31 ENCOUNTER — Emergency Department (HOSPITAL_COMMUNITY): Payer: PPO

## 2019-10-31 ENCOUNTER — Observation Stay (HOSPITAL_COMMUNITY)
Admission: EM | Admit: 2019-10-31 | Discharge: 2019-11-01 | Disposition: A | Discharge status: Home / Self Care | Payer: PPO | Attending: Internal Medicine | Admitting: Internal Medicine

## 2019-10-31 DIAGNOSIS — K859 Acute pancreatitis without necrosis or infection, unspecified: Secondary | ICD-10-CM | POA: Diagnosis not present

## 2019-10-31 DIAGNOSIS — K869 Disease of pancreas, unspecified: Secondary | ICD-10-CM | POA: Diagnosis present

## 2019-10-31 DIAGNOSIS — R079 Chest pain, unspecified: Secondary | ICD-10-CM | POA: Insufficient documentation

## 2019-10-31 DIAGNOSIS — Z20822 Contact with and (suspected) exposure to covid-19: Secondary | ICD-10-CM | POA: Diagnosis not present

## 2019-10-31 DIAGNOSIS — Z885 Allergy status to narcotic agent status: Secondary | ICD-10-CM | POA: Insufficient documentation

## 2019-10-31 DIAGNOSIS — R748 Abnormal levels of other serum enzymes: Secondary | ICD-10-CM | POA: Diagnosis not present

## 2019-10-31 DIAGNOSIS — H409 Unspecified glaucoma: Secondary | ICD-10-CM | POA: Diagnosis not present

## 2019-10-31 DIAGNOSIS — M549 Dorsalgia, unspecified: Secondary | ICD-10-CM | POA: Diagnosis not present

## 2019-10-31 DIAGNOSIS — R1013 Epigastric pain: Principal | ICD-10-CM | POA: Diagnosis present

## 2019-10-31 DIAGNOSIS — K5730 Diverticulosis of large intestine without perforation or abscess without bleeding: Secondary | ICD-10-CM | POA: Insufficient documentation

## 2019-10-31 DIAGNOSIS — E782 Mixed hyperlipidemia: Secondary | ICD-10-CM | POA: Diagnosis not present

## 2019-10-31 DIAGNOSIS — R1011 Right upper quadrant pain: Secondary | ICD-10-CM | POA: Diagnosis not present

## 2019-10-31 DIAGNOSIS — I213 ST elevation (STEMI) myocardial infarction of unspecified site: Secondary | ICD-10-CM | POA: Diagnosis not present

## 2019-10-31 DIAGNOSIS — E7800 Pure hypercholesterolemia, unspecified: Secondary | ICD-10-CM | POA: Insufficient documentation

## 2019-10-31 DIAGNOSIS — Z79899 Other long term (current) drug therapy: Secondary | ICD-10-CM | POA: Insufficient documentation

## 2019-10-31 DIAGNOSIS — I10 Essential (primary) hypertension: Secondary | ICD-10-CM | POA: Diagnosis not present

## 2019-10-31 DIAGNOSIS — I499 Cardiac arrhythmia, unspecified: Secondary | ICD-10-CM | POA: Diagnosis not present

## 2019-10-31 DIAGNOSIS — R7401 Elevation of levels of liver transaminase levels: Secondary | ICD-10-CM

## 2019-10-31 DIAGNOSIS — M8580 Other specified disorders of bone density and structure, unspecified site: Secondary | ICD-10-CM | POA: Insufficient documentation

## 2019-10-31 DIAGNOSIS — R0789 Other chest pain: Secondary | ICD-10-CM | POA: Diagnosis not present

## 2019-10-31 LAB — BASIC METABOLIC PANEL
Anion gap: 7 (ref 5–15)
BUN: 18 mg/dL (ref 8–23)
CO2: 26 mmol/L (ref 22–32)
Calcium: 9 mg/dL (ref 8.9–10.3)
Chloride: 105 mmol/L (ref 98–111)
Creatinine, Ser: 1.01 mg/dL (ref 0.61–1.24)
GFR calc Af Amer: >60 mL/min (ref >60)
GFR calc non Af Amer: >60 mL/min (ref >60)
Glucose, Bld: 126 mg/dL — ABNORMAL HIGH (ref 70–99)
Potassium: 3.7 mmol/L (ref 3.5–5.1)
Sodium: 138 mmol/L (ref 135–145)

## 2019-10-31 LAB — HEPATIC FUNCTION PANEL
ALT: 63 U/L — ABNORMAL HIGH (ref 0–44)
AST: 103 U/L — ABNORMAL HIGH (ref 15–41)
Albumin: 3.7 g/dL (ref 3.5–5.0)
Alkaline Phosphatase: 79 U/L (ref 38–126)
Bilirubin, Direct: 0.1 mg/dL (ref 0.0–0.2)
Indirect Bilirubin: 0.6 mg/dL (ref 0.3–0.9)
Total Bilirubin: 0.7 mg/dL (ref 0.3–1.2)
Total Protein: 5.9 g/dL — ABNORMAL LOW (ref 6.5–8.1)

## 2019-10-31 LAB — CBC
HCT: 44.3 % (ref 39.0–52.0)
Hemoglobin: 14.9 g/dL (ref 13.0–17.0)
MCH: 29 pg (ref 26.0–34.0)
MCHC: 33.6 g/dL (ref 30.0–36.0)
MCV: 86.2 fL (ref 80.0–100.0)
Platelets: 173 10*3/uL (ref 150–400)
RBC: 5.14 MIL/uL (ref 4.22–5.81)
RDW: 13.2 % (ref 11.5–15.5)
WBC: 6.6 10*3/uL (ref 4.0–10.5)
nRBC: 0 % (ref 0.0–0.2)

## 2019-10-31 LAB — LIPASE, BLOOD: Lipase: 610 U/L — ABNORMAL HIGH (ref 11–51)

## 2019-10-31 LAB — TROPONIN I (HIGH SENSITIVITY)
Troponin I (High Sensitivity): 8 ng/L (ref <18)
Troponin I (High Sensitivity): 12 ng/L (ref <18)

## 2019-10-31 MED ORDER — SODIUM CHLORIDE 0.9% FLUSH
3.0000 mL | Freq: Once | INTRAVENOUS | Status: AC
  Administered 2019-11-01: 3 mL via INTRAVENOUS

## 2019-10-31 MED ORDER — IOHEXOL 350 MG/ML SOLN
100.0000 mL | Freq: Once | INTRAVENOUS | Status: AC | PRN
  Administered 2019-10-31: 100 mL via INTRAVENOUS

## 2019-10-31 NOTE — ED Triage Notes (Signed)
Pt arrived from home via [facility]. Pt started having chest pain around 1800 hour; 10 out of 10. Pain is intermittent and is NOT radiating., Pt took Viagra a day a go and no nitro given. EMS did give 250 mL  N/S and 324 asprin.

## 2019-10-31 NOTE — ED Provider Notes (Signed)
[facility]PART[facility] Provider Note   CSN: [id number] Arrival date & time: 10/31/19  1942     History Chief Complaint  Patient presents with  . Chest Pain    Roberto Rosales is a 74 y.o. male.  HPI Patient presents with chest pain.  Began around 6:00 today.  Sharp in the mid chest.  Somewhat waxes and wanes between a level 5 and a level of a 10.  States he saw a little off the last few days but no chest pain.  States he is just felt more fatigued.  No fevers or chills.  No cough.  No difficulty breathing.  Has not had pains like this before.  States that no numbness weakness.  No nausea or vomiting.  States that his younger and older sons both had pains like this in the past and it was their gallbladder.  States years ago, around 50 years ago, he had palpitations and reportedly had a stress test at that time.    Past Medical History:  Diagnosis Date  . Epistaxis   . Glaucoma   . High cholesterol   . Neuropathy     Patient Active Problem List   Diagnosis Date Noted  . Enteritis due to Norovirus 08/03/2017  . Hypokalemia 08/03/2017  . Dehydration   . Acute gastroenteritis 08/02/2017  . Hyperlipemia 08/02/2017  . Glaucoma 08/02/2017    Past Surgical History:  Procedure Laterality Date  . BACK SURGERY    . CATARACT EXTRACTION Bilateral 2007  . NASAL ENDOSCOPY WITH EPISTAXIS CONTROL Right 10/11/2017   Procedure: NASAL CAUTERY AND  BIOPSY OF RIGHT NASAL GRANULATION TISSUE;  Surgeon: [name], MD;  Location: [facility];  Service: ENT;  Laterality: Right;  . NASAL ENDOSCOPY WITH EPISTAXIS CONTROL Right 10/22/2017   Procedure: NASAL ENDOSCOPY WITH RIGHT EPISTAXIS CONTROL;  Surgeon: [name], MD;  Location: [facility];  Service: ENT;  Laterality: Right;  . TONSILLECTOMY  1950       Family History  Problem Relation Age of Onset  . Cerebral aneurysm Mother   . Heart disease Father   . Stroke Father   . Neuropathy Neg Hx     Social  History   Tobacco Use  . Smoking status: Never Smoker  . Smokeless tobacco: Never Used  Vaping Use  . Vaping Use: Never used  Substance Use Topics  . Alcohol use: No  . Drug use: No    Home Medications Prior to Admission medications   Medication Sig Start Date End Date Taking? Authorizing Provider  atorvastatin (LIPITOR) 10 MG tablet Take 5 mg by mouth daily.  08/16/16   [provider]  dorzolamide-timolol (COSOPT) 22.3-6.8 MG/ML ophthalmic solution Place 1 drop into the left eye 2 (two) times daily.    [provider]  latanoprost (XALATAN) 0.005 % ophthalmic solution Place 1 drop into both eyes at bedtime.  07/18/16   [provider]  Multiple Vitamin (MULTIVITAMIN) tablet Take 1 tablet by mouth daily.    [provider]    Allergies    Codeine  Review of Systems   Review of Systems  Constitutional: Positive for fatigue. Negative for appetite change.  HENT: Negative for congestion.   Respiratory: Negative for shortness of breath.   Cardiovascular: Positive for chest pain.  Gastrointestinal: Negative for abdominal pain.  Genitourinary: Negative for flank pain.  Musculoskeletal: Negative for back pain.  Skin: Negative for pallor.  Neurological: Negative for weakness.  Psychiatric/Behavioral: Negative for  confusion.    Physical Exam Updated Vital Signs BP (!) 174/77 (BP Location: Right Arm)   Pulse 62   Temp 98.3 F (36.8 C) (Oral)   Resp 14   Ht 6' (1.829 m)   Wt 87.1 kg   SpO2 97%   BMI 26.04 kg/m   Physical Exam Vitals and nursing note reviewed.  HENT:     Head: Atraumatic.  Cardiovascular:     Rate and Rhythm: Normal rate and regular rhythm.  Pulmonary:     Effort: Pulmonary effort is normal.     Breath sounds: No wheezing, rhonchi or rales.  Chest:     Chest wall: No tenderness.  Abdominal:     Palpations: There is no splenomegaly or mass.     Tenderness: There is no abdominal tenderness.  Musculoskeletal:      Right lower leg: No edema.     Left lower leg: No edema.  Skin:    General: Skin is warm.     Capillary Refill: Capillary refill takes less than 2 seconds.  Neurological:     Mental Status: He is alert.     ED Results / Procedures / Treatments   Labs (all labs ordered are listed, but only abnormal results are displayed) Labs Reviewed  BASIC METABOLIC PANEL - Abnormal; Notable for the following components:      Result Value   Glucose, Bld 126 (*)    All other components within normal limits  HEPATIC FUNCTION PANEL - Abnormal; Notable for the following components:   Total Protein 5.9 (*)    AST 103 (*)    ALT 63 (*)    All other components within normal limits  CBC  LIPASE, BLOOD  TROPONIN I (HIGH SENSITIVITY)  TROPONIN I (HIGH SENSITIVITY)    EKG EKG Interpretation  Date/Time:  Friday October 31 2019 21:11:55 EDT Ventricular Rate:  63 PR Interval:    QRS Duration: 101 QT Interval:  435 QTC Calculation: 446 R Axis:   11 Text Interpretation: Sinus rhythm Confirmed by Davonna Belling [phone]) on 10/31/2019 9:52:31 PM   Radiology DG Chest 2 View  Result Date: 10/31/2019 CLINICAL DATA:  Chest pain EXAM: CHEST - 2 VIEW COMPARISON:  08/13/2006 FINDINGS: Upper normal heart size. Accentuation of aortic arch silhouette versus prior study. Mediastinal contours and pulmonary vascularity normal. Lungs clear. No infiltrate, pleural effusion or pneumothorax. Bones appear demineralized. IMPRESSION: Prominent aortic arch silhouette, cannot exclude aortic aneurysm; consider CTA imaging for further assessment. No acute infiltrate. Findings called to Dr. Alvino Chapel on 10/31/2019 at 2101 hrs. Electronically Signed   By: Lavonia Dana M.D.   On: 10/31/2019 21:02   CT Angio Chest/Abd/Pel for Dissection W and/or Wo Contrast  Result Date: 10/31/2019 CLINICAL DATA:  74 year old male with chest and back pain. Concern for aortic dissection. EXAM: CT ANGIOGRAPHY CHEST, ABDOMEN AND PELVIS TECHNIQUE:  Non-contrast CT of the chest was initially obtained. Multidetector CT imaging through the chest, abdomen and pelvis was performed using the standard protocol during bolus administration of intravenous contrast. Multiplanar reconstructed images and MIPs were obtained and reviewed to evaluate the vascular anatomy. CONTRAST:  186mL OMNIPAQUE IOHEXOL 350 MG/ML SOLN COMPARISON:  Chest radiograph dated 10/31/2019. FINDINGS: CTA CHEST FINDINGS Cardiovascular: There is mild cardiomegaly. No pericardial effusion. There is mild atherosclerotic calcification of the thoracic aorta. No aneurysmal dilatation or dissection. The origins of the great vessels the aortic arch appear patent as visualized. There is common origin of the right brachiocephalic trunk and left common carotid artery.  There is no CT evidence of pulmonary embolism. Mediastinum/Nodes: No hilar or mediastinal adenopathy. The esophagus and the thyroid gland are grossly unremarkable. No mediastinal fluid collection. Lungs/Pleura: The lungs are clear. There is no pleural effusion pneumothorax. The central airways are patent. Musculoskeletal: Degenerative changes of the spine. No acute osseous pathology. Review of the MIP images confirms the above findings. CTA ABDOMEN AND PELVIS FINDINGS VASCULAR Aorta: Mild atherosclerotic calcification. No aneurysmal dilatation or dissection. No periaortic fluid collection. Celiac: Patent without evidence of aneurysm, dissection, vasculitis or significant stenosis. SMA: Patent without evidence of aneurysm, dissection, vasculitis or significant stenosis. Renals: Both renal arteries are patent without evidence of aneurysm, dissection, vasculitis, fibromuscular dysplasia or significant stenosis. IMA: Patent without evidence of aneurysm, dissection, vasculitis or significant stenosis. Inflow: Patent without evidence of aneurysm, dissection, vasculitis or significant stenosis. Veins: No obvious venous abnormality within the limitations  of this arterial phase study. Review of the MIP images confirms the above findings. NON-VASCULAR No intra-abdominal free air or free fluid. Hepatobiliary: The liver is unremarkable. No intrahepatic biliary ductal dilatation. There is apparent gallbladder wall thickening and haziness with mild stranding of the pericholecystic fat concerning for acute cholecystitis. No calcified gallstone identified. Further evaluation with ultrasound recommended. Pancreas: There is a 14 mm hypodense lesion in the tail of the pancreas which is not characterized but may represent a side branch IPMN. Further characterization with MRI without and with contrast on a nonemergent/outpatient basis recommended. The pancreas is otherwise unremarkable. No active inflammatory changes. There is no dilatation of the main pancreatic duct or gland atrophy. Spleen: Normal in size without focal abnormality. Adrenals/Urinary Tract: The adrenal glands unremarkable. The kidneys, visualized ureters, appear unremarkable. Small amount of layering high attenuation content within the urinary bladder likely represents excreted contrast. Small stones or proteinaceous debris are less likely but not excluded. Correlation with urinalysis recommended. Stomach/Bowel: There is sigmoid diverticulosis without active inflammatory changes. There is moderate stool throughout the colon. There is no bowel obstruction or active inflammation. The appendix is normal. Apparent soft tissue mass in the left anterior pelvis (263/6) likely a cluster of adjacent small bowel loops. Attention on follow-up imaging recommended. Lymphatic: No adenopathy. Reproductive: The prostate and seminal vesicles are grossly unremarkable. No pelvic mass. Other: None Musculoskeletal: Osteopenia with degenerative changes of the spine and scoliosis. No acute osseous pathology. Review of the MIP images confirms the above findings. IMPRESSION: 1. No aortic dissection or aneurysm. No CT evidence of  pulmonary embolism. 2. Findings concerning for acute cholecystitis. Further evaluation with ultrasound recommended. 3. A 14 mm hypodense lesion in the tail of the pancreas, not characterized but may represent a side branch IPMN. Further characterization with MRI without and with contrast on a nonemergent/outpatient basis recommended. 4. Sigmoid diverticulosis. No bowel obstruction. Normal appendix. Electronically Signed   By: Anner Crete M.D.   On: 10/31/2019 23:17    Procedures Procedures (including critical care time)  Medications Ordered in ED Medications  sodium chloride flush (NS) 0.9 % injection 3 mL (has no administration in time range)  iohexol (OMNIPAQUE) 350 MG/ML injection 100 mL (100 mLs Intravenous Contrast Given 10/31/19 2247)    ED Course  I have reviewed the triage vital signs and the nursing notes.  Pertinent labs & imaging results that were available during my care of the patient were reviewed by me and considered in my medical decision making (see chart for details).    MDM Rules/Calculators/A&P  Patient with chest pain.  Anterior chest.  Began acutely.  Waxes and wane somewhat but constant.  EKG reassuring.  Troponin negative x2.  However lipase is likely very elevated as it is having to be diluted.  LFTs mildly elevated.  CT angiography done due to evaluate for aortic dissection.  No aortic dissection but does show potential cholecystitis.  Will get ultrasound.  Also has pancreatic lesion which will need to be followed later.  Care turned over to Dr. Dayna Barker  Final Clinical Impression(s) / ED Diagnoses Final diagnoses:  Chest pain, unspecified type    Rx / DC Orders ED Discharge Orders    None       Davonna Belling, MD 10/31/19 2339

## 2019-10-31 NOTE — ED Provider Notes (Signed)
11:35 PM Assumed care from Dr. Alvino Chapel, please see their note for full history, physical and decision making until this point. In brief this is a 74 y.o. year old male who presented to the ED tonight with Chest Pain     Chest is ok. Concerning findings for cholecysitis on CT or pancreatitis (lipase being diluted). Will likely need admission.  Lipase elevated. No cholecystitis on [place]. Will admit.   Discussed with Dr. Cyd Silence, requests rad consultation for next best imaging test. Patient pain free at this time.   Spoke with Dr. Marguerita Merles, who thinks GB is much more likely. recommends HIDA. if that is negative and symptomatic then move towards mr abdomen w/wo + MRCP. Relayed information to Dr. Cyd Silence via text page.   Labs, studies and imaging reviewed by myself and considered in medical decision making if ordered. Imaging interpreted by radiology.  Labs Reviewed  BASIC METABOLIC PANEL - Abnormal; Notable for the following components:      Result Value   Glucose, Bld 126 (*)    All other components within normal limits  HEPATIC FUNCTION PANEL - Abnormal; Notable for the following components:   Total Protein 5.9 (*)    AST 103 (*)    ALT 63 (*)    All other components within normal limits  CBC  LIPASE, BLOOD  TROPONIN I (HIGH SENSITIVITY)  TROPONIN I (HIGH SENSITIVITY)    CT Angio Chest/Abd/Pel for Dissection W and/or Wo Contrast  Final Result    DG Chest 2 View  Final Result    US Abdomen Limited    (Results Pending)    No follow-ups on file.    Rontavious Albright, Corene Cornea, MD 11/01/19 [phone]

## 2019-11-01 ENCOUNTER — Emergency Department (HOSPITAL_COMMUNITY): Payer: PPO

## 2019-11-01 ENCOUNTER — Encounter (HOSPITAL_COMMUNITY): Payer: Self-pay | Admitting: Internal Medicine

## 2019-11-01 ENCOUNTER — Observation Stay (HOSPITAL_COMMUNITY): Payer: PPO

## 2019-11-01 DIAGNOSIS — K869 Disease of pancreas, unspecified: Secondary | ICD-10-CM | POA: Diagnosis not present

## 2019-11-01 DIAGNOSIS — H409 Unspecified glaucoma: Secondary | ICD-10-CM

## 2019-11-01 DIAGNOSIS — E782 Mixed hyperlipidemia: Secondary | ICD-10-CM | POA: Diagnosis not present

## 2019-11-01 DIAGNOSIS — R1013 Epigastric pain: Secondary | ICD-10-CM | POA: Diagnosis not present

## 2019-11-01 DIAGNOSIS — R1011 Right upper quadrant pain: Secondary | ICD-10-CM | POA: Diagnosis not present

## 2019-11-01 DIAGNOSIS — K8050 Calculus of bile duct without cholangitis or cholecystitis without obstruction: Secondary | ICD-10-CM | POA: Diagnosis not present

## 2019-11-01 LAB — CBC WITH DIFFERENTIAL/PLATELET
Abs Immature Granulocytes: 0.04 10*3/uL (ref 0.00–0.07)
Basophils Absolute: 0 10*3/uL (ref 0.0–0.1)
Basophils Relative: 0 %
Eosinophils Absolute: 0.1 10*3/uL (ref 0.0–0.5)
Eosinophils Relative: 1 %
HCT: 42.3 % (ref 39.0–52.0)
Hemoglobin: 14.3 g/dL (ref 13.0–17.0)
Immature Granulocytes: 0 %
Lymphocytes Relative: 12 %
Lymphs Abs: 1.1 10*3/uL (ref 0.7–4.0)
MCH: 28.7 pg (ref 26.0–34.0)
MCHC: 33.8 g/dL (ref 30.0–36.0)
MCV: 84.9 fL (ref 80.0–100.0)
Monocytes Absolute: 0.6 10*3/uL (ref 0.1–1.0)
Monocytes Relative: 6 %
Neutro Abs: 7.3 10*3/uL (ref 1.7–7.7)
Neutrophils Relative %: 81 %
Platelets: 158 10*3/uL (ref 150–400)
RBC: 4.98 MIL/uL (ref 4.22–5.81)
RDW: 13.4 % (ref 11.5–15.5)
WBC: 9.2 10*3/uL (ref 4.0–10.5)
nRBC: 0 % (ref 0.0–0.2)

## 2019-11-01 LAB — COMPREHENSIVE METABOLIC PANEL
ALT: 64 U/L — ABNORMAL HIGH (ref 0–44)
AST: 65 U/L — ABNORMAL HIGH (ref 15–41)
Albumin: 3.7 g/dL (ref 3.5–5.0)
Alkaline Phosphatase: 73 U/L (ref 38–126)
Anion gap: 9 (ref 5–15)
BUN: 15 mg/dL (ref 8–23)
CO2: 23 mmol/L (ref 22–32)
Calcium: 9 mg/dL (ref 8.9–10.3)
Chloride: 106 mmol/L (ref 98–111)
Creatinine, Ser: 0.85 mg/dL (ref 0.61–1.24)
GFR calc Af Amer: >60 mL/min (ref >60)
GFR calc non Af Amer: >60 mL/min (ref >60)
Glucose, Bld: 123 mg/dL — ABNORMAL HIGH (ref 70–99)
Potassium: 3.7 mmol/L (ref 3.5–5.1)
Sodium: 138 mmol/L (ref 135–145)
Total Bilirubin: 0.9 mg/dL (ref 0.3–1.2)
Total Protein: 6.3 g/dL — ABNORMAL LOW (ref 6.5–8.1)

## 2019-11-01 LAB — TROPONIN I (HIGH SENSITIVITY): Troponin I (High Sensitivity): 9 ng/L (ref <18)

## 2019-11-01 LAB — LIPASE, BLOOD: Lipase: 80 U/L — ABNORMAL HIGH (ref 11–51)

## 2019-11-01 LAB — MAGNESIUM: Magnesium: 1.8 mg/dL (ref 1.7–2.4)

## 2019-11-01 LAB — SARS CORONAVIRUS 2 BY RT PCR (HOSPITAL ORDER, PERFORMED IN ~~LOC~~ HOSPITAL LAB): SARS Coronavirus 2: NEGATIVE

## 2019-11-01 MED ORDER — LATANOPROST 0.005 % OP SOLN
1.0000 [drp] | Freq: Every day | OPHTHALMIC | Status: DC
  Filled 2019-11-01: qty 2.5

## 2019-11-01 MED ORDER — MORPHINE SULFATE (PF) 4 MG/ML IV SOLN: 4.0000 mg | INTRAVENOUS | Status: DC | PRN

## 2019-11-01 MED ORDER — ATORVASTATIN CALCIUM 10 MG PO TABS: 5.0000 mg | ORAL_TABLET | Freq: Every day | ORAL | Status: DC

## 2019-11-01 MED ORDER — FENTANYL CITRATE (PF) 100 MCG/2ML IJ SOLN: 50.0000 ug | Freq: Once | INTRAMUSCULAR | Status: DC

## 2019-11-01 MED ORDER — DORZOLAMIDE HCL-TIMOLOL MAL 2-0.5 % OP SOLN
1.0000 [drp] | Freq: Two times a day (BID) | OPHTHALMIC | Status: DC
  Administered 2019-11-01: 1 [drp] via OPHTHALMIC
  Filled 2019-11-01: qty 10

## 2019-11-01 MED ORDER — ONDANSETRON HCL 4 MG/2ML IJ SOLN: 4.0000 mg | Freq: Four times a day (QID) | INTRAMUSCULAR | Status: DC | PRN

## 2019-11-01 MED ORDER — PANTOPRAZOLE SODIUM 40 MG PO TBEC
40.0000 mg | DELAYED_RELEASE_TABLET | Freq: Every day | ORAL | 0 refills | Status: DC
Start: 2019-11-01 — End: 2020-01-28

## 2019-11-01 MED ORDER — LACTATED RINGERS IV SOLN: INTRAVENOUS | Status: DC

## 2019-11-01 MED ORDER — POLYETHYLENE GLYCOL 3350 17 G PO PACK: 17.0000 g | PACK | Freq: Every day | ORAL | Status: DC | PRN

## 2019-11-01 MED ORDER — ACETAMINOPHEN 650 MG RE SUPP: 650.0000 mg | Freq: Four times a day (QID) | RECTAL | Status: DC | PRN

## 2019-11-01 MED ORDER — ENOXAPARIN SODIUM 40 MG/0.4ML ~~LOC~~ SOLN
40.0000 mg | SUBCUTANEOUS | Status: DC
  Administered 2019-11-01: 40 mg via SUBCUTANEOUS
  Filled 2019-11-01: qty 0.4

## 2019-11-01 MED ORDER — ONDANSETRON HCL 4 MG PO TABS: 4.0000 mg | ORAL_TABLET | Freq: Four times a day (QID) | ORAL | Status: DC | PRN

## 2019-11-01 MED ORDER — MORPHINE SULFATE (PF) 2 MG/ML IV SOLN: 2.0000 mg | INTRAVENOUS | Status: DC | PRN

## 2019-11-01 MED ORDER — PANTOPRAZOLE SODIUM 40 MG IV SOLR
40.0000 mg | Freq: Every day | INTRAVENOUS | Status: DC
  Administered 2019-11-01: 40 mg via INTRAVENOUS
  Filled 2019-11-01: qty 40

## 2019-11-01 MED ORDER — GADOBUTROL 1 MMOL/ML IV SOLN
7.0000 mL | Freq: Once | INTRAVENOUS | Status: AC | PRN
  Administered 2019-11-01: 7 mL via INTRAVENOUS

## 2019-11-01 MED ORDER — ACETAMINOPHEN 325 MG PO TABS: 650.0000 mg | ORAL_TABLET | Freq: Four times a day (QID) | ORAL | Status: DC | PRN

## 2019-11-01 NOTE — ED Notes (Signed)
Pt CBG 47; Pt given juice and crackers. Pt A&O x 4

## 2019-11-01 NOTE — ED Notes (Signed)
Visually checked on Pt, Pt resting well, normal brething pattern, pt, bradycardic but has been so all night. Otherwise vitals WDL

## 2019-11-01 NOTE — Discharge Instructions (Signed)
Follow with [name], [name], MD in 2-3 weeks  Please get a complete blood count and chemistry panel checked by your Primary MD at your next visit, and again as instructed by your Primary MD. Please get your medications reviewed and adjusted by your Primary MD.  Please request your Primary MD to go over all Hospital Tests and Procedure/Radiological results at the follow up, please get all Hospital records sent to your Prim MD by signing hospital release before you go home.  In some cases, there will be blood work, cultures and biopsy results pending at the time of your discharge. Please request that your primary care M.D. goes through all the records of your hospital data and follows up on these results.  If you had Pneumonia of Lung problems at the Hospital: Please get a 2 view Chest X ray done in 6-8 weeks after hospital discharge or sooner if instructed by your Primary MD.  If you have Congestive Heart Failure: Please call your Cardiologist or Primary MD anytime you have any of the following symptoms:  1) 3 pound weight gain in 24 hours or 5 pounds in 1 week  2) shortness of breath, with or without a dry hacking cough  3) swelling in the hands, feet or stomach  4) if you have to sleep on extra pillows at night in order to breathe  Follow cardiac low salt diet and 1.5 lit/day fluid restriction.  If you have diabetes Accuchecks 4 times/day, Once in AM empty stomach and then before each meal. Log in all results and show them to your primary doctor at your next visit. If any glucose reading is under 80 or above 300 call your primary MD immediately.  If you have Seizure/Convulsions/Epilepsy: Please do not drive, operate heavy machinery, participate in activities at heights or participate in high speed sports until you have seen by Primary MD or a Neurologist and advised to do so again. Per [facility] statutes, patients with seizures are not allowed to drive until they have been  seizure-free for six months.  Use caution when using heavy equipment or power tools. Avoid working on ladders or at heights. Take showers instead of baths. Ensure the water temperature is not too high on the home water heater. Do not go swimming alone. Do not lock yourself in a room alone (i.e. bathroom). When caring for infants or small children, sit down when holding, feeding, or changing them to minimize risk of injury to the child in the event you have a seizure. Maintain good sleep hygiene. Avoid alcohol.   If you had Gastrointestinal Bleeding: Please ask your Primary MD to check a complete blood count within one week of discharge or at your next visit. Your endoscopic/colonoscopic biopsies that are pending at the time of discharge, will also need to followed by your Primary MD.  Get Medicines reviewed and adjusted. Please take all your medications with you for your next visit with your Primary MD  Please request your Primary MD to go over all hospital tests and procedure/radiological results at the follow up, please ask your Primary MD to get all Hospital records sent to his/her office.  If you experience worsening of your admission symptoms, develop shortness of breath, life threatening emergency, suicidal or homicidal thoughts you must seek medical attention immediately by calling 911 or calling your MD immediately  if symptoms less severe.  You must read complete instructions/literature along with all the possible adverse reactions/side effects for all the Medicines you  take and that have been prescribed to you. Take any new Medicines after you have completely understood and accpet all the possible adverse reactions/side effects.   Do not drive or operate heavy machinery when taking Pain medications.   Do not take more than prescribed Pain, Sleep and Anxiety Medications  Special Instructions: If you have smoked or chewed Tobacco  in the last 2 yrs please stop smoking, stop any regular  Alcohol  and or any Recreational drug use.  Wear Seat belts while driving.  Please note You were cared for by a hospitalist during your hospital stay. If you have any questions about your discharge medications or the care you received while you were in the hospital after you are discharged, you can call the unit and asked to speak with the hospitalist on call if the hospitalist that took care of you is not available. Once you are discharged, your primary care physician will handle any further medical issues. Please note that NO REFILLS for any discharge medications will be authorized once you are discharged, as it is imperative that you return to your primary care physician (or establish a relationship with a primary care physician if you do not have one) for your aftercare needs so that they can reassess your need for medications and monitor your lab values.  You can reach the hospitalist office at phone [phone] or fax [phone]   If you do not have a primary care physician, you can call [phone] for a physician referral.  Activity: As tolerated with Full fall precautions use walker/cane & assistance as needed    Diet: regular  Disposition Home

## 2019-11-01 NOTE — ED Notes (Signed)
Report given to [place]

## 2019-11-01 NOTE — H&P (Signed)
History and Physical    Roberto Rosales HCW:[id number] DOB: [birth year] DOA: 10/31/2019  PCP: Manon Hilding, MD  Patient coming from: Home   Chief Complaint:  Chief Complaint  Patient presents with  . Chest Pain     HPI:    74 year old male with past medical history of hyperlipidemia and glaucoma who presents to [facility] emergency department with complaints of abdominal pain.  Patient explains that yesterday evening, shortly after eating dinner at approximately 6:30 PM he began to experience epigastric abdominal and midsternal pain.  Patient describes his pain is severe in intensity, knifelike in quality, nonradiating.  Patient is unable to identify any alleviating or exacerbating factors.  Patient denies any associated nausea, vomiting, diarrhea.  Patient denies any fevers, sick contacts, dysuria.  Upon further questioning patient also endorses a 1 week history of waxing and waning right-sided flank pain.  Patient describes his pain as dull in quality without any alleviating or exacerbating factors.  Patient also complains of a 1 week history of generalized malaise.  After several hours of intense epigastric abdominal and midsternal chest discomfort patient eventually presented to [facility] emergency department for evaluation.  Upon evaluation in the emergency department, because of patient's vague presentation CT angiogram of the abdomen and pelvis was performed to evaluate for dissection.  This identified no evidence of aortic dissection but did identify findings concerning for possible acute cholecystitis.  Additionally a incidental lesion was noted in the tail of the pancreas.  Right upper quadrant ultrasound was performed which found no sonographic features of cholecystitis.  Case was discussed with radiology who again felt that acute cholecystitis need to be reevaluated and recommended HIDA scan.  Case was additionally discussed with Dr. Redmond Pulling with surgery who  recommended obtaining a HIDA scan and contacting surgery for formal consultation if it was abnormal.  Lipase was found to be elevated at 610.  The hospitalist group was then called to assess the patient admission the hospital.     Review of Systems: A 10-system review of systems has been performed and all systems are negative with the exception of what is listed in the HPI.    Past Medical History:  Diagnosis Date  . Epistaxis   . Glaucoma   . High cholesterol   . Neuropathy     Past Surgical History:  Procedure Laterality Date  . BACK SURGERY    . CATARACT EXTRACTION Bilateral 2007  . NASAL ENDOSCOPY WITH EPISTAXIS CONTROL Right 10/11/2017   Procedure: NASAL CAUTERY AND  BIOPSY OF RIGHT NASAL GRANULATION TISSUE;  Surgeon: [name], MD;  Location: [facility];  Service: ENT;  Laterality: Right;  . NASAL ENDOSCOPY WITH EPISTAXIS CONTROL Right 10/22/2017   Procedure: NASAL ENDOSCOPY WITH RIGHT EPISTAXIS CONTROL;  Surgeon: [name], MD;  Location: [facility];  Service: ENT;  Laterality: Right;  . TONSILLECTOMY  1950     reports that he has never smoked. He has never used smokeless tobacco. He reports that he does not drink alcohol and does not use drugs.  Allergies  Allergen Reactions  . Codeine Nausea Only    Family History  Problem Relation Age of Onset  . Cerebral aneurysm Mother   . Heart disease Father   . Stroke Father   . Neuropathy Neg Hx      Prior to Admission medications   Medication Sig Start Date End Date Taking? Authorizing Provider  atorvastatin (LIPITOR) 10 MG tablet Take 5 mg by mouth daily.  08/16/16  Yes [provider]  dorzolamide-timolol (COSOPT) 22.3-6.8 MG/ML ophthalmic solution Place 1 drop into the left eye 2 (two) times daily.   Yes [provider]  latanoprost (XALATAN) 0.005 % ophthalmic solution Place 1 drop into both eyes at bedtime.  07/18/16  Yes [provider]  Multiple Vitamin (MULTIVITAMIN) tablet Take 1  tablet by mouth daily.   Yes [provider]    Physical Exam: Vitals:   10/31/19 2331 11/01/19 0001 11/01/19 0156 11/01/19 0418  BP: (!) 178/76  (!) 154/81 (!) 168/75  Pulse: 97 (!) 32 (!) 57 (!) 54  Resp: 18 17 15 14   Temp:      TempSrc:      SpO2: 95% 95% 97% 97%  Weight:      Height:         Constitutional: Acute alert and oriented x3, no associated distress.   Skin: no rashes, no lesions, good skin turgor noted. Eyes: Pupils are equally reactive to light.  No evidence of scleral icterus or conjunctival pallor.  ENMT: Moist mucous membranes noted.  Posterior pharynx clear of any exudate or lesions.   Neck: normal, supple, no masses, no thyromegaly.  No evidence of jugular venous distension.   Respiratory: clear to auscultation bilaterally, no wheezing, no crackles. Normal respiratory effort. No accessory muscle use.  Cardiovascular: Regular rate and rhythm, no murmurs / rubs / gallops. No extremity edema. 2+ pedal pulses. No carotid bruits.  Chest:   Nontender without crepitus or deformity.   Back:   Nontender without crepitus or deformity. Abdomen: Mild epigastric tenderness, abdomen is soft.  no evidence of intra-abdominal masses.  Positive bowel sounds noted in all quadrants.   Musculoskeletal: No joint deformity upper and lower extremities. Good ROM, no contractures. Normal muscle tone.  Neurologic: CN 2-12 grossly intact. Sensation intact, strength noted to be 5 out of 5 in all 4 extremities.  Patient is following all commands.  Patient is responsive to verbal stimuli.   Psychiatric: Patient presents as a normal mood with appropriate affect.  Patient seems to possess insight as to theircurrent situation.     Labs on Admission: I have personally reviewed following labs and imaging studies -   CBC: Recent Labs  Lab 10/31/19 2015  WBC 6.6  HGB 14.9  HCT 44.3  MCV 86.2  PLT 619   Basic Metabolic Panel: Recent Labs  Lab 10/31/19 2015  NA 138  K 3.7  CL  105  CO2 26  GLUCOSE 126*  BUN 18  CREATININE 1.01  CALCIUM 9.0   GFR: Estimated Creatinine Clearance: 70.4 mL/min (by C-G formula based on SCr of 1.01 mg/dL). Liver Function Tests: Recent Labs  Lab 10/31/19 2120  AST 103*  ALT 63*  ALKPHOS 79  BILITOT 0.7  PROT 5.9*  ALBUMIN 3.7   Recent Labs  Lab 10/31/19 2120  LIPASE 610*   No results for input(s): AMMONIA in the last 168 hours. Coagulation Profile: No results for input(s): INR, PROTIME in the last 168 hours. Cardiac Enzymes: No results for input(s): CKTOTAL, CKMB, CKMBINDEX, TROPONINI in the last 168 hours. BNP (last 3 results) No results for input(s): PROBNP in the last 8760 hours. HbA1C: No results for input(s): HGBA1C in the last 72 hours. CBG: No results for input(s): GLUCAP in the last 168 hours. Lipid Profile: No results for input(s): CHOL, HDL, LDLCALC, TRIG, CHOLHDL, LDLDIRECT in the last 72 hours. Thyroid Function Tests: No results for input(s): TSH, T4TOTAL, FREET4, T3FREE, THYROIDAB in  the last 72 hours. Anemia Panel: No results for input(s): VITAMINB12, FOLATE, FERRITIN, TIBC, IRON, RETICCTPCT in the last 72 hours. Urine analysis: No results found for: COLORURINE, APPEARANCEUR, [place], [place], GLUCOSEU, HGBUR, BILIRUBINUR, KETONESUR, PROTEINUR, UROBILINOGEN, NITRITE, LEUKOCYTESUR  Radiological Exams on Admission - Personally Reviewed: DG Chest 2 View  Result Date: 10/31/2019 CLINICAL DATA:  Chest pain EXAM: CHEST - 2 VIEW COMPARISON:  08/13/2006 FINDINGS: Upper normal heart size. Accentuation of aortic arch silhouette versus prior study. Mediastinal contours and pulmonary vascularity normal. Lungs clear. No infiltrate, pleural effusion or pneumothorax. Bones appear demineralized. IMPRESSION: Prominent aortic arch silhouette, cannot exclude aortic aneurysm; consider CTA imaging for further assessment. No acute infiltrate. Findings called to Dr. Alvino Chapel on 10/31/2019 at 2101 hrs. Electronically Signed    By: Lavonia Dana M.D.   On: 10/31/2019 21:02   US Abdomen Limited  Result Date: 11/01/2019 CLINICAL DATA:  Right upper quadrant pain, abnormal CT EXAM: ULTRASOUND ABDOMEN LIMITED RIGHT UPPER QUADRANT COMPARISON:  CT 10/31/2019 FINDINGS: Gallbladder: Gallbladder wall thickness of 2.3 mm is within normal limits. No pericholecystic fluid is seen. Sonographic Percell Miller sign is reportedly negative. No visible calcified gallstones or biliary sludge. Common bile duct: Diameter: 3.7 mm proximally, distally 5.2 mm.  Nondilated. Liver: No focal lesion identified. Within normal limits in parenchymal echogenicity. Portal vein is patent on color Doppler imaging with normal direction of blood flow towards the liver. Other: None. IMPRESSION: No sonographic features of acute cholecystitis. If there is persisting clinical concern given the discordant CT findings, consider further evaluation with HIDA. Electronically Signed   By: Lovena Le M.D.   On: 11/01/2019 02:01   CT Angio Chest/Abd/Pel for Dissection W and/or Wo Contrast  Result Date: 10/31/2019 CLINICAL DATA:  74 year old male with chest and back pain. Concern for aortic dissection. EXAM: CT ANGIOGRAPHY CHEST, ABDOMEN AND PELVIS TECHNIQUE: Non-contrast CT of the chest was initially obtained. Multidetector CT imaging through the chest, abdomen and pelvis was performed using the standard protocol during bolus administration of intravenous contrast. Multiplanar reconstructed images and MIPs were obtained and reviewed to evaluate the vascular anatomy. CONTRAST:  151mL OMNIPAQUE IOHEXOL 350 MG/ML SOLN COMPARISON:  Chest radiograph dated 10/31/2019. FINDINGS: CTA CHEST FINDINGS Cardiovascular: There is mild cardiomegaly. No pericardial effusion. There is mild atherosclerotic calcification of the thoracic aorta. No aneurysmal dilatation or dissection. The origins of the great vessels the aortic arch appear patent as visualized. There is common origin of the right  brachiocephalic trunk and left common carotid artery. There is no CT evidence of pulmonary embolism. Mediastinum/Nodes: No hilar or mediastinal adenopathy. The esophagus and the thyroid gland are grossly unremarkable. No mediastinal fluid collection. Lungs/Pleura: The lungs are clear. There is no pleural effusion pneumothorax. The central airways are patent. Musculoskeletal: Degenerative changes of the spine. No acute osseous pathology. Review of the MIP images confirms the above findings. CTA ABDOMEN AND PELVIS FINDINGS VASCULAR Aorta: Mild atherosclerotic calcification. No aneurysmal dilatation or dissection. No periaortic fluid collection. Celiac: Patent without evidence of aneurysm, dissection, vasculitis or significant stenosis. SMA: Patent without evidence of aneurysm, dissection, vasculitis or significant stenosis. Renals: Both renal arteries are patent without evidence of aneurysm, dissection, vasculitis, fibromuscular dysplasia or significant stenosis. IMA: Patent without evidence of aneurysm, dissection, vasculitis or significant stenosis. Inflow: Patent without evidence of aneurysm, dissection, vasculitis or significant stenosis. Veins: No obvious venous abnormality within the limitations of this arterial phase study. Review of the MIP images confirms the above findings. NON-VASCULAR No intra-abdominal free air or free fluid. Hepatobiliary:  The liver is unremarkable. No intrahepatic biliary ductal dilatation. There is apparent gallbladder wall thickening and haziness with mild stranding of the pericholecystic fat concerning for acute cholecystitis. No calcified gallstone identified. Further evaluation with ultrasound recommended. Pancreas: There is a 14 mm hypodense lesion in the tail of the pancreas which is not characterized but may represent a side branch IPMN. Further characterization with MRI without and with contrast on a nonemergent/outpatient basis recommended. The pancreas is otherwise  unremarkable. No active inflammatory changes. There is no dilatation of the main pancreatic duct or gland atrophy. Spleen: Normal in size without focal abnormality. Adrenals/Urinary Tract: The adrenal glands unremarkable. The kidneys, visualized ureters, appear unremarkable. Small amount of layering high attenuation content within the urinary bladder likely represents excreted contrast. Small stones or proteinaceous debris are less likely but not excluded. Correlation with urinalysis recommended. Stomach/Bowel: There is sigmoid diverticulosis without active inflammatory changes. There is moderate stool throughout the colon. There is no bowel obstruction or active inflammation. The appendix is normal. Apparent soft tissue mass in the left anterior pelvis (263/6) likely a cluster of adjacent small bowel loops. Attention on follow-up imaging recommended. Lymphatic: No adenopathy. Reproductive: The prostate and seminal vesicles are grossly unremarkable. No pelvic mass. Other: None Musculoskeletal: Osteopenia with degenerative changes of the spine and scoliosis. No acute osseous pathology. Review of the MIP images confirms the above findings. IMPRESSION: 1. No aortic dissection or aneurysm. No CT evidence of pulmonary embolism. 2. Findings concerning for acute cholecystitis. Further evaluation with ultrasound recommended. 3. A 14 mm hypodense lesion in the tail of the pancreas, not characterized but may represent a side branch IPMN. Further characterization with MRI without and with contrast on a nonemergent/outpatient basis recommended. 4. Sigmoid diverticulosis. No bowel obstruction. Normal appendix. Electronically Signed   By: Anner Crete M.D.   On: 10/31/2019 23:17    EKG: Personally reviewed.  Rhythm is normal sinus rhythm with heart rate of 63 bpm.  No dynamic ST segment changes appreciated.  Assessment/Plan Principal Problem:   Epigastric pain   Patient presenting with an extremely vague  presentation.  Initially, ER provider was concerned for abdominal aortic dissection which was ruled out via CT angiogram with dissection protocol of the abdomen.  This is incidentally found possible cholecystitis, however when follow-up right upper quadrant ultrasound was performed this found no sonographic evidence of cholecystitis.  Furthermore, lipase is found to be 610 suggestive of pancreatitis however there is no CT evidence of this.  Abdominal exam and presentation both are not particularly compelling for either pancreatitis or cholecystitis.  Case has been discussed with Dr. Redmond Pulling who recommends HIDA scan and formal general surgery consultation if HIDA scan suggests acute cholecystitis.  Patient's history sounds more suggestive of esophageal spasm -will try trial of GI cocktail   Troponins unremarkable  We will keep n.p.o. with clear liquids and ice chips for now  Obtaining HIDA scan.  Hydrating patient with intravenous isotonic fluids  As needed intravenous analgesics.  If the above work-up ends up being negative patient should undergo outpatient GI evaluation including MRI of incidental pancreatic lesion and EGD  Active Problems:   Lesion of pancreas  Incidental lesion of the pancreatic tail seen on CT imaging of the abdomen  This lesion is unlikely the cause of the patient's symptoms  CA 19-9 ordered  Patient should undergo outpatient GI evaluation with outpatient MRI to better evaluate the possibility of IPMN.    Mixed hyperlipidemia   Continue home regimen statin therapy  AST and ALT minimally elevated and are not high enough to warrant discontinuing statin therapy.    Glaucoma    Continue home regimen of maintenance eyedrops   Code Status:  Full code Family Communication: deferred   Status is: Observation  The patient remains OBS appropriate and will d/c before 2 midnights.  Dispo: The patient is from: Home              Anticipated d/c is to:  Home              Anticipated d/c date is: 2 days              Patient currently is not medically stable to d/c.        Vernelle Emerald MD [facility] Pager [phone]  If 7PM-7AM, please contact night-coverage [email or web address] Use universal [place] password for that web site. If you do not have the password, please call the hospital operator.  11/01/2019, 5:12 AM

## 2019-11-01 NOTE — Discharge Summary (Signed)
Physician Discharge Summary  Roberto Rosales LPF:[id number] DOB: [birth year] DOA: 10/31/2019  PCP: Manon Hilding, MD  Admit date: 10/31/2019 Discharge date: 11/01/2019  Admitted From: home Disposition:  home  Recommendations for Outpatient Follow-up:  1. Follow up with PCP in 1-2 weeks  Home Health: none Equipment/Devices: none  Discharge Condition: stable CODE STATUS: Full code Diet recommendation: regular  HPI: Per admitting MD, 74 year old male with past medical history of hyperlipidemia and glaucoma who presents to [facility] emergency department with complaints of abdominal pain. Patient explains that yesterday evening, shortly after eating dinner at approximately 6:30 PM he began to experience epigastric abdominal and midsternal pain.  Patient describes his pain is severe in intensity, knifelike in quality, nonradiating.  Patient is unable to identify any alleviating or exacerbating factors.  Patient denies any associated nausea, vomiting, diarrhea.  Patient denies any fevers, sick contacts, dysuria. Upon further questioning patient also endorses a 1 week history of waxing and waning right-sided flank pain.  Patient describes his pain as dull in quality without any alleviating or exacerbating factors.  Patient also complains of a 1 week history of generalized malaise.  After several hours of intense epigastric abdominal and midsternal chest discomfort patient eventually presented to [facility] emergency department for evaluation.  Upon evaluation in the emergency department, because of patient's vague presentation CT angiogram of the abdomen and pelvis was performed to evaluate for dissection.  This identified no evidence of aortic dissection but did identify findings concerning for possible acute cholecystitis.  Additionally a incidental lesion was noted in the tail of the pancreas.  Right upper quadrant ultrasound was performed which found no sonographic features of  cholecystitis.  Case was discussed with radiology who again felt that acute cholecystitis need to be reevaluated and recommended HIDA scan.  Case was additionally discussed with Dr. Redmond Pulling with surgery who recommended obtaining a HIDA scan and contacting surgery for formal consultation if it was abnormal.  Lipase was found to be elevated at 610.  The hospitalist group was then called to assess the patient admission the hospital.  Hospital Course / Discharge diagnoses: Epigastric pain -patient was admitted to the hospital with vague presentation with epigastric/lower chest pain that was of sudden onset after eating dinner and lasted for about couple hours.  Pain eventually subsided and returned around 4 AM which prompted him to come to the emergency room.  He underwent serial high-sensitivity troponin which were negative. He underwent a CT angiogram which was negative for acute findings except for concern for acute cholecystitis.  This was followed up with right upper quadrant ultrasound which was negative for acute cholecystitis.  Given ambiguity with imaging a HIDA scan was ordered however it could not be obtained over the weekend due to shortage of radiotracer.  Case was discussed with Dr. Donne Hazel with general surgery who recommended MRI/MRCP of the abdomen.  This was done and did not show any gallbladder pathology.  His CT scan and MRI did show a small pancreatic lesion which needs to be followed up as an outpatient with a repeat imaging in 6 months (full MRI, CT, ultrasound reads are all below).  Patient's symptoms have resolved and will be discharged home in stable condition.  He will be placed on a PPI.  Active Problems: Lesion of pancreas - outpatient follow up HLD- continue home statin Glaucoma - continue home medications   Discharge Instructions   Allergies as of 11/01/2019      Reactions  Codeine Nausea Only      Medication List    TAKE these medications   atorvastatin 10 MG  tablet Commonly known as: LIPITOR Take 5 mg by mouth daily.   dorzolamide-timolol 22.3-6.8 MG/ML ophthalmic solution Commonly known as: COSOPT Place 1 drop into the left eye 2 (two) times daily.   latanoprost 0.005 % ophthalmic solution Commonly known as: XALATAN Place 1 drop into both eyes at bedtime.   multivitamin tablet Take 1 tablet by mouth daily.   pantoprazole 40 MG tablet Commonly known as: Protonix Take 1 tablet (40 mg total) by mouth daily.       Follow-up Information    [name], [name], MD. Schedule an appointment as soon as possible for a visit in 3 week(s).   Specialty: Family Medicine Contact information: [place] [address] [phone]               Consultations:  None   Procedures/Studies:  DG Chest 2 View  Result Date: 10/31/2019 CLINICAL DATA:  Chest pain EXAM: CHEST - 2 VIEW COMPARISON:  08/13/2006 FINDINGS: Upper normal heart size. Accentuation of aortic arch silhouette versus prior study. Mediastinal contours and pulmonary vascularity normal. Lungs clear. No infiltrate, pleural effusion or pneumothorax. Bones appear demineralized. IMPRESSION: Prominent aortic arch silhouette, cannot exclude aortic aneurysm; consider CTA imaging for further assessment. No acute infiltrate. Findings called to Dr. Alvino Chapel on 10/31/2019 at 2101 hrs. Electronically Signed   By: Lavonia Dana M.D.   On: 10/31/2019 21:02   US Abdomen Limited  Result Date: 11/01/2019 CLINICAL DATA:  Right upper quadrant pain, abnormal CT EXAM: ULTRASOUND ABDOMEN LIMITED RIGHT UPPER QUADRANT COMPARISON:  CT 10/31/2019 FINDINGS: Gallbladder: Gallbladder wall thickness of 2.3 mm is within normal limits. No pericholecystic fluid is seen. Sonographic Percell Miller sign is reportedly negative. No visible calcified gallstones or biliary sludge. Common bile duct: Diameter: 3.7 mm proximally, distally 5.2 mm.  Nondilated. Liver: No focal lesion identified. Within normal limits in parenchymal  echogenicity. Portal vein is patent on color Doppler imaging with normal direction of blood flow towards the liver. Other: None. IMPRESSION: No sonographic features of acute cholecystitis. If there is persisting clinical concern given the discordant CT findings, consider further evaluation with HIDA. Electronically Signed   By: Lovena Le M.D.   On: 11/01/2019 02:01   MR ABDOMEN MRCP W WO CONTAST  Result Date: 11/01/2019 CLINICAL DATA:  Right upper quadrant pain. Pancreatitis. Indeterminate pancreatic lesion on recent abdomen CTA. EXAM: MRI ABDOMEN WITHOUT AND WITH CONTRAST (INCLUDING MRCP) TECHNIQUE: Multiplanar multisequence MR imaging of the abdomen was performed both before and after the administration of intravenous contrast. Heavily T2-weighted images of the biliary and pancreatic ducts were obtained, and three-dimensional MRCP images were rendered by post processing. CONTRAST:  24mL GADAVIST GADOBUTROL 1 MMOL/ML IV SOLN COMPARISON:  Abdomen CTA on 10/31/2019 FINDINGS: Lower chest: No acute findings. Hepatobiliary: No hepatic masses identified. Gallbladder is unremarkable. No evidence of biliary ductal dilatation or choledocholithiasis. Pancreas: New evidence of acute pancreatitis. No evidence of solid pancreatic mass. Simple appearing cystic lesion is seen in the pancreatic tail which measures 1.5 x 1.4 cm on image 18/5. No evidence of pancreatic ductal dilatation or pancreas divisum. Spleen:  Within normal limits in size and appearance. Adrenals/Urinary Tract: No masses identified. No evidence of hydronephrosis. Stomach/Bowel: Visualized portion unremarkable. Vascular/Lymphatic: No pathologically enlarged lymph nodes identified. No abdominal aortic aneurysm. Other:  None. Musculoskeletal:  No suspicious bone lesions identified. IMPRESSION: 1. No radiographic signs of  acute pancreatitis. 2. 1.5 cm simple cystic lesion in the pancreatic tail. Differential diagnosis includes indolent cystic neoplasm and  pseudocyst. Recommend continued follow-up by abdomen MRI without and with contrast in 6 months. This recommendation follows ACR consensus guidelines: Management of Incidental Pancreatic Cysts: A White Paper of the ACR Incidental Findings Committee. [place] [id number];60:737-106. 3. No evidence of biliary ductal dilatation or choledocholithiasis. Electronically Signed   By: Marlaine Hind M.D.   On: 11/01/2019 16:53   CT Angio Chest/Abd/Pel for Dissection W and/or Wo Contrast  Result Date: 10/31/2019 CLINICAL DATA:  74 year old male with chest and back pain. Concern for aortic dissection. EXAM: CT ANGIOGRAPHY CHEST, ABDOMEN AND PELVIS TECHNIQUE: Non-contrast CT of the chest was initially obtained. Multidetector CT imaging through the chest, abdomen and pelvis was performed using the standard protocol during bolus administration of intravenous contrast. Multiplanar reconstructed images and MIPs were obtained and reviewed to evaluate the vascular anatomy. CONTRAST:  147mL OMNIPAQUE IOHEXOL 350 MG/ML SOLN COMPARISON:  Chest radiograph dated 10/31/2019. FINDINGS: CTA CHEST FINDINGS Cardiovascular: There is mild cardiomegaly. No pericardial effusion. There is mild atherosclerotic calcification of the thoracic aorta. No aneurysmal dilatation or dissection. The origins of the great vessels the aortic arch appear patent as visualized. There is common origin of the right brachiocephalic trunk and left common carotid artery. There is no CT evidence of pulmonary embolism. Mediastinum/Nodes: No hilar or mediastinal adenopathy. The esophagus and the thyroid gland are grossly unremarkable. No mediastinal fluid collection. Lungs/Pleura: The lungs are clear. There is no pleural effusion pneumothorax. The central airways are patent. Musculoskeletal: Degenerative changes of the spine. No acute osseous pathology. Review of the MIP images confirms the above findings. CTA ABDOMEN AND PELVIS FINDINGS VASCULAR Aorta: Mild  atherosclerotic calcification. No aneurysmal dilatation or dissection. No periaortic fluid collection. Celiac: Patent without evidence of aneurysm, dissection, vasculitis or significant stenosis. SMA: Patent without evidence of aneurysm, dissection, vasculitis or significant stenosis. Renals: Both renal arteries are patent without evidence of aneurysm, dissection, vasculitis, fibromuscular dysplasia or significant stenosis. IMA: Patent without evidence of aneurysm, dissection, vasculitis or significant stenosis. Inflow: Patent without evidence of aneurysm, dissection, vasculitis or significant stenosis. Veins: No obvious venous abnormality within the limitations of this arterial phase study. Review of the MIP images confirms the above findings. NON-VASCULAR No intra-abdominal free air or free fluid. Hepatobiliary: The liver is unremarkable. No intrahepatic biliary ductal dilatation. There is apparent gallbladder wall thickening and haziness with mild stranding of the pericholecystic fat concerning for acute cholecystitis. No calcified gallstone identified. Further evaluation with ultrasound recommended. Pancreas: There is a 14 mm hypodense lesion in the tail of the pancreas which is not characterized but may represent a side branch IPMN. Further characterization with MRI without and with contrast on a nonemergent/outpatient basis recommended. The pancreas is otherwise unremarkable. No active inflammatory changes. There is no dilatation of the main pancreatic duct or gland atrophy. Spleen: Normal in size without focal abnormality. Adrenals/Urinary Tract: The adrenal glands unremarkable. The kidneys, visualized ureters, appear unremarkable. Small amount of layering high attenuation content within the urinary bladder likely represents excreted contrast. Small stones or proteinaceous debris are less likely but not excluded. Correlation with urinalysis recommended. Stomach/Bowel: There is sigmoid diverticulosis without  active inflammatory changes. There is moderate stool throughout the colon. There is no bowel obstruction or active inflammation. The appendix is normal. Apparent soft tissue mass in the left anterior pelvis (263/6) likely a cluster of adjacent small bowel loops. Attention on follow-up imaging recommended.  Lymphatic: No adenopathy. Reproductive: The prostate and seminal vesicles are grossly unremarkable. No pelvic mass. Other: None Musculoskeletal: Osteopenia with degenerative changes of the spine and scoliosis. No acute osseous pathology. Review of the MIP images confirms the above findings. IMPRESSION: 1. No aortic dissection or aneurysm. No CT evidence of pulmonary embolism. 2. Findings concerning for acute cholecystitis. Further evaluation with ultrasound recommended. 3. A 14 mm hypodense lesion in the tail of the pancreas, not characterized but may represent a side branch IPMN. Further characterization with MRI without and with contrast on a nonemergent/outpatient basis recommended. 4. Sigmoid diverticulosis. No bowel obstruction. Normal appendix. Electronically Signed   By: Anner Crete M.D.   On: 10/31/2019 23:17      Subjective: - no chest pain, shortness of breath, no abdominal pain, nausea or vomiting.   Discharge Exam: BP (!) 146/73 (BP Location: Left Arm)   Pulse (!) 57   Temp 97.6 F (36.4 C) (Oral)   Resp 15   Ht 6' (1.829 m)   Wt 87.1 kg   SpO2 95%   BMI 26.04 kg/m   General: Pt is alert, awake, not in acute distress Cardiovascular: RRR, S1/S2 +, no rubs, no gallops Respiratory: CTA bilaterally, no wheezing, no rhonchi Abdominal: Soft, NT, ND, bowel sounds + Extremities: no edema, no cyanosis    The results of significant diagnostics from this hospitalization (including imaging, microbiology, ancillary and laboratory) are listed below for reference.     Microbiology: Recent Results (from the past 240 hour(s))  SARS Coronavirus 2 by RT PCR (hospital order, performed  in [facility] hospital lab) Nasopharyngeal Nasopharyngeal Swab     Status: None   Collection Time: 11/01/19  4:17 AM   Specimen: Nasopharyngeal Swab  Result Value Ref Range Status   SARS Coronavirus 2 NEGATIVE NEGATIVE Final    Comment: (NOTE) SARS-CoV-2 target nucleic acids are NOT DETECTED.  The SARS-CoV-2 RNA is generally detectable in upper and lower respiratory specimens during the acute phase of infection. The lowest concentration of SARS-CoV-2 viral copies this assay can detect is 250 copies / mL. A negative result does not preclude SARS-CoV-2 infection and should not be used as the sole basis for treatment or other patient management decisions.  A negative result may occur with improper specimen collection / handling, submission of specimen other than nasopharyngeal swab, presence of viral mutation(s) within the areas targeted by this assay, and inadequate number of viral copies (<250 copies / mL). A negative result must be combined with clinical observations, patient history, and epidemiological information.  Fact Sheet for Patients:   StrictlyIdeas.no  Fact Sheet for Healthcare Providers: BankingDealers.co.za  This test is not yet approved or  cleared by the [place] FDA and has been authorized for detection and/or diagnosis of SARS-CoV-2 by FDA under an Emergency Use Authorization (EUA).  This EUA will remain in effect (meaning this test can be used) for the duration of the COVID-19 declaration under Section 564(b)(1) of the Act, 21 U.S.C. section 360bbb-3(b)(1), unless the authorization is terminated or revoked sooner.  Performed at [facility], [place] [address], [place], [place] [address]      Labs: Basic Metabolic Panel: Recent Labs  Lab 10/31/19 2015 11/01/19 0515  NA 138 138  K 3.7 3.7  CL 105 106  CO2 26 23  GLUCOSE 126* 123*  BUN 18 15  CREATININE 1.01 0.85  CALCIUM 9.0 9.0  MG  --  1.8    Liver Function Tests: Recent Labs  Lab 10/31/19 2120 11/01/19 0515  AST 103* 65*  ALT 63* 64*  ALKPHOS 79 73  BILITOT 0.7 0.9  PROT 5.9* 6.3*  ALBUMIN 3.7 3.7   CBC: Recent Labs  Lab 10/31/19 2015 11/01/19 0515  WBC 6.6 9.2  NEUTROABS  --  7.3  HGB 14.9 14.3  HCT 44.3 42.3  MCV 86.2 84.9  PLT 173 158   CBG: No results for input(s): GLUCAP in the last 168 hours. Hgb A1c No results for input(s): HGBA1C in the last 72 hours. Lipid Profile No results for input(s): CHOL, HDL, LDLCALC, TRIG, CHOLHDL, LDLDIRECT in the last 72 hours. Thyroid function studies No results for input(s): TSH, T4TOTAL, T3FREE, THYROIDAB in the last 72 hours.  Invalid input(s): FREET3 Urinalysis No results found for: COLORURINE, APPEARANCEUR, LABSPEC, PHURINE, GLUCOSEU, HGBUR, BILIRUBINUR, KETONESUR, PROTEINUR, UROBILINOGEN, NITRITE, LEUKOCYTESUR  FURTHER DISCHARGE INSTRUCTIONS:   Get Medicines reviewed and adjusted: Please take all your medications with you for your next visit with your Primary MD   Laboratory/radiological data: Please request your Primary MD to go over all hospital tests and procedure/radiological results at the follow up, please ask your Primary MD to get all Hospital records sent to his/her office.   In some cases, they will be blood work, cultures and biopsy results pending at the time of your discharge. Please request that your primary care M.D. goes through all the records of your hospital data and follows up on these results.   Also Note the following: If you experience worsening of your admission symptoms, develop shortness of breath, life threatening emergency, suicidal or homicidal thoughts you must seek medical attention immediately by calling 911 or calling your MD immediately  if symptoms less severe.   You must read complete instructions/literature along with all the possible adverse reactions/side effects for all the Medicines you take and that have been  prescribed to you. Take any new Medicines after you have completely understood and accpet all the possible adverse reactions/side effects.    Do not drive when taking Pain medications or sleeping medications (Benzodaizepines)   Do not take more than prescribed Pain, Sleep and Anxiety Medications. It is not advisable to combine anxiety,sleep and pain medications without talking with your primary care practitioner   Special Instructions: If you have smoked or chewed Tobacco  in the last 2 yrs please stop smoking, stop any regular Alcohol  and or any Recreational drug use.   Wear Seat belts while driving.   Please note: You were cared for by a hospitalist during your hospital stay. Once you are discharged, your primary care physician will handle any further medical issues. Please note that NO REFILLS for any discharge medications will be authorized once you are discharged, as it is imperative that you return to your primary care physician (or establish a relationship with a primary care physician if you do not have one) for your post hospital discharge needs so that they can reassess your need for medications and monitor your lab values.  Time coordinating discharge: 45 minutes  SIGNED:  Marzetta Board, MD, PhD 11/01/2019, 5:10 PM

## 2019-11-06 DIAGNOSIS — H401131 Primary open-angle glaucoma, bilateral, mild stage: Secondary | ICD-10-CM | POA: Diagnosis not present

## 2019-11-11 DIAGNOSIS — K862 Cyst of pancreas: Secondary | ICD-10-CM | POA: Diagnosis not present

## 2019-11-11 DIAGNOSIS — R109 Unspecified abdominal pain: Secondary | ICD-10-CM | POA: Diagnosis not present

## 2019-11-11 DIAGNOSIS — Z6826 Body mass index (BMI) 26.0-26.9, adult: Secondary | ICD-10-CM | POA: Diagnosis not present

## 2019-11-11 DIAGNOSIS — R7989 Other specified abnormal findings of blood chemistry: Secondary | ICD-10-CM | POA: Diagnosis not present

## 2019-11-11 DIAGNOSIS — K859 Acute pancreatitis without necrosis or infection, unspecified: Secondary | ICD-10-CM | POA: Diagnosis not present

## 2019-11-14 ENCOUNTER — Other Ambulatory Visit (HOSPITAL_COMMUNITY): Payer: Self-pay | Admitting: Physician Assistant

## 2019-11-14 DIAGNOSIS — R1011 Right upper quadrant pain: Secondary | ICD-10-CM

## 2019-11-20 ENCOUNTER — Encounter (HOSPITAL_COMMUNITY)
Admission: RE | Admit: 2019-11-20 | Discharge: 2019-11-20 | Disposition: A | Discharge status: Home / Self Care | Payer: PPO | Source: Ambulatory Visit | Attending: Physician Assistant | Admitting: Physician Assistant

## 2019-11-20 ENCOUNTER — Other Ambulatory Visit: Payer: Self-pay

## 2019-11-20 DIAGNOSIS — R739 Hyperglycemia, unspecified: Secondary | ICD-10-CM | POA: Diagnosis not present

## 2019-11-20 DIAGNOSIS — R1011 Right upper quadrant pain: Secondary | ICD-10-CM | POA: Insufficient documentation

## 2019-11-20 DIAGNOSIS — I10 Essential (primary) hypertension: Secondary | ICD-10-CM | POA: Diagnosis not present

## 2019-11-20 DIAGNOSIS — E7800 Pure hypercholesterolemia, unspecified: Secondary | ICD-10-CM | POA: Diagnosis not present

## 2019-11-20 MED ORDER — SINCALIDE 5 MCG IJ SOLR
INTRAMUSCULAR | Status: AC
  Administered 2019-11-20: 1.75 ug via INTRAVENOUS
  Filled 2019-11-20: qty 5

## 2019-11-20 MED ORDER — TECHNETIUM TC 99M MEBROFENIN IV KIT
5.0000 | PACK | Freq: Once | INTRAVENOUS | Status: AC | PRN
  Administered 2019-11-20: 5.5 via INTRAVENOUS

## 2019-11-20 MED ORDER — STERILE WATER FOR INJECTION IJ SOLN
INTRAMUSCULAR | Status: AC
  Administered 2019-11-20: 1.75 mL via INTRAVENOUS
  Filled 2019-11-20: qty 10

## 2019-11-24 DIAGNOSIS — R1013 Epigastric pain: Secondary | ICD-10-CM | POA: Diagnosis not present

## 2019-11-24 DIAGNOSIS — R739 Hyperglycemia, unspecified: Secondary | ICD-10-CM | POA: Diagnosis not present

## 2019-11-24 DIAGNOSIS — Z23 Encounter for immunization: Secondary | ICD-10-CM | POA: Diagnosis not present

## 2019-11-24 DIAGNOSIS — R260 Ataxic gait: Secondary | ICD-10-CM | POA: Diagnosis not present

## 2019-11-24 DIAGNOSIS — H4053X1 Glaucoma secondary to other eye disorders, bilateral, mild stage: Secondary | ICD-10-CM | POA: Diagnosis not present

## 2019-11-24 DIAGNOSIS — G629 Polyneuropathy, unspecified: Secondary | ICD-10-CM | POA: Diagnosis not present

## 2019-11-24 DIAGNOSIS — Z6826 Body mass index (BMI) 26.0-26.9, adult: Secondary | ICD-10-CM | POA: Diagnosis not present

## 2019-11-24 DIAGNOSIS — E7800 Pure hypercholesterolemia, unspecified: Secondary | ICD-10-CM | POA: Diagnosis not present

## 2019-12-01 ENCOUNTER — Encounter: Payer: Self-pay | Admitting: Internal Medicine

## 2019-12-01 DIAGNOSIS — K862 Cyst of pancreas: Secondary | ICD-10-CM | POA: Diagnosis not present

## 2019-12-01 DIAGNOSIS — R109 Unspecified abdominal pain: Secondary | ICD-10-CM | POA: Diagnosis not present

## 2019-12-01 DIAGNOSIS — Z6826 Body mass index (BMI) 26.0-26.9, adult: Secondary | ICD-10-CM | POA: Diagnosis not present

## 2019-12-01 DIAGNOSIS — K859 Acute pancreatitis without necrosis or infection, unspecified: Secondary | ICD-10-CM | POA: Diagnosis not present

## 2019-12-01 DIAGNOSIS — R7989 Other specified abnormal findings of blood chemistry: Secondary | ICD-10-CM | POA: Diagnosis not present

## 2019-12-17 ENCOUNTER — Telehealth: Payer: Self-pay | Admitting: Orthopedic Surgery

## 2019-12-17 NOTE — Telephone Encounter (Signed)
Roberto Rosales called and stated that he wanted to make an appointment for knee pain.  He said he slipped down the steps "a couple of years ago" and injured his knee.  He said he went to [facility] at that time for treatment.  I asked him if he knew how many years ago this happened.  He said it was around 2 years ago.  I told him that we would have to get those notes and x-rays for Dr. Aline Brochure to review before scheduling an appointment.  He said he would check in to getting those and call us back.

## 2019-12-19 DIAGNOSIS — M25561 Pain in right knee: Secondary | ICD-10-CM | POA: Diagnosis not present

## 2019-12-19 DIAGNOSIS — M1711 Unilateral primary osteoarthritis, right knee: Secondary | ICD-10-CM | POA: Diagnosis not present

## 2020-01-27 NOTE — Progress Notes (Signed)
Referring Provider: [facility] Physician:  [facility] Gastroenterologist:  Dr. Abbey Chatters  Chief Complaint  Patient presents with  . positive hemosure    1 year ago. Last TCS 10-12 years ago    HPI:   Roberto Rosales is a 74 y.o. male presenting today at the request of [facility] for positive hemosure.  Patient completed and a nurse visit triage in September 2020.  He reported last colonoscopy was 12 years ago done by Dr. [name], no polyps per pt. Ultimately, patient stated he did not want to have Covid screening due to history of multiple nose surgeries.  As there was no other way to screen for Covid, he requested to cancel scheduling the colonoscopy.  Today: Had positive hemosure about 1 year ago. Thinks this was secondary to nose bleeds. Last TCS was 10-12 years ago at [facility]. No polyps. No FHx of colon cancer or polyps.   No blood in the stool or back  stools. BMs daily to twice daily. No abdominal pain. No nasuea or vomiting.  No GERD symptoms or dysaphia.   Had central epigastric pain back in June for which he presented to the emergency room and was subsequently admitted to the hospital.  After 2 hours, the pain resolved. This was the first and the last time he has ever had epigastric pain.  Reviewed hospital records.  Patient was admitted 6/25-6/26/2021 at [facility] after presenting with epigastric abdominal pain and midsternal pain. CTA was performed to rule out dissection.  No dissection was noted but there are findings concerning for possible acute cholecystitis and an incidental 14 mm lesion in the tail of the pancreas. Patient's lipase was also elevated at 610 suggestive of pancreatitis although CT did not suggest this.  LFTs also noted to be elevated with AST 103, ALT 63.  RUQ ultrasound found no sonographic features of cholecystitis and liver appeared normal.    General surgery had  recommended HIDA which was not able to be obtained over the weekend due to shortage of radiotracer. Subsequently underwent MRI/MRCP which did not show any gallbladder/biliary pathology, no evidence of acute pancreatitis, no solid pancreatic mass, simple appearing cystic lesion in the pancreatic tail measuring 1.5 x 1.4 cm with no evidence of pancreatic ductal dilation or pancreatic divisum.  He received IV fluids, GI cocktail, and had improvement in symptoms.  He was discharged on a PPI.  Labs on day of discharge with AST down to 65, ALT 64, lipase 80.  HIDA completed 11/20/2019 was within normal limits.  Repeat labs reviewed in care everywhere completed 11/21/2019: AST 24, ALT 23, alk phos 80, total bilirubin 0.4, triglycerides 91.  Regarding pancreatic lesion, patient denies weight loss, any further abdominal pain, jaundice, prior episodes of pancreatitis, or family history of pancreatic cancer.  We discussed the possibility of following him along for this with the possibility of ERCP if needed.  Patient states he prefers to continue following with his PCP for this matter.  States PCP is arranging follow-up MRI.  Past Medical History:  Diagnosis Date  . Epistaxis   . Glaucoma   . High cholesterol   . Neuropathy     Past Surgical History:  Procedure Laterality Date  . BACK SURGERY    . CATARACT EXTRACTION Bilateral 2007  . COLONOSCOPY     Dr. [name], no polyps per pt  . NASAL ENDOSCOPY WITH EPISTAXIS CONTROL Right 10/11/2017   Procedure: NASAL CAUTERY AND  BIOPSY OF RIGHT NASAL GRANULATION TISSUE;  Surgeon: [name], MD;  Location: [place];  Service: ENT;  Laterality: Right;  . NASAL ENDOSCOPY WITH EPISTAXIS CONTROL Right 10/22/2017   Procedure: NASAL ENDOSCOPY WITH RIGHT EPISTAXIS CONTROL;  Surgeon: [name], MD;  Location: [place];  Service: ENT;  Laterality: Right;  . TONSILLECTOMY  1950    Current Outpatient Medications  Medication Sig Dispense Refill  . atorvastatin  (LIPITOR) 10 MG tablet Take 5 mg by mouth daily.     . dorzolamide-timolol (COSOPT) 22.3-6.8 MG/ML ophthalmic solution Place 1 drop into the left eye 2 (two) times daily.    Marland Kitchen latanoprost (XALATAN) 0.005 % ophthalmic solution Place 1 drop into both eyes at bedtime.     . Multiple Vitamin (MULTIVITAMIN) tablet Take 1 tablet by mouth daily.     No current facility-administered medications for this visit.    Allergies as of 01/28/2020 - Review Complete 01/28/2020  Allergen Reaction Noted  . Codeine Nausea Only 09/21/2015    Family History  Problem Relation Age of Onset  . Cerebral aneurysm Mother   . Heart disease Father   . Stroke Father   . Neuropathy Neg Hx   . Colon cancer Neg Hx     Social History   Socioeconomic History  . Marital status: Married    Spouse name: Not on file  . Number of children: 2  . Years of education: 6  . Highest education level: Not on file  Occupational History  . Occupation: Retired  Tobacco Use  . Smoking status: Never Smoker  . Smokeless tobacco: Never Used  Vaping Use  . Vaping Use: Never used  Substance and Sexual Activity  . Alcohol use: No  . Drug use: No  . Sexual activity: Not on file  Other Topics Concern  . Not on file  Social History Narrative   Lives at home w/ his wife   Right-handed   Caffeine: 1 - 1 1/2 cups coffee per day   Social Determinants of Health   Financial Resource Strain:   . Difficulty of Paying Living Expenses: Not on file  Food Insecurity:   . Worried About Charity fundraiser in the Last Year: Not on file  . Ran Out of Food in the Last Year: Not on file  Transportation Needs:   . Lack of Transportation (Medical): Not on file  . Lack of Transportation (Non-Medical): Not on file  Physical Activity:   . Days of Exercise per Week: Not on file  . Minutes of Exercise per Session: Not on file  Stress:   . Feeling of Stress : Not on file  Social Connections:   . Frequency of Communication with Friends  and Family: Not on file  . Frequency of Social Gatherings with Friends and Family: Not on file  . Attends Religious Services: Not on file  . Active Member of Clubs or Organizations: Not on file  . Attends Archivist Meetings: Not on file  . Marital Status: Not on file  Intimate Partner Violence:   . Fear of Current or Ex-Partner: Not on file  . Emotionally Abused: Not on file  . Physically Abused: Not on file  . Sexually Abused: Not on file    Review of Systems: Gen: Denies any fever, chills, cold or flulike symptoms, presyncope, syncope. CV: Denies chest pain or heart palpitations. Resp: Denies shortness of breath or cough. GI: See HPI. GU : Denies urinary burning, urinary frequency, urinary  hesitancy Psych: Denies depression or anxiety Heme: See HPI  Physical Exam: BP (!) 162/75   Pulse 63   Temp (!) 97.1 F (36.2 C) (Oral)   Ht 6' (1.829 m)   Wt 194 lb (88 kg)   BMI 26.31 kg/m  General:   Alert and oriented. Pleasant and cooperative. Well-nourished and well-developed.  Head:  Normocephalic and atraumatic. Eyes:  Without icterus, sclera clear and conjunctiva pink.  Ears:  Normal auditory acuity. Lungs: Clear to auscultation bilaterally. No wheezes, rales, or rhonchi. No distress.  Heart:  S1, S2 present without murmurs appreciated.  Abdomen:  +BS, soft, non-tender and non-distended. No HSM noted. No guarding or rebound. No masses appreciated.  Rectal:  Deferred  Msk:  Symmetrical without gross deformities. Normal posture. Extremities:  Without edema.  Neurologic: Alert and  oriented x4;  grossly normal neurologically. Skin:  Intact without significant lesions or rashes. Psych: Normal mood and affect.

## 2020-01-27 NOTE — H&P (View-Only) (Signed)
Referring Provider: [facility] Physician:  [facility] Gastroenterologist:  Dr. Abbey Chatters  Chief Complaint  Patient presents with  . positive hemosure    1 year ago. Last TCS 10-12 years ago    HPI:   Roberto Rosales is a 74 y.o. male presenting today at the [facility] for positive hemosure.  Patient completed and a nurse visit triage in September 2020.  He reported last colonoscopy was 12 years ago done by Dr. [name], no polyps per pt. Ultimately, patient stated he did not want to have Covid screening due to history of multiple nose surgeries.  As there was no other way to screen for Covid, he requested to cancel scheduling the colonoscopy.  Today: Had positive hemosure about 1 year ago. Thinks this was secondary to nose bleeds. Last TCS was 10-12 years ago at [facility]. No polyps. No FHx of colon cancer or polyps.   No blood in the stool or back  stools. BMs daily to twice daily. No abdominal pain. No nasuea or vomiting.  No GERD symptoms or dysaphia.   Had central epigastric pain back in June for which he presented to the emergency room and was subsequently admitted to the hospital.  After 2 hours, the pain resolved. This was the first and the last time he has ever had epigastric pain.  Reviewed hospital records.  Patient was admitted 6/25-6/26/2021 at [facility] after presenting with epigastric abdominal pain and midsternal pain. CTA was performed to rule out dissection.  No dissection was noted but there are findings concerning for possible acute cholecystitis and an incidental 14 mm lesion in the tail of the pancreas. Patient's lipase was also elevated at 610 suggestive of pancreatitis although CT did not suggest this.  LFTs also noted to be elevated with AST 103, ALT 63.  RUQ ultrasound found no sonographic features of cholecystitis and liver appeared normal.    General surgery had  recommended HIDA which was not able to be obtained over the weekend due to shortage of radiotracer. Subsequently underwent MRI/MRCP which did not show any gallbladder/biliary pathology, no evidence of acute pancreatitis, no solid pancreatic mass, simple appearing cystic lesion in the pancreatic tail measuring 1.5 x 1.4 cm with no evidence of pancreatic ductal dilation or pancreatic divisum.  He received IV fluids, GI cocktail, and had improvement in symptoms.  He was discharged on a PPI.  Labs on day of discharge with AST down to 65, ALT 64, lipase 80.  HIDA completed 11/20/2019 was within normal limits.  Repeat labs reviewed in care everywhere completed 11/21/2019: AST 24, ALT 23, alk phos 80, total bilirubin 0.4, triglycerides 91.  Regarding pancreatic lesion, patient denies weight loss, any further abdominal pain, jaundice, prior episodes of pancreatitis, or family history of pancreatic cancer.  We discussed the possibility of following him along for this with the possibility of ERCP if needed.  Patient states he prefers to continue following with his PCP for this matter.  States PCP is arranging follow-up MRI.  Past Medical History:  Diagnosis Date  . Epistaxis   . Glaucoma   . High cholesterol   . Neuropathy     Past Surgical History:  Procedure Laterality Date  . BACK SURGERY    . CATARACT EXTRACTION Bilateral 2007  . COLONOSCOPY     Dr. [name], no polyps per pt  . NASAL ENDOSCOPY WITH EPISTAXIS CONTROL Right 10/11/2017   Procedure: NASAL CAUTERY AND  BIOPSY OF RIGHT NASAL GRANULATION TISSUE;  Surgeon: [name], MD;  Location: [place];  Service: ENT;  Laterality: Right;  . NASAL ENDOSCOPY WITH EPISTAXIS CONTROL Right 10/22/2017   Procedure: NASAL ENDOSCOPY WITH RIGHT EPISTAXIS CONTROL;  Surgeon: [name], MD;  Location: [place];  Service: ENT;  Laterality: Right;  . TONSILLECTOMY  1950    Current Outpatient Medications  Medication Sig Dispense Refill  . atorvastatin  (LIPITOR) 10 MG tablet Take 5 mg by mouth daily.     . dorzolamide-timolol (COSOPT) 22.3-6.8 MG/ML ophthalmic solution Place 1 drop into the left eye 2 (two) times daily.    Marland Kitchen latanoprost (XALATAN) 0.005 % ophthalmic solution Place 1 drop into both eyes at bedtime.     . Multiple Vitamin (MULTIVITAMIN) tablet Take 1 tablet by mouth daily.     No current facility-administered medications for this visit.    Allergies as of 01/28/2020 - Review Complete 01/28/2020  Allergen Reaction Noted  . Codeine Nausea Only 09/21/2015    Family History  Problem Relation Age of Onset  . Cerebral aneurysm Mother   . Heart disease Father   . Stroke Father   . Neuropathy Neg Hx   . Colon cancer Neg Hx     Social History   Socioeconomic History  . Marital status: Married    Spouse name: Not on file  . Number of children: 2  . Years of education: 6  . Highest education level: Not on file  Occupational History  . Occupation: Retired  Tobacco Use  . Smoking status: Never Smoker  . Smokeless tobacco: Never Used  Vaping Use  . Vaping Use: Never used  Substance and Sexual Activity  . Alcohol use: No  . Drug use: No  . Sexual activity: Not on file  Other Topics Concern  . Not on file  Social History Narrative   Lives at home w/ his wife   Right-handed   Caffeine: 1 - 1 1/2 cups coffee per day   Social Determinants of Health   Financial Resource Strain:   . Difficulty of Paying Living Expenses: Not on file  Food Insecurity:   . Worried About Charity fundraiser in the Last Year: Not on file  . Ran Out of Food in the Last Year: Not on file  Transportation Needs:   . Lack of Transportation (Medical): Not on file  . Lack of Transportation (Non-Medical): Not on file  Physical Activity:   . Days of Exercise per Week: Not on file  . Minutes of Exercise per Session: Not on file  Stress:   . Feeling of Stress : Not on file  Social Connections:   . Frequency of Communication with Friends  and Family: Not on file  . Frequency of Social Gatherings with Friends and Family: Not on file  . Attends Religious Services: Not on file  . Active Member of Clubs or Organizations: Not on file  . Attends Archivist Meetings: Not on file  . Marital Status: Not on file  Intimate Partner Violence:   . Fear of Current or Ex-Partner: Not on file  . Emotionally Abused: Not on file  . Physically Abused: Not on file  . Sexually Abused: Not on file    Review of Systems: Gen: Denies any fever, chills, cold or flulike symptoms, presyncope, syncope. CV: Denies chest pain or heart palpitations. Resp: Denies shortness of breath or cough. GI: See HPI. GU : Denies urinary burning, urinary frequency, urinary  hesitancy Psych: Denies depression or anxiety Heme: See HPI  Physical Exam: BP (!) 162/75   Pulse 63   Temp (!) 97.1 F (36.2 C) (Oral)   Ht 6' (1.829 m)   Wt 194 lb (88 kg)   BMI 26.31 kg/m  General:   Alert and oriented. Pleasant and cooperative. Well-nourished and well-developed.  Head:  Normocephalic and atraumatic. Eyes:  Without icterus, sclera clear and conjunctiva pink.  Ears:  Normal auditory acuity. Lungs: Clear to auscultation bilaterally. No wheezes, rales, or rhonchi. No distress.  Heart:  S1, S2 present without murmurs appreciated.  Abdomen:  +BS, soft, non-tender and non-distended. No HSM noted. No guarding or rebound. No masses appreciated.  Rectal:  Deferred  Msk:  Symmetrical without gross deformities. Normal posture. Extremities:  Without edema.  Neurologic: Alert and  oriented x4;  grossly normal neurologically. Skin:  Intact without significant lesions or rashes. Psych: Normal mood and affect.

## 2020-01-28 ENCOUNTER — Other Ambulatory Visit: Payer: Self-pay

## 2020-01-28 ENCOUNTER — Ambulatory Visit: Payer: PPO | Admitting: Gastroenterology

## 2020-01-28 ENCOUNTER — Encounter: Payer: Self-pay | Admitting: Gastroenterology

## 2020-01-28 VITALS — BP 162/75 | HR 63 | Temp 97.1°F | Ht 72.0 in | Wt 194.0 lb

## 2020-01-28 DIAGNOSIS — K869 Disease of pancreas, unspecified: Secondary | ICD-10-CM | POA: Diagnosis not present

## 2020-01-28 DIAGNOSIS — R195 Other fecal abnormalities: Secondary | ICD-10-CM | POA: Diagnosis not present

## 2020-01-28 NOTE — Patient Instructions (Signed)
We will get you scheduled for colonoscopy in the near future with Dr. Abbey Chatters.  We will see you back as needed.  Do not hesitate to call if you have questions or concerns.  Aliene Altes, PA-C [facility]

## 2020-01-29 ENCOUNTER — Telehealth: Payer: Self-pay | Admitting: *Deleted

## 2020-01-29 NOTE — Telephone Encounter (Signed)
Patient returned call. He has been scheduled for 10/18 at 7:30am. Aware will mail prep instructions with covid test appt. Confirmed mailingaddress.

## 2020-01-29 NOTE — Telephone Encounter (Signed)
LMOVM to schedule TCS with Dr. Abbey Chatters, asa 2

## 2020-02-01 ENCOUNTER — Telehealth: Payer: Self-pay | Admitting: Gastroenterology

## 2020-02-01 ENCOUNTER — Encounter: Payer: Self-pay | Admitting: Gastroenterology

## 2020-02-01 NOTE — Assessment & Plan Note (Addendum)
74 year old male who had positive hemosure with PCP 1 year ago with no follow-up colonoscopy.  Patient reports his last colonoscopy 10-12 years ago with Dr. [name] with no colon polyps.  He has no significant upper or lower GI symptoms at this time.  No alarm symptoms.  Plan:  Proceed with colonoscopy with propofol with Dr. Abbey Chatters in the near future. The risks, benefits, and alternatives have been discussed with the patient in detail. The patient states understanding and desires to proceed.  ASA II Follow-up as needed.

## 2020-02-01 NOTE — Telephone Encounter (Signed)
Received and reviewed prior colonoscopy records dated 11/04/2007 completed with Dr. Benson Norway.  Indication: Screening Impression: Normal rectum, enlarged prostate, no diverticulosis, no colonic or rectal polyps, normal colon otherwise. Repeat exam: Repeat colonoscopy in 10 years.  No additional recommendations.  Will have report scanned into patient's chart.

## 2020-02-01 NOTE — Assessment & Plan Note (Signed)
Incidental 14 mm lesion in the tail of the pancreas noted on CTA in June 2021.  MRI/MRCP with no solid pancreatic mass, simple appearing cystic lesion in the pancreatic tail measuring 1.5 x 1.4 cm with no evidence of pancreatic ductal dilation or pancreatic divisum.  Recommend repeat MRI with and without contrast in 6 months.  Offered to follow patient for this.  Patient prefers to follow with his PCP on this matter.  PCP is arranging follow-up MRI.

## 2020-02-08 ENCOUNTER — Telehealth: Payer: Self-pay | Admitting: Gastroenterology

## 2020-02-08 NOTE — Telephone Encounter (Signed)
Received hemosure test completed 11/25/2018 with PCP.  Result was positive.  We will proceed with colonoscopy as planned.  No additional recommendations.  Will have results scanned into patient's chart.

## 2020-02-20 ENCOUNTER — Other Ambulatory Visit: Payer: Self-pay

## 2020-02-20 ENCOUNTER — Other Ambulatory Visit (HOSPITAL_COMMUNITY)
Admission: RE | Admit: 2020-02-20 | Discharge: 2020-02-20 | Disposition: A | Discharge status: Home / Self Care | Payer: PPO | Source: Ambulatory Visit | Attending: Internal Medicine | Admitting: Internal Medicine

## 2020-02-20 DIAGNOSIS — Z20822 Contact with and (suspected) exposure to covid-19: Secondary | ICD-10-CM | POA: Diagnosis not present

## 2020-02-20 DIAGNOSIS — Z01812 Encounter for preprocedural laboratory examination: Secondary | ICD-10-CM | POA: Insufficient documentation

## 2020-02-21 LAB — SARS CORONAVIRUS 2 (TAT 6-24 HRS): SARS Coronavirus 2: NEGATIVE

## 2020-02-23 ENCOUNTER — Other Ambulatory Visit: Payer: Self-pay

## 2020-02-23 ENCOUNTER — Encounter (HOSPITAL_COMMUNITY): Payer: Self-pay

## 2020-02-23 ENCOUNTER — Ambulatory Visit (HOSPITAL_COMMUNITY): Payer: PPO | Admitting: Anesthesiology

## 2020-02-23 ENCOUNTER — Encounter (HOSPITAL_COMMUNITY)
Admission: RE | Disposition: A | Discharge status: Home / Self Care | Payer: Self-pay | Source: Home / Self Care | Attending: Internal Medicine

## 2020-02-23 ENCOUNTER — Ambulatory Visit (HOSPITAL_COMMUNITY)
Admission: RE | Admit: 2020-02-23 | Discharge: 2020-02-23 | Disposition: A | Discharge status: Home / Self Care | Payer: PPO | Attending: Internal Medicine | Admitting: Internal Medicine

## 2020-02-23 DIAGNOSIS — R195 Other fecal abnormalities: Secondary | ICD-10-CM

## 2020-02-23 DIAGNOSIS — H409 Unspecified glaucoma: Secondary | ICD-10-CM | POA: Insufficient documentation

## 2020-02-23 DIAGNOSIS — K5730 Diverticulosis of large intestine without perforation or abscess without bleeding: Secondary | ICD-10-CM | POA: Diagnosis not present

## 2020-02-23 DIAGNOSIS — Z79899 Other long term (current) drug therapy: Secondary | ICD-10-CM | POA: Diagnosis not present

## 2020-02-23 DIAGNOSIS — K635 Polyp of colon: Secondary | ICD-10-CM | POA: Diagnosis not present

## 2020-02-23 DIAGNOSIS — K648 Other hemorrhoids: Secondary | ICD-10-CM | POA: Diagnosis not present

## 2020-02-23 DIAGNOSIS — E7800 Pure hypercholesterolemia, unspecified: Secondary | ICD-10-CM | POA: Insufficient documentation

## 2020-02-23 DIAGNOSIS — D122 Benign neoplasm of ascending colon: Secondary | ICD-10-CM | POA: Insufficient documentation

## 2020-02-23 HISTORY — PX: POLYPECTOMY: SHX5525

## 2020-02-23 HISTORY — PX: COLONOSCOPY WITH PROPOFOL: SHX5780

## 2020-02-23 SURGERY — COLONOSCOPY WITH PROPOFOL
Anesthesia: General

## 2020-02-23 MED ORDER — PROPOFOL 500 MG/50ML IV EMUL
INTRAVENOUS | Status: DC | PRN
  Administered 2020-02-23: 150 ug/kg/min via INTRAVENOUS

## 2020-02-23 MED ORDER — STERILE WATER FOR IRRIGATION IR SOLN
Status: DC | PRN
  Administered 2020-02-23: 100 mL

## 2020-02-23 MED ORDER — CHLORHEXIDINE GLUCONATE CLOTH 2 % EX PADS: 6.0000 | MEDICATED_PAD | Freq: Once | CUTANEOUS | Status: DC

## 2020-02-23 MED ORDER — PROPOFOL 10 MG/ML IV BOLUS
INTRAVENOUS | Status: AC
  Filled 2020-02-23: qty 120

## 2020-02-23 MED ORDER — PROPOFOL 10 MG/ML IV BOLUS
INTRAVENOUS | Status: DC | PRN
  Administered 2020-02-23: 20 mg via INTRAVENOUS
  Administered 2020-02-23: 50 mg via INTRAVENOUS
  Administered 2020-02-23: 20 mg via INTRAVENOUS

## 2020-02-23 MED ORDER — LACTATED RINGERS IV SOLN: INTRAVENOUS | Status: DC

## 2020-02-23 NOTE — Anesthesia Postprocedure Evaluation (Signed)
Anesthesia Post Note  Patient: LEVEON PELZER  Procedure(s) Performed: COLONOSCOPY WITH PROPOFOL (N/A ) POLYPECTOMY  Patient location during evaluation: PACU Anesthesia Type: General Level of consciousness: awake and alert and oriented Pain management: pain level controlled Vital Signs Assessment: post-procedure vital signs reviewed and stable Respiratory status: spontaneous breathing Cardiovascular status: blood pressure returned to baseline and stable Postop Assessment: no apparent nausea or vomiting Anesthetic complications: no   No complications documented.   Last Vitals:  Vitals:   02/23/20 0650 02/23/20 0817  BP: 139/72 117/63  Pulse: (!) 54   Resp: 12 13  Temp: 36.6 C 36.4 C  SpO2: 98% 97%    Last Pain:  Vitals:   02/23/20 0817  TempSrc: Oral  PainSc: 0-No pain                 Alyzza Andringa

## 2020-02-23 NOTE — Transfer of Care (Signed)
Immediate Anesthesia Transfer of Care Note  Patient: Roberto Rosales  Procedure(s) Performed: COLONOSCOPY WITH PROPOFOL (N/A ) POLYPECTOMY  Patient Location: PACU  Anesthesia Type:General  Level of Consciousness: awake  Airway & Oxygen Therapy: Patient Spontanous Breathing  Post-op Assessment: Report given to RN  Post vital signs: Reviewed  Last Vitals:  Vitals Value Taken Time  BP 117/63 02/23/20 0817  Temp 36.4 C 02/23/20 0817  Pulse    Resp 13 02/23/20 0817  SpO2 97 % 02/23/20 0817    Last Pain:  Vitals:   02/23/20 0817  TempSrc: Oral  PainSc: 0-No pain      Patients Stated Pain Goal: 5 (58/44/17 1278)  Complications: No complications documented.

## 2020-02-23 NOTE — Op Note (Signed)
[facility] Patient Name: Roberto Rosales Procedure Date: 02/23/2020 7:45 AM MRN: [id number] Date of Birth: May 03, 1946 Attending MD: Elon Alas. Abbey Chatters DO CSN: [id number] Age: 74 Admit Type: Outpatient Procedure:                Colonoscopy Indications:              Heme positive stool Providers:                Elon Alas. Abbey Chatters, DO, Crystal Page, Wynonia Musty Tech, Technician Referring MD:              Medicines:                See the Anesthesia note for documentation of the                            administered medications Complications:            No immediate complications. Estimated Blood Loss:     Estimated blood loss was minimal. Procedure:                Pre-Anesthesia Assessment:                           - The anesthesia plan was to use monitored                            anesthesia care (MAC).                           After obtaining informed consent, the colonoscope                            was passed under direct vision. Throughout the                            procedure, the patient's blood pressure, pulse, and                            oxygen saturations were monitored continuously. The                            [id number]-HQ190L ([id number]) scope was introduced through                            the anus and advanced to the the cecum, identified                            by appendiceal orifice and ileocecal valve. The                            colonoscopy was performed without difficulty. The                            patient tolerated the procedure well. The quality  of the bowel preparation was evaluated using the                            BBPS [facility] Bowel Preparation Scale) with scores                            of: Right Colon = 2 (minor amount of residual                            staining, small fragments of stool and/or opaque                            liquid, but mucosa seen well), Transverse  Colon = 3                            (entire mucosa seen well with no residual staining,                            small fragments of stool or opaque liquid) and Left                            Colon = 3 (entire mucosa seen well with no residual                            staining, small fragments of stool or opaque                            liquid). The total BBPS score equals 8. The quality                            of the bowel preparation was good. Scope In: 7:54:22 AM Scope Out: 8:11:53 AM Scope Withdrawal Time: 0 hours 11 minutes 51 seconds  Total Procedure Duration: 0 hours 17 minutes 31 seconds  Findings:      The perianal and digital rectal examinations were normal.      Non-bleeding internal hemorrhoids were found during endoscopy.      Scattered small-mouthed diverticula were found in the entire colon.      Three sessile polyps were found in the ascending colon. The polyps were       5 to 8 mm in size. These polyps were removed with a cold snare.       Resection and retrieval were complete. Impression:               - Non-bleeding internal hemorrhoids.                           - Diverticulosis in the entire examined colon.                           - Three 5 to 8 mm polyps in the ascending colon,                            removed with a cold snare. Resected and retrieved.  Moderate Sedation:      Per Anesthesia Care Recommendation:           - Patient has a contact number available for                            emergencies. The signs and symptoms of potential                            delayed complications were discussed with the                            patient. Return to normal activities tomorrow.                            Written discharge instructions were provided to the                            patient.                           - Resume previous diet.                           - Continue present medications.                           - Await pathology  results.                           - Repeat colonoscopy in 5 years for surveillance.                           - Return to GI clinic PRN. Procedure Code(s):        --- Professional ---                           [phone], Colonoscopy, flexible; with removal of                            tumor(s), polyp(s), or other lesion(s) by snare                            technique Diagnosis Code(s):        --- Professional ---                           K64.8, Other hemorrhoids                           K63.5, Polyp of colon                           R19.5, Other fecal abnormalities                           K57.30, Diverticulosis of large intestine without  perforation or abscess without bleeding CPT copyright 2019 American Medical Association. All rights reserved. The codes documented in this report are preliminary and upon coder review may  be revised to meet current compliance requirements. Elon Alas. Abbey Chatters, DO [place] Abbey Chatters, DO 02/23/2020 8:17:47 AM This report has been signed electronically. Number of Addenda: 0

## 2020-02-23 NOTE — Interval H&P Note (Signed)
History and Physical Interval Note:  02/23/2020 7:47 AM  Roberto Rosales  has presented today for surgery, with the diagnosis of heme positive stool.  The various methods of treatment have been discussed with the patient and family. After consideration of risks, benefits and other options for treatment, the patient has consented to  Procedure(s) with comments: COLONOSCOPY WITH PROPOFOL (N/A) - 7:30am as a surgical intervention.  The patient's history has been reviewed, patient examined, no change in status, stable for surgery.  I have reviewed the patient's chart and labs.  Questions were answered to the patient's satisfaction.     [name]

## 2020-02-23 NOTE — Anesthesia Preprocedure Evaluation (Signed)
Anesthesia Evaluation  Patient identified by MRN, date of birth, ID band Patient awake    Reviewed: Allergy & Precautions, H&P , NPO status , Patient's Chart, lab work & pertinent test results, reviewed documented beta blocker date and time   Airway Mallampati: II  TM Distance: >3 FB Neck ROM: full    Dental no notable dental hx.    Pulmonary neg pulmonary ROS,    Pulmonary exam normal breath sounds clear to auscultation       Cardiovascular Exercise Tolerance: Good negative cardio ROS   Rhythm:regular Rate:Normal     Neuro/Psych negative neurological ROS  negative psych ROS   GI/Hepatic negative GI ROS, Neg liver ROS,   Endo/Other  negative endocrine ROS  Renal/GU negative Renal ROS  negative genitourinary   Musculoskeletal   Abdominal   Peds  Hematology negative hematology ROS (+)   Anesthesia Other Findings   Reproductive/Obstetrics negative OB ROS                             Anesthesia Physical Anesthesia Plan  ASA: II  Anesthesia Plan: General   Post-op Pain Management:    Induction:   PONV Risk Score and Plan: Propofol infusion  Airway Management Planned:   Additional Equipment:   Intra-op Plan:   Post-operative Plan:   Informed Consent: I have reviewed the patients History and Physical, chart, labs and discussed the procedure including the risks, benefits and alternatives for the proposed anesthesia with the patient or authorized representative who has indicated his/her understanding and acceptance.     Dental Advisory Given  Plan Discussed with: CRNA  Anesthesia Plan Comments:         Anesthesia Quick Evaluation  

## 2020-02-23 NOTE — Discharge Instructions (Addendum)
Colonoscopy Discharge Instructions  Read the instructions outlined below and refer to this sheet in the next few weeks. These discharge instructions provide you with general information on caring for yourself after you leave the hospital. Your doctor may also give you specific instructions. While your treatment has been planned according to the most current medical practices available, unavoidable complications occasionally occur.   ACTIVITY  You may resume your regular activity, but move at a slower pace for the next 24 hours.   Take frequent rest periods for the next 24 hours.   Walking will help get rid of the air and reduce the bloated feeling in your belly (abdomen).   No driving for 24 hours (because of the medicine (anesthesia) used during the test).    Do not sign any important legal documents or operate any machinery for 24 hours (because of the anesthesia used during the test).  NUTRITION  Drink plenty of fluids.   You may resume your normal diet as instructed by your doctor.   Begin with a light meal and progress to your normal diet. Heavy or fried foods are harder to digest and may make you feel sick to your stomach (nauseated).   Avoid alcoholic beverages for 24 hours or as instructed.  MEDICATIONS  You may resume your normal medications unless your doctor tells you otherwise.  WHAT YOU CAN EXPECT TODAY  Some feelings of bloating in the abdomen.   Passage of more gas than usual.   Spotting of blood in your stool or on the toilet paper.  IF YOU HAD POLYPS REMOVED DURING THE COLONOSCOPY:  No aspirin products for 7 days or as instructed.   No alcohol for 7 days or as instructed.   Eat a soft diet for the next 24 hours.  FINDING OUT THE RESULTS OF YOUR TEST Not all test results are available during your visit. If your test results are not back during the visit, make an appointment with your caregiver to find out the results. Do not assume everything is normal if  you have not heard from your caregiver or the medical facility. It is important for you to follow up on all of your test results.  SEEK IMMEDIATE MEDICAL ATTENTION IF:  You have more than a spotting of blood in your stool.   Your belly is swollen (abdominal distention).   You are nauseated or vomiting.   You have a temperature over 101.   You have abdominal pain or discomfort that is severe or gets worse throughout the day.   Your colonoscopy revealed 3 polyps which I removed successfully.  Await pathology results, my office will contact you.  I recommend we repeat colonoscopy in 5 years for surveillance purposes.  You also have diverticulosis and internal hemorrhoids.  I recommend that you increase fiber in your diet or add Benefiber/Metamucil over-the-counter to reduce risk of complications such as diverticulitis or diverticular bleeding.  Be sure to drink at least 4 to 6 glasses of water daily.  Follow-up with GI as needed.  I hope you have a great rest of your week!  Elon Alas. Abbey Chatters, D.O. Gastroenterology and Hepatology [facility]   Diverticulosis  Diverticulosis is a condition that develops when small pouches (diverticula) form in the wall of the large intestine (colon). The colon is where water is absorbed and stool (feces) is formed. The pouches form when the inside layer of the colon pushes through weak spots in the outer layers of the colon. You may  have a few pouches or many of them. The pouches usually do not cause problems unless they become inflamed or infected. When this happens, the condition is called diverticulitis. What are the causes? The cause of this condition is not known. What increases the risk? The following factors may make you more likely to develop this condition:  Being older than age 34. Your risk for this condition increases with age. Diverticulosis is rare among people younger than age 52. By age 70, many people have  it.  Eating a low-fiber diet.  Having frequent constipation.  Being overweight.  Not getting enough exercise.  Smoking.  Taking over-the-counter pain medicines, like aspirin and ibuprofen.  Having a family history of diverticulosis. What are the signs or symptoms? In most people, there are no symptoms of this condition. If you do have symptoms, they may include:  Bloating.  Cramps in the abdomen.  Constipation or diarrhea.  Pain in the lower left side of the abdomen. How is this diagnosed? Because diverticulosis usually has no symptoms, it is most often diagnosed during an exam for other colon problems. The condition may be diagnosed by:  Using a flexible scope to examine the colon (colonoscopy).  Taking an X-ray of the colon after dye has been put into the colon (barium enema).  Having a CT scan. How is this treated? You may not need treatment for this condition. Your health care provider may recommend treatment to prevent problems. You may need treatment if you have symptoms or if you previously had diverticulitis. Treatment may include:  Eating a high-fiber diet.  Taking a fiber supplement.  Taking a live bacteria supplement (probiotic).  Taking medicine to relax your colon. Follow these instructions at home: Medicines  Take over-the-counter and prescription medicines only as told by your health care provider.  If told by your health care provider, take a fiber supplement or probiotic. Constipation prevention Your condition may cause constipation. To prevent or treat constipation, you may need to:  Drink enough fluid to keep your urine pale yellow.  Take over-the-counter or prescription medicines.  Eat foods that are high in fiber, such as beans, whole grains, and fresh fruits and vegetables.  Limit foods that are high in fat and processed sugars, such as fried or sweet foods.  General instructions  Try not to strain when you have a bowel  movement.  Keep all follow-up visits as told by your health care provider. This is important. Contact a health care provider if you:  Have pain in your abdomen.  Have bloating.  Have cramps.  Have not had a bowel movement in 3 days. Get help right away if:  Your pain gets worse.  Your bloating becomes very bad.  You have a fever or chills, and your symptoms suddenly get worse.  You vomit.  You have bowel movements that are bloody or black.  You have bleeding from your rectum. Summary  Diverticulosis is a condition that develops when small pouches (diverticula) form in the wall of the large intestine (colon).  You may have a few pouches or many of them.  This condition is most often diagnosed during an exam for other colon problems.  Treatment may include increasing the fiber in your diet, taking supplements, or taking medicines. This information is not intended to replace advice given to you by your health care provider. Make sure you discuss any questions you have with your health care provider. Document Revised: 11/21/2018 Document Reviewed: 11/21/2018 Elsevier Patient Education  [place].  Hemorrhoids Hemorrhoids are swollen veins that may develop:  In the butt (rectum). These are called internal hemorrhoids.  Around the opening of the butt (anus). These are called external hemorrhoids. Hemorrhoids can cause pain, itching, or bleeding. Most of the time, they do not cause serious problems. They usually get better with diet changes, lifestyle changes, and other home treatments. What are the causes? This condition may be caused by:  Having trouble pooping (constipation).  Pushing hard (straining) to poop.  Watery poop (diarrhea).  Pregnancy.  Being very overweight (obese).  Sitting for long periods of time.  Heavy lifting or other activity that causes you to strain.  Anal sex.  Riding a bike for a long period of time. What are the signs or  symptoms? Symptoms of this condition include:  Pain.  Itching or soreness in the butt.  Bleeding from the butt.  Leaking poop.  Swelling in the area.  One or more lumps around the opening of your butt. How is this diagnosed? A doctor can often diagnose this condition by looking at the affected area. The doctor may also:  Do an exam that involves feeling the area with a gloved hand (digital rectal exam).  Examine the area inside your butt using a small tube (anoscope).  Order blood tests. This may be done if you have lost a lot of blood.  Have you get a test that involves looking inside the colon using a flexible tube with a camera on the end (sigmoidoscopy or colonoscopy). How is this treated? This condition can usually be treated at home. Your doctor may tell you to change what you eat, make lifestyle changes, or try home treatments. If these do not help, procedures can be done to remove the hemorrhoids or make them smaller. These may involve:  Placing rubber bands at the base of the hemorrhoids to cut off their blood supply.  Injecting medicine into the hemorrhoids to shrink them.  Shining a type of light energy onto the hemorrhoids to cause them to fall off.  Doing surgery to remove the hemorrhoids or cut off their blood supply. Follow these instructions at home: Eating and drinking   Eat foods that have a lot of fiber in them. These include whole grains, beans, nuts, fruits, and vegetables.  Ask your doctor about taking products that have added fiber (fibersupplements).  Reduce the amount of fat in your diet. You can do this by: ? Eating low-fat dairy products. ? Eating less red meat. ? Avoiding processed foods.  Drink enough fluid to keep your pee (urine) pale yellow. Managing pain and swelling   Take a warm-water bath (sitz bath) for 20 minutes to ease pain. Do this 3-4 times a day. You may do this in a bathtub or using a portable sitz bath that fits over the  toilet.  If told, put ice on the painful area. It may be helpful to use ice between your warm baths. ? Put ice in a plastic bag. ? Place a towel between your skin and the bag. ? Leave the ice on for 20 minutes, 2-3 times a day. General instructions  Take over-the-counter and prescription medicines only as told by your doctor. ? Medicated creams and medicines may be used as told.  Exercise often. Ask your doctor how much and what kind of exercise is best for you.  Go to the bathroom when you have the urge to poop. Do not wait.  Avoid pushing too hard when  you poop.  Keep your butt dry and clean. Use wet toilet paper or moist towelettes after pooping.  Do not sit on the toilet for a long time.  Keep all follow-up visits as told by your doctor. This is important. Contact a doctor if you:  Have pain and swelling that do not get better with treatment or medicine.  Have trouble pooping.  Cannot poop.  Have pain or swelling outside the area of the hemorrhoids. Get help right away if you have:  Bleeding that will not stop. Summary  Hemorrhoids are swollen veins in the butt or around the opening of the butt.  They can cause pain, itching, or bleeding.  Eat foods that have a lot of fiber in them. These include whole grains, beans, nuts, fruits, and vegetables.  Take a warm-water bath (sitz bath) for 20 minutes to ease pain. Do this 3-4 times a day. This information is not intended to replace advice given to you by your health care provider. Make sure you discuss any questions you have with your health care provider. Document Revised: 05/02/2018 Document Reviewed: 09/13/2017 Elsevier Patient Education  [place].  Colon Polyps  Polyps are tissue growths inside the body. Polyps can grow in many places, including the large intestine (colon). A polyp may be a round bump or a mushroom-shaped growth. You could have one polyp or several. Most colon polyps are noncancerous  (benign). However, some colon polyps can become cancerous over time. Finding and removing the polyps early can help prevent this. What are the causes? The exact cause of colon polyps is not known. What increases the risk? You are more likely to develop this condition if you:  Have a family history of colon cancer or colon polyps.  Are older than 64 or older than 45 if you are African American.  Have inflammatory bowel disease, such as ulcerative colitis or Crohn's disease.  Have certain hereditary conditions, such as: ? Familial adenomatous polyposis. ? Lynch syndrome. ? Turcot syndrome. ? Peutz-Jeghers syndrome.  Are overweight.  Smoke cigarettes.  Do not get enough exercise.  Drink too much alcohol.  Eat a diet that is high in fat and red meat and low in fiber.  Had childhood cancer that was treated with abdominal radiation. What are the signs or symptoms? Most polyps do not cause symptoms. If you have symptoms, they may include:  Blood coming from your rectum when having a bowel movement.  Blood in your stool. The stool may look dark red or black.  Abdominal pain.  A change in bowel habits, such as constipation or diarrhea. How is this diagnosed? This condition is diagnosed with a colonoscopy. This is a procedure in which a lighted, flexible scope is inserted into the anus and then passed into the colon to examine the area. Polyps are sometimes found when a colonoscopy is done as part of routine cancer screening tests. How is this treated? Treatment for this condition involves removing any polyps that are found. Most polyps can be removed during a colonoscopy. Those polyps will then be tested for cancer. Additional treatment may be needed depending on the results of testing. Follow these instructions at home: Lifestyle  Maintain a healthy weight, or lose weight if recommended by your health care provider.  Exercise every day or as told by your health care  provider.  Do not use any products that contain nicotine or tobacco, such as cigarettes and e-cigarettes. If you need help quitting, ask your health care  provider.  If you drink alcohol, limit how much you have: ? 0-1 drink a day for women. ? 0-2 drinks a day for men.  Be aware of how much alcohol is in your drink. In the [place] one drink equals one 12 oz bottle of beer (355 mL), one 5 oz glass of wine (148 mL), or one 1 oz shot of hard liquor (44 mL). Eating and drinking   Eat foods that are high in fiber, such as fruits, vegetables, and whole grains.  Eat foods that are high in calcium and vitamin D, such as milk, cheese, yogurt, eggs, liver, fish, and broccoli.  Limit foods that are high in fat, such as fried foods and desserts.  Limit the amount of red meat and processed meat you eat, such as hot dogs, sausage, bacon, and lunch meats. General instructions  Keep all follow-up visits as told by your health care provider. This is important. ? This includes having regularly scheduled colonoscopies. ? Talk to your health care provider about when you need a colonoscopy. Contact a health care provider if:  You have new or worsening bleeding during a bowel movement.  You have new or increased blood in your stool.  You have a change in bowel habits.  You lose weight for no known reason. Summary  Polyps are tissue growths inside the body. Polyps can grow in many places, including the colon.  Most colon polyps are noncancerous (benign), but some can become cancerous over time.  This condition is diagnosed with a colonoscopy.  Treatment for this condition involves removing any polyps that are found. Most polyps can be removed during a colonoscopy. This information is not intended to replace advice given to you by your health care provider. Make sure you discuss any questions you have with your health care provider. Document Revised: 08/09/2017 Document Reviewed:  08/09/2017 Elsevier Patient Education  [place].

## 2020-02-24 LAB — SURGICAL PATHOLOGY

## 2020-02-26 ENCOUNTER — Encounter (HOSPITAL_COMMUNITY): Payer: Self-pay | Admitting: Internal Medicine

## 2020-03-08 DIAGNOSIS — H5203 Hypermetropia, bilateral: Secondary | ICD-10-CM | POA: Diagnosis not present

## 2020-03-08 DIAGNOSIS — Z961 Presence of intraocular lens: Secondary | ICD-10-CM | POA: Diagnosis not present

## 2020-03-08 DIAGNOSIS — H524 Presbyopia: Secondary | ICD-10-CM | POA: Diagnosis not present

## 2020-03-08 DIAGNOSIS — H52203 Unspecified astigmatism, bilateral: Secondary | ICD-10-CM | POA: Diagnosis not present

## 2020-03-08 DIAGNOSIS — H401131 Primary open-angle glaucoma, bilateral, mild stage: Secondary | ICD-10-CM | POA: Diagnosis not present

## 2020-03-10 DIAGNOSIS — Z23 Encounter for immunization: Secondary | ICD-10-CM | POA: Diagnosis not present

## 2020-03-16 DIAGNOSIS — L720 Epidermal cyst: Secondary | ICD-10-CM | POA: Diagnosis not present

## 2020-04-06 DIAGNOSIS — I10 Essential (primary) hypertension: Secondary | ICD-10-CM | POA: Diagnosis not present

## 2020-04-06 DIAGNOSIS — E7800 Pure hypercholesterolemia, unspecified: Secondary | ICD-10-CM | POA: Diagnosis not present

## 2020-04-06 DIAGNOSIS — R739 Hyperglycemia, unspecified: Secondary | ICD-10-CM | POA: Diagnosis not present

## 2020-05-07 DIAGNOSIS — I10 Essential (primary) hypertension: Secondary | ICD-10-CM | POA: Diagnosis not present

## 2020-05-07 DIAGNOSIS — E7800 Pure hypercholesterolemia, unspecified: Secondary | ICD-10-CM | POA: Diagnosis not present

## 2020-05-07 DIAGNOSIS — R739 Hyperglycemia, unspecified: Secondary | ICD-10-CM | POA: Diagnosis not present

## 2020-05-26 DIAGNOSIS — R739 Hyperglycemia, unspecified: Secondary | ICD-10-CM | POA: Diagnosis not present

## 2020-05-26 DIAGNOSIS — R7989 Other specified abnormal findings of blood chemistry: Secondary | ICD-10-CM | POA: Diagnosis not present

## 2020-05-26 DIAGNOSIS — E7800 Pure hypercholesterolemia, unspecified: Secondary | ICD-10-CM | POA: Diagnosis not present

## 2020-05-31 DIAGNOSIS — R739 Hyperglycemia, unspecified: Secondary | ICD-10-CM | POA: Diagnosis not present

## 2020-05-31 DIAGNOSIS — E7801 Familial hypercholesterolemia: Secondary | ICD-10-CM | POA: Diagnosis not present

## 2020-05-31 DIAGNOSIS — Z0001 Encounter for general adult medical examination with abnormal findings: Secondary | ICD-10-CM | POA: Diagnosis not present

## 2020-05-31 DIAGNOSIS — H4053X1 Glaucoma secondary to other eye disorders, bilateral, mild stage: Secondary | ICD-10-CM | POA: Diagnosis not present

## 2020-05-31 DIAGNOSIS — G629 Polyneuropathy, unspecified: Secondary | ICD-10-CM | POA: Diagnosis not present

## 2020-05-31 DIAGNOSIS — Z6826 Body mass index (BMI) 26.0-26.9, adult: Secondary | ICD-10-CM | POA: Diagnosis not present

## 2020-05-31 DIAGNOSIS — R260 Ataxic gait: Secondary | ICD-10-CM | POA: Diagnosis not present

## 2020-06-07 DIAGNOSIS — R739 Hyperglycemia, unspecified: Secondary | ICD-10-CM | POA: Diagnosis not present

## 2020-06-07 DIAGNOSIS — E7800 Pure hypercholesterolemia, unspecified: Secondary | ICD-10-CM | POA: Diagnosis not present

## 2020-06-07 DIAGNOSIS — I10 Essential (primary) hypertension: Secondary | ICD-10-CM | POA: Diagnosis not present

## 2020-06-22 DIAGNOSIS — L03119 Cellulitis of unspecified part of limb: Secondary | ICD-10-CM | POA: Diagnosis not present

## 2020-06-22 DIAGNOSIS — Z6826 Body mass index (BMI) 26.0-26.9, adult: Secondary | ICD-10-CM | POA: Diagnosis not present

## 2020-07-06 DIAGNOSIS — H401131 Primary open-angle glaucoma, bilateral, mild stage: Secondary | ICD-10-CM | POA: Diagnosis not present

## 2020-08-04 DIAGNOSIS — R739 Hyperglycemia, unspecified: Secondary | ICD-10-CM | POA: Diagnosis not present

## 2020-08-04 DIAGNOSIS — E7800 Pure hypercholesterolemia, unspecified: Secondary | ICD-10-CM | POA: Diagnosis not present

## 2020-08-04 DIAGNOSIS — I10 Essential (primary) hypertension: Secondary | ICD-10-CM | POA: Diagnosis not present

## 2020-09-04 DIAGNOSIS — I10 Essential (primary) hypertension: Secondary | ICD-10-CM | POA: Diagnosis not present

## 2020-09-04 DIAGNOSIS — R739 Hyperglycemia, unspecified: Secondary | ICD-10-CM | POA: Diagnosis not present

## 2020-09-04 DIAGNOSIS — E7800 Pure hypercholesterolemia, unspecified: Secondary | ICD-10-CM | POA: Diagnosis not present

## 2020-09-12 IMAGING — DX DG CHEST 2V
2 series · 2 of 2 positions shown · non-contrast
Comparison: 08/13/2006

CLINICAL DATA: Chest pain

EXAM:
CHEST - 2 VIEW

[chest pa]
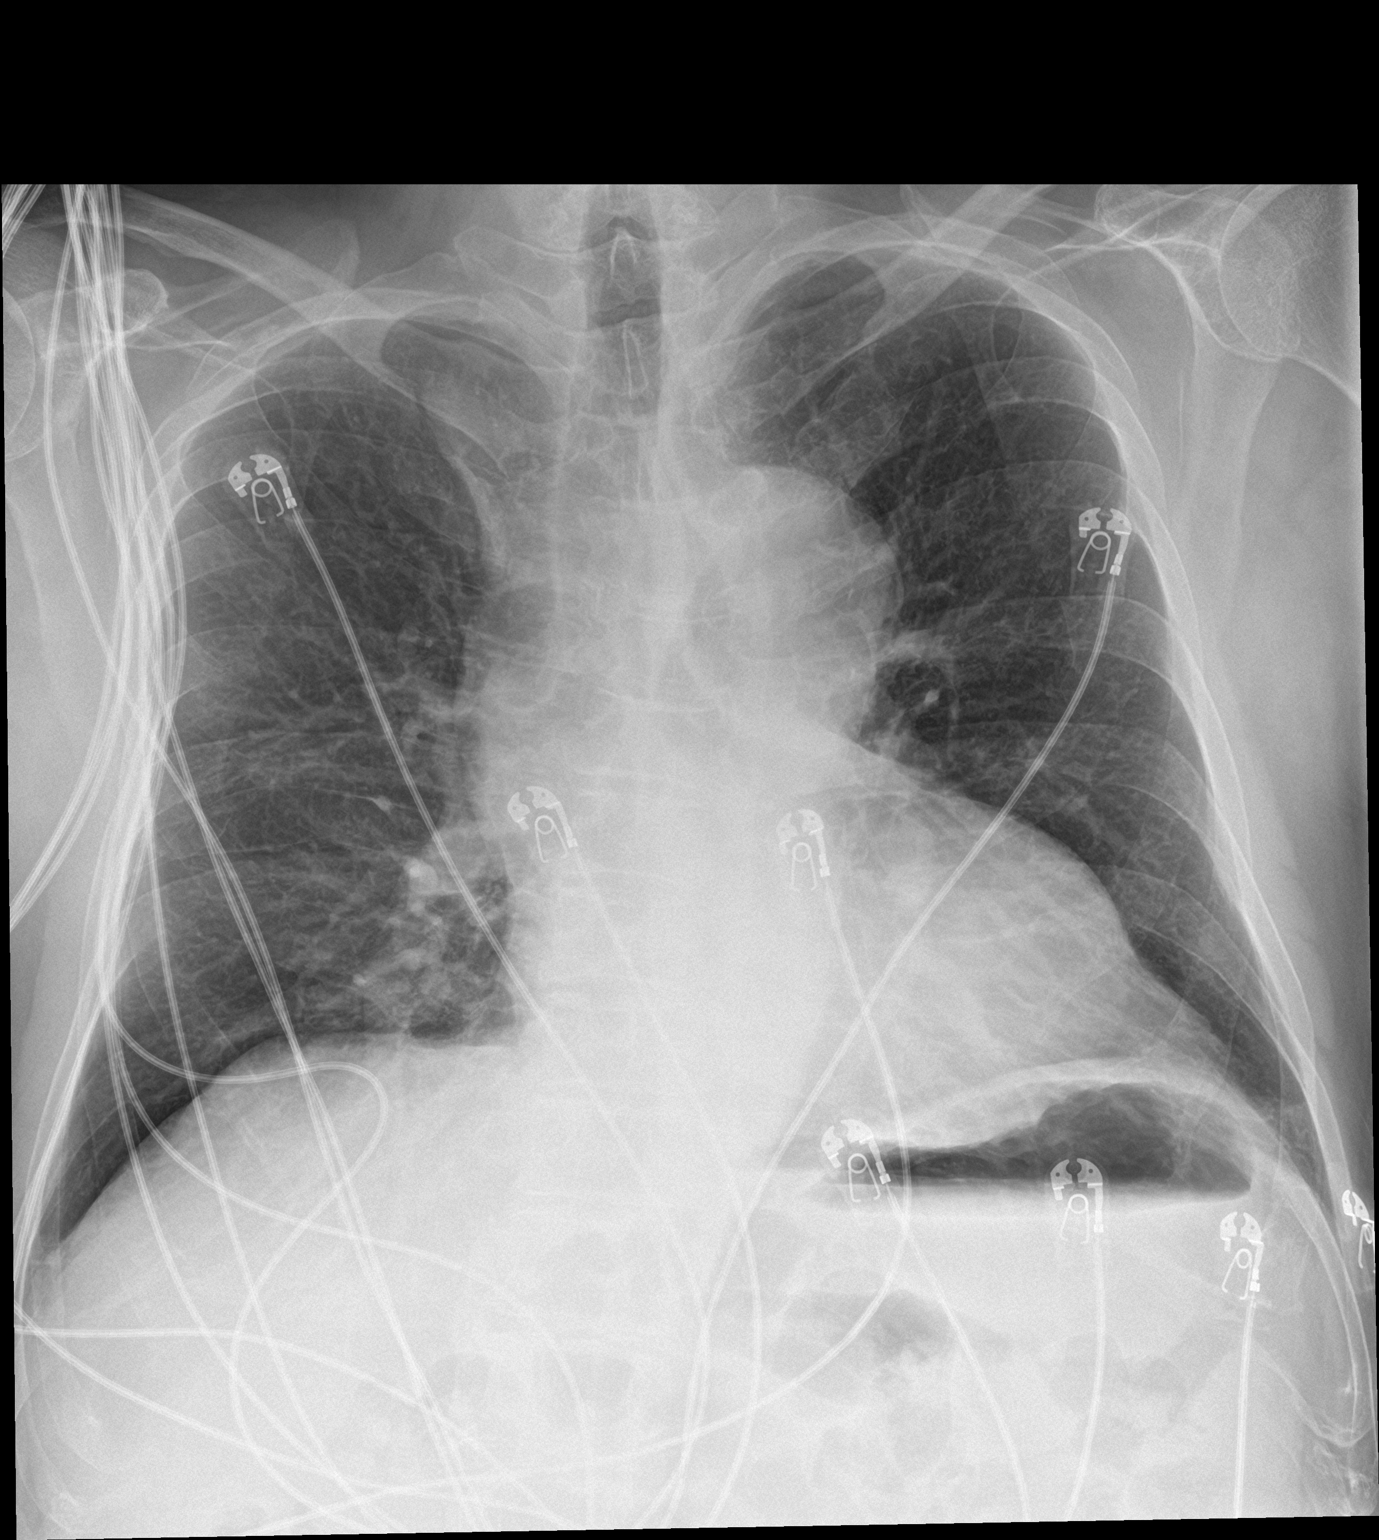

[chest lat]
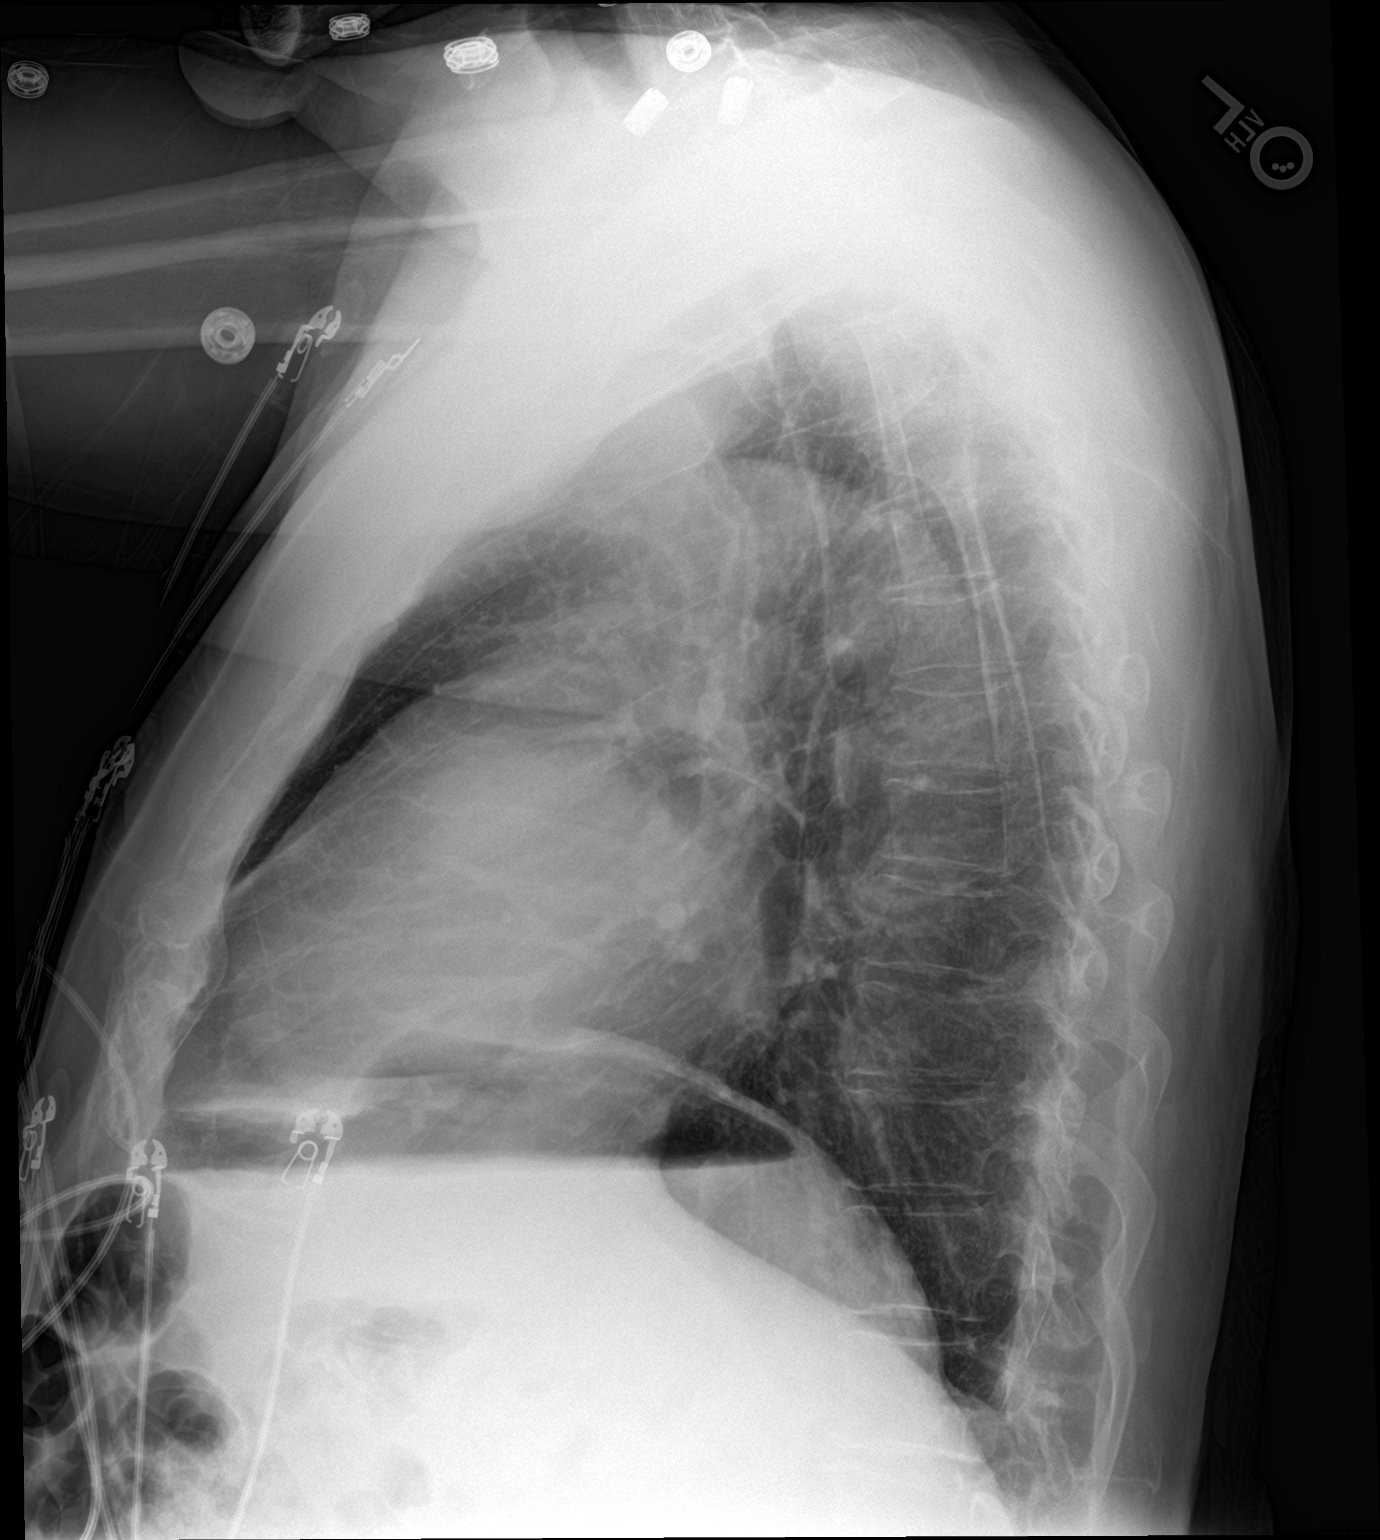

[2 of 2 positions shown; findings below may reference images not displayed]

FINDINGS: Upper normal heart size.

Accentuation of aortic arch silhouette versus prior study.

Mediastinal contours and pulmonary vascularity normal.

Lungs clear.

No infiltrate, pleural effusion or pneumothorax.

Bones appear demineralized.
IMPRESSION: Prominent aortic arch silhouette, cannot exclude aortic aneurysm;
consider CTA imaging for further assessment.

No acute infiltrate.

Findings called to Dr. Wagle on 10/31/2019 at 9424 hrs.

## 2020-09-12 IMAGING — CT CT ANGIO CHEST-ABD-PELV FOR DISSECTION W/ AND WO/W CM
2 of 7 series · 13 of 46 positions shown, 15 images · IV contrast (APPLIED)
Comparison: Chest radiograph dated 10/31/2019.

CLINICAL DATA: 74-year-old male with chest and back pain. Concern
for aortic dissection.

EXAM:
CT ANGIOGRAPHY CHEST, ABDOMEN AND PELVIS
TECHNIQUE: Non-contrast CT of the chest was initially obtained.

[Series 6: arterial · axial · arterial · 0.84mm/px · z∈[-426,+182]mm · 10 of 344 slices shown, 12 images]
[im 20/344  soft-tissue]
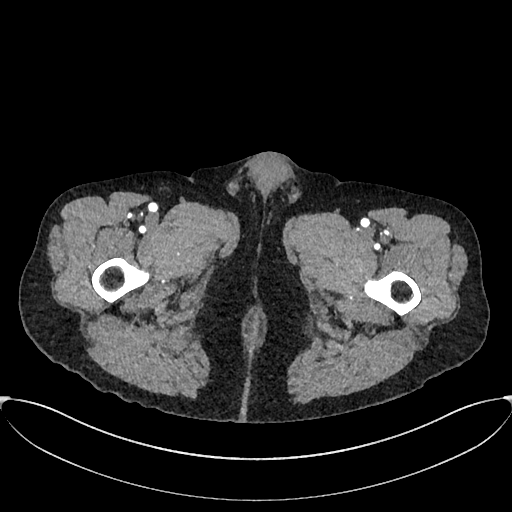
[im 20/344  bone]
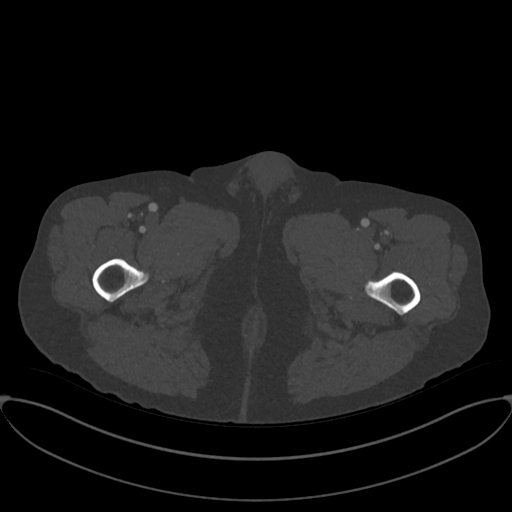
[im 58/344  soft-tissue]
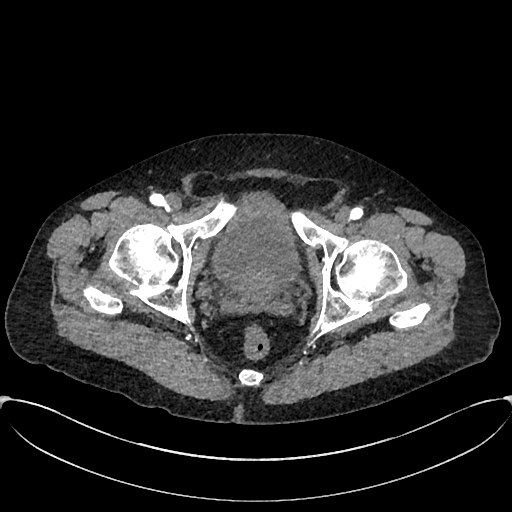
[im 96/344  soft-tissue]
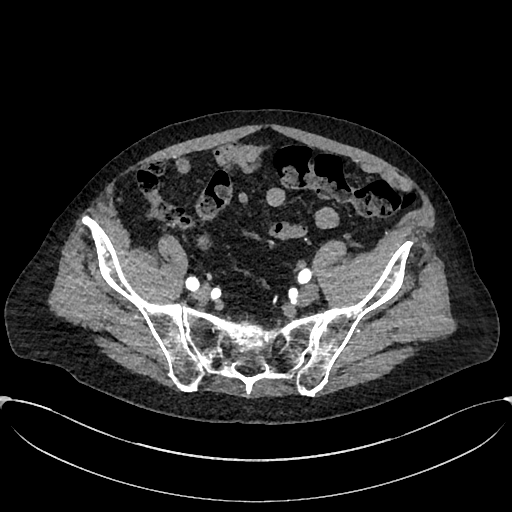
[im 115/344  soft-tissue]
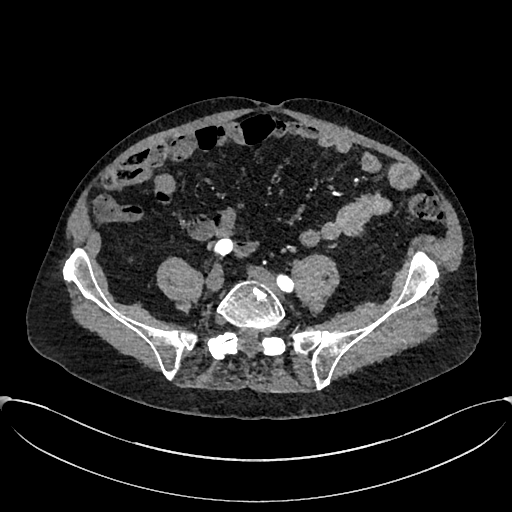
[im 153/344  soft-tissue]
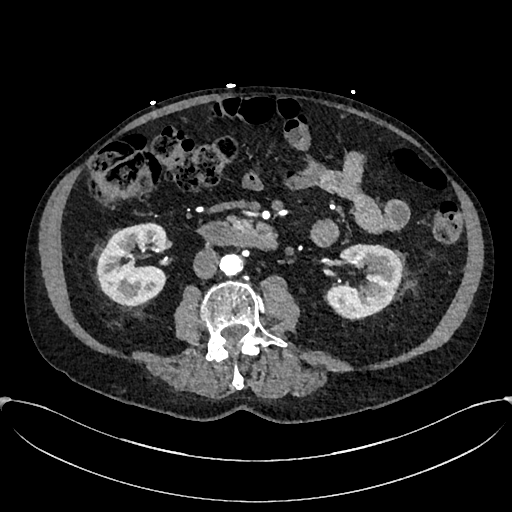
[im 191/344  soft-tissue]
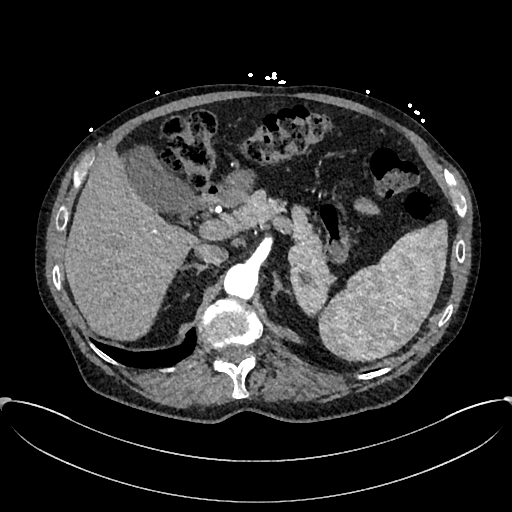
[im 229/344  soft-tissue]
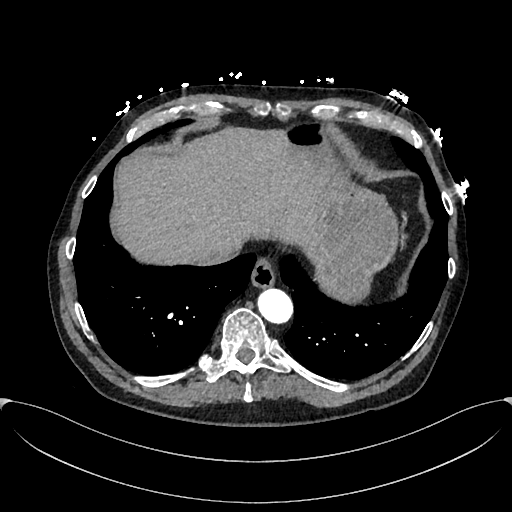
[im 248/344  soft-tissue]
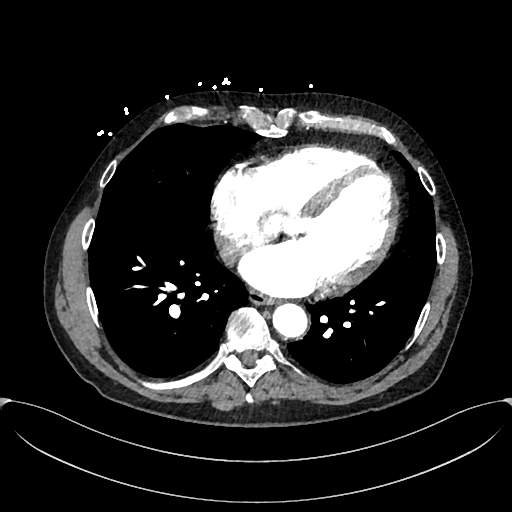
[im 286/344  soft-tissue]
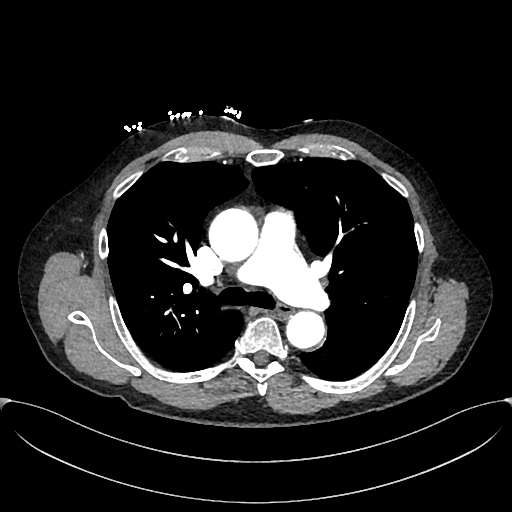
[im 286/344  bone]
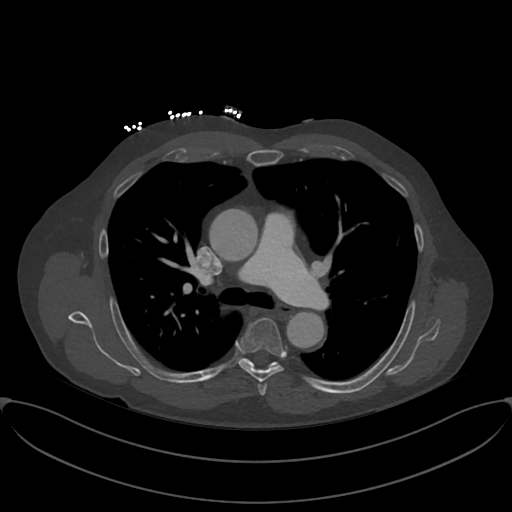
[im 324/344  soft-tissue]
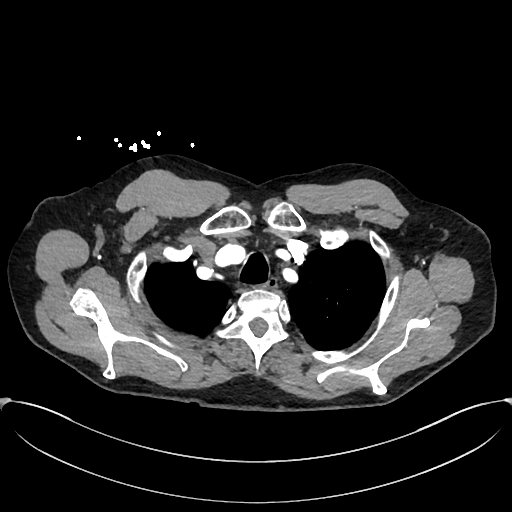

[Series 9: cor · coronal · 0.93mm/px · 3 of 162 slices shown]
[im 41/162  soft-tissue]
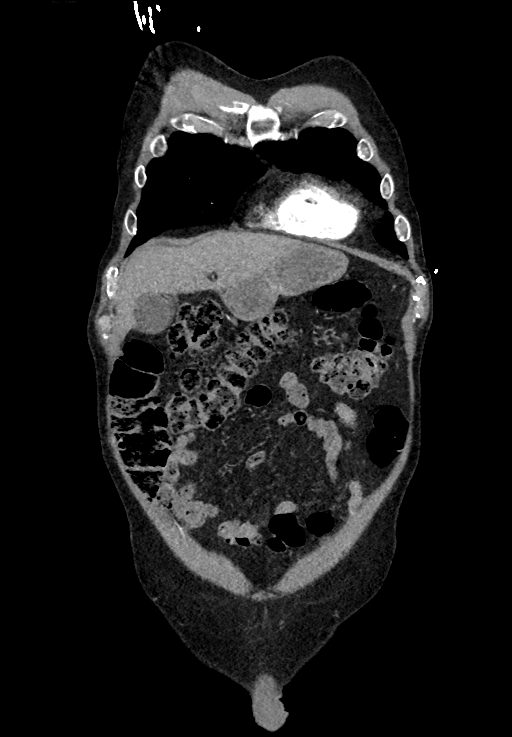
[im 81/162  soft-tissue]
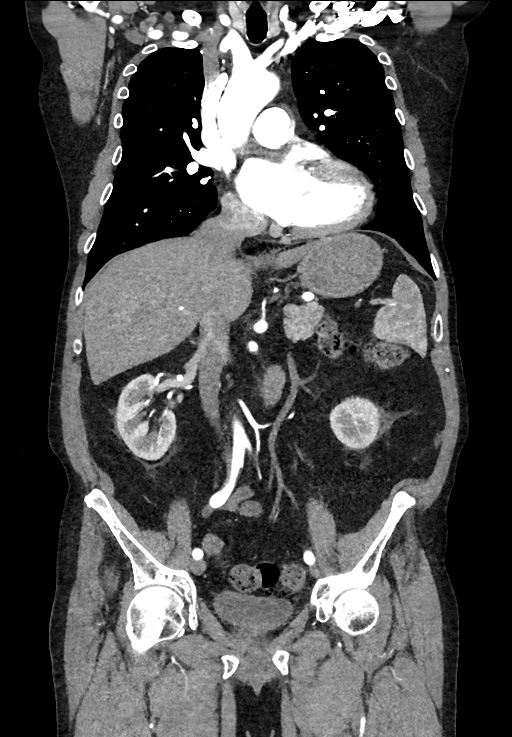
[im 121/162  soft-tissue]
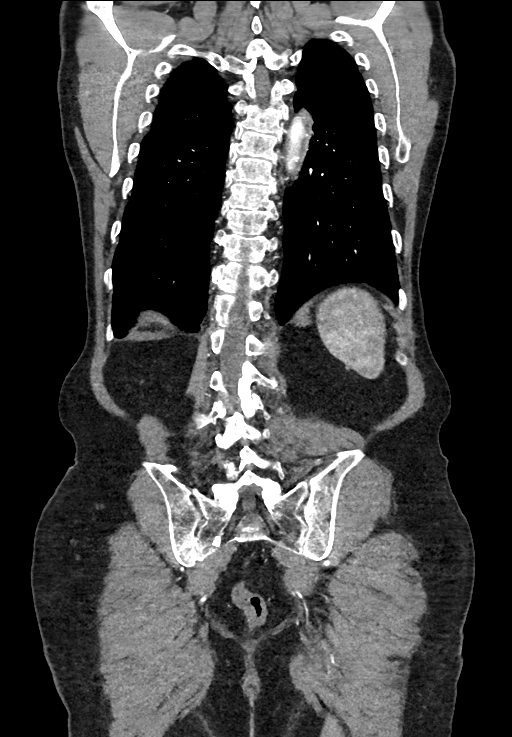

[13 of 46 positions shown; findings below may reference images not displayed]

Multidetector CT imaging through the chest, abdomen and pelvis was
performed using the standard protocol during bolus administration of
intravenous contrast. Multiplanar reconstructed images and MIPs were
obtained and reviewed to evaluate the vascular anatomy.

CONTRAST:  100mL OMNIPAQUE IOHEXOL 350 MG/ML SOLN
FINDINGS: CTA CHEST FINDINGS

Cardiovascular: There is mild cardiomegaly. No pericardial effusion.
There is mild atherosclerotic calcification of the thoracic aorta.
No aneurysmal dilatation or dissection. The origins of the great
vessels the aortic arch appear patent as visualized. There is common
origin of the right brachiocephalic trunk and left common carotid
artery. There is no CT evidence of pulmonary embolism.

Mediastinum/Nodes: No hilar or mediastinal adenopathy. The esophagus
and the thyroid gland are grossly unremarkable. No mediastinal fluid
collection.

Lungs/Pleura: The lungs are clear. There is no pleural effusion
pneumothorax. The central airways are patent.

Musculoskeletal: Degenerative changes of the spine. No acute osseous
pathology.

Review of the MIP images confirms the above findings.

CTA ABDOMEN AND PELVIS FINDINGS

VASCULAR

Aorta: Mild atherosclerotic calcification. No aneurysmal dilatation
or dissection. No periaortic fluid collection.

Celiac: Patent without evidence of aneurysm, dissection, vasculitis
or significant stenosis.

SMA: Patent without evidence of aneurysm, dissection, vasculitis or
significant stenosis.

Renals: Both renal arteries are patent without evidence of aneurysm,
dissection, vasculitis, fibromuscular dysplasia or significant
stenosis.

IMA: Patent without evidence of aneurysm, dissection, vasculitis or
significant stenosis.

Inflow: Patent without evidence of aneurysm, dissection, vasculitis
or significant stenosis.

Veins: No obvious venous abnormality within the limitations of this
arterial phase study.

Review of the MIP images confirms the above findings.

NON-VASCULAR

No intra-abdominal free air or free fluid.

Hepatobiliary: The liver is unremarkable. No intrahepatic biliary
ductal dilatation. There is apparent gallbladder wall thickening and
haziness with mild stranding of the pericholecystic fat concerning
for acute cholecystitis. No calcified gallstone identified. Further
evaluation with ultrasound recommended.

Pancreas: There is a 14 mm hypodense lesion in the tail of the
pancreas which is not characterized but may represent a side branch
IPMN. Further characterization with MRI without and with contrast on
a nonemergent/outpatient basis recommended. The pancreas is
otherwise unremarkable. No active inflammatory changes. There is no
dilatation of the main pancreatic duct or gland atrophy.

Spleen: Normal in size without focal abnormality.

Adrenals/Urinary Tract: The adrenal glands unremarkable. The
kidneys, visualized ureters, appear unremarkable. Small amount of
layering high attenuation content within the urinary bladder likely
represents excreted contrast. Small stones or proteinaceous debris
are less likely but not excluded. Correlation with urinalysis
recommended.

Stomach/Bowel: There is sigmoid diverticulosis without active
inflammatory changes. There is moderate stool throughout the colon.
There is no bowel obstruction or active inflammation. The appendix
is normal.

Apparent soft tissue mass in the left anterior pelvis ([DATE]) likely
a cluster of adjacent small bowel loops. Attention on follow-up
imaging recommended.

Lymphatic: No adenopathy.

Reproductive: The prostate and seminal vesicles are grossly
unremarkable. No pelvic mass.

Other: None

Musculoskeletal: Osteopenia with degenerative changes of the spine
and scoliosis. No acute osseous pathology.

Review of the MIP images confirms the above findings.
IMPRESSION: 1. No aortic dissection or aneurysm. No CT evidence of pulmonary
embolism.
2. Findings concerning for acute cholecystitis. Further evaluation
with ultrasound recommended.
3. A 14 mm hypodense lesion in the tail of the pancreas, not
characterized but may represent a side branch IPMN. Further
characterization with MRI without and with contrast on a
nonemergent/outpatient basis recommended.
4. Sigmoid diverticulosis. No bowel obstruction. Normal appendix.

## 2020-09-13 IMAGING — US US ABDOMEN LIMITED
1 series · 14 of 25 positions shown · non-contrast
Comparison: CT 10/31/2019

CLINICAL DATA: Right upper quadrant pain, abnormal CT

EXAM:
ULTRASOUND ABDOMEN LIMITED RIGHT UPPER QUADRANT

[Series 1: us abdomen limited · 14 of 68 slices shown]
[im 1/68]
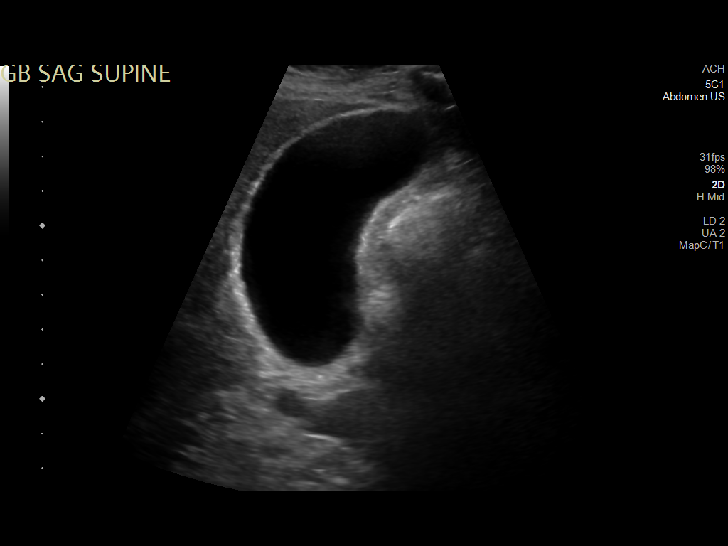
[im 6/68]
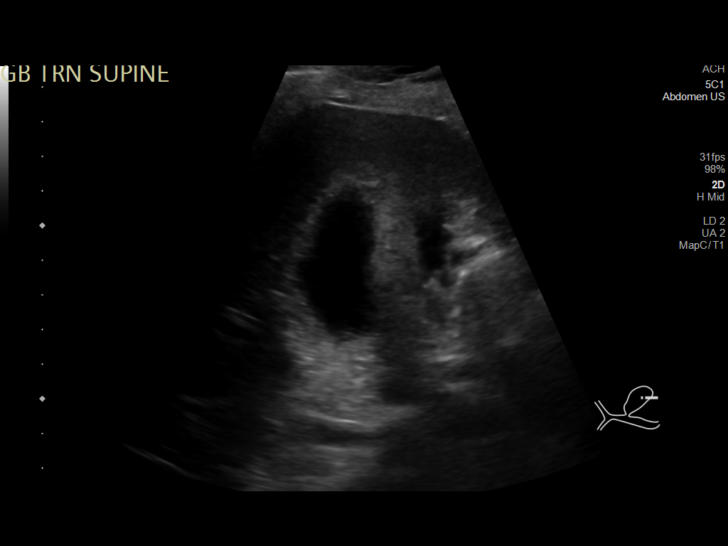
[im 12/68]
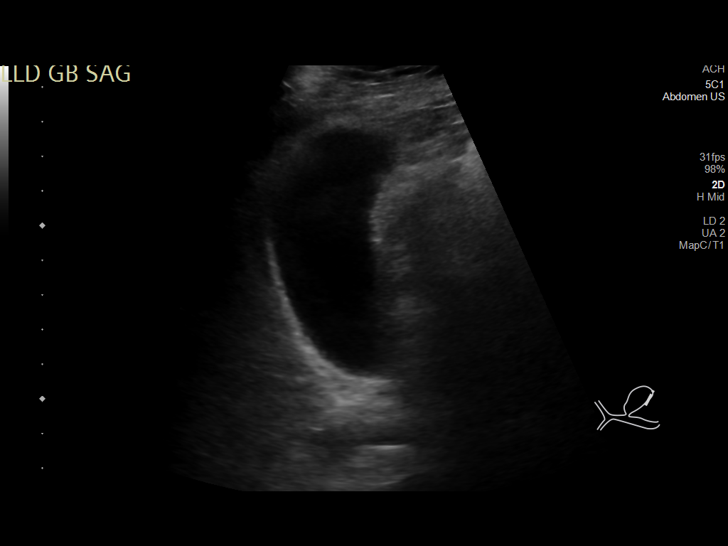
[im 17/68]
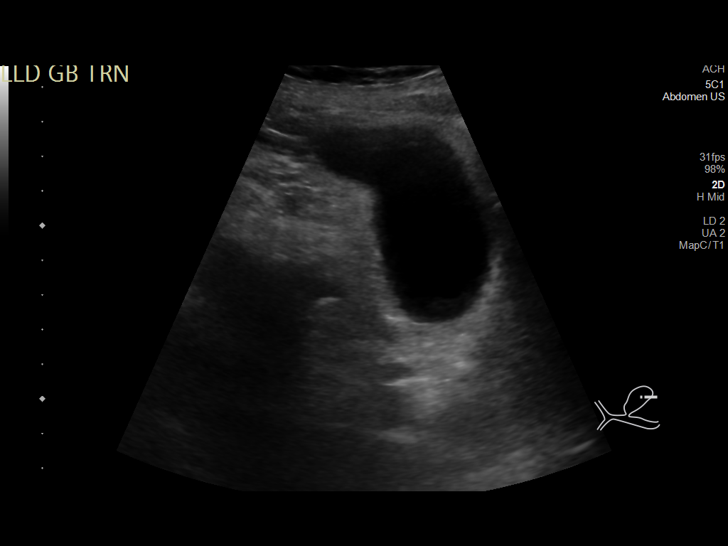
[im 23/68]
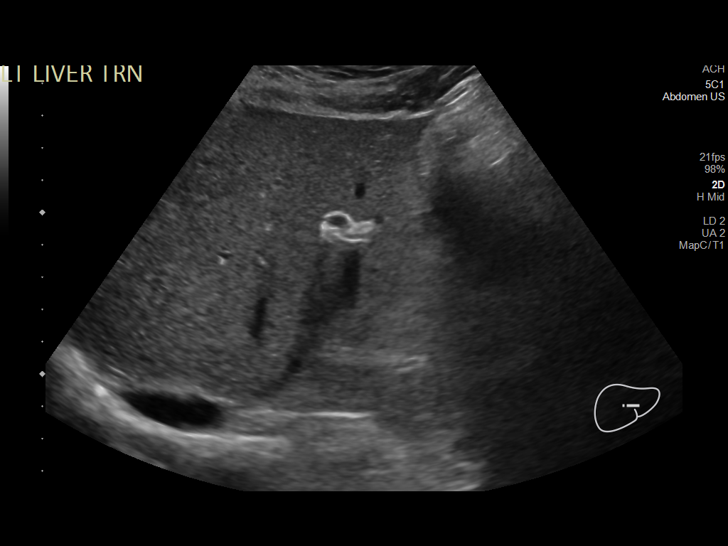
[im 26/68]
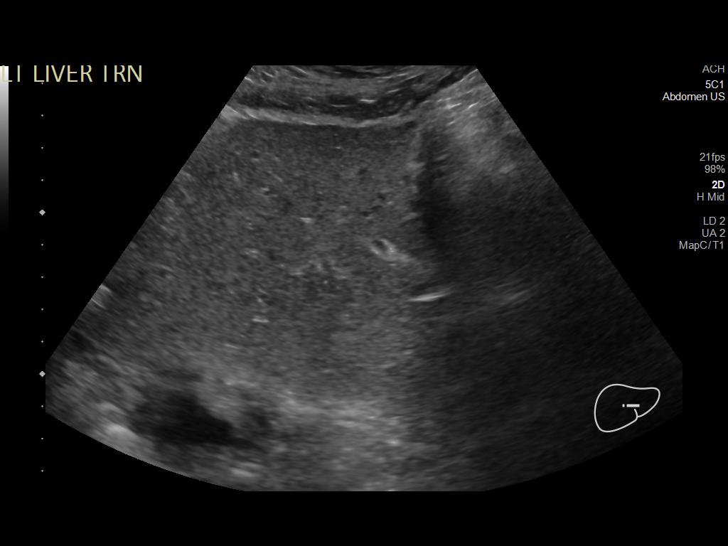
[im 31/68]
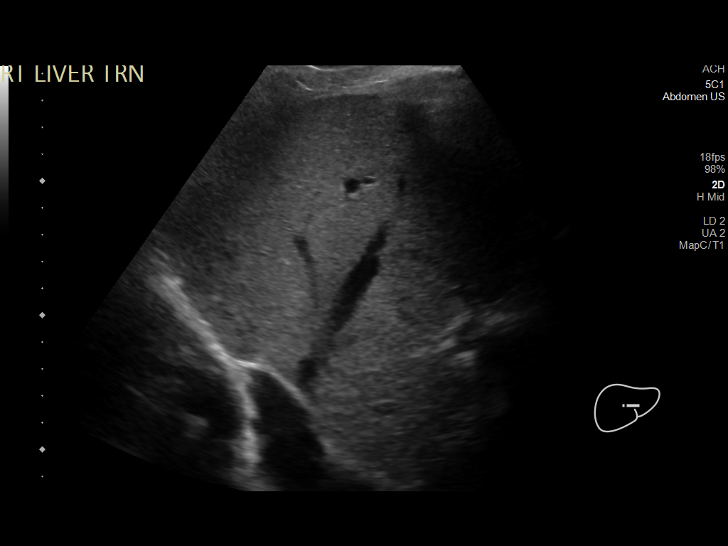
[im 37/68]
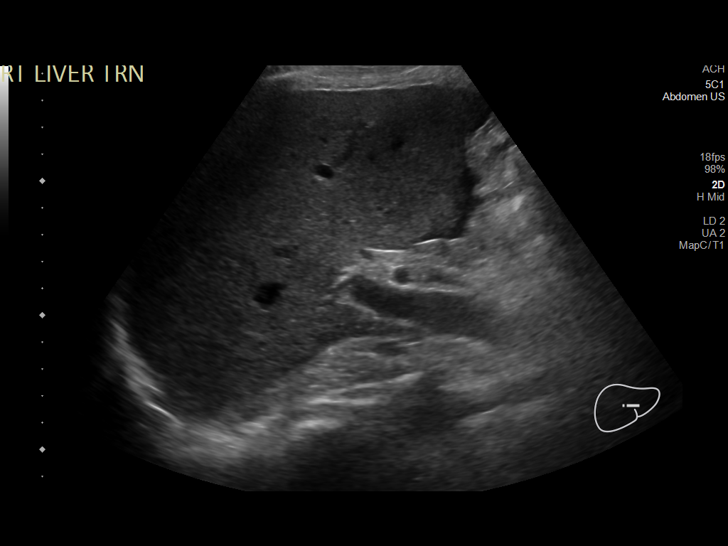
[im 42/68]
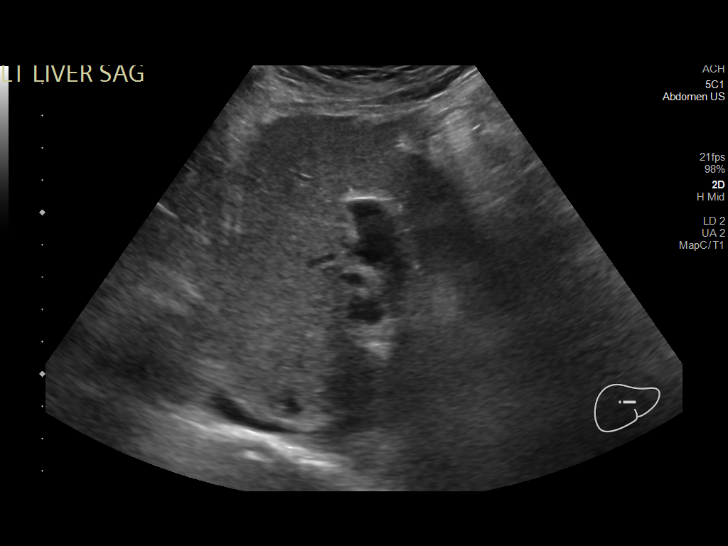
[im 45/68]
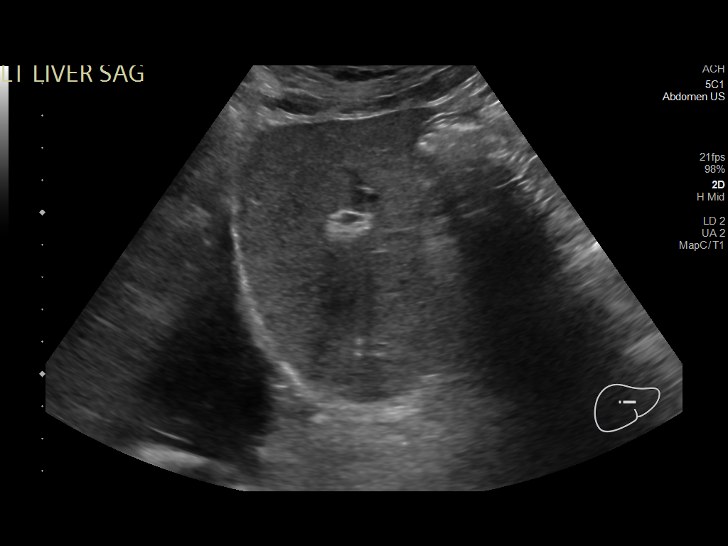
[im 51/68]
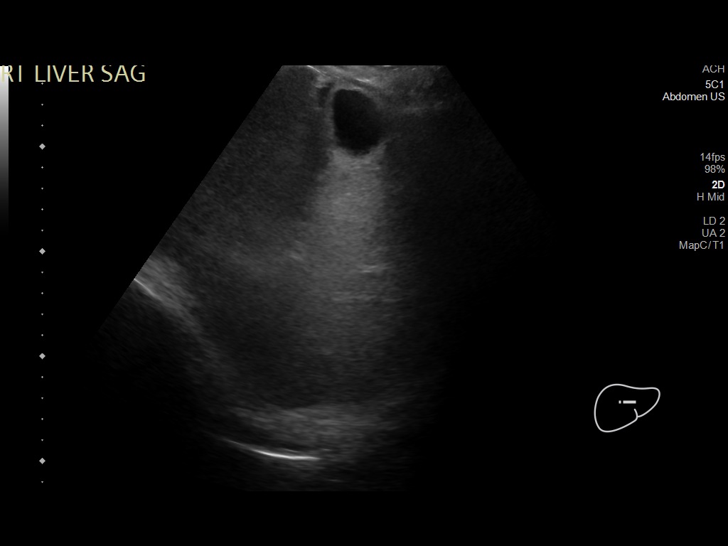
[im 56/68]
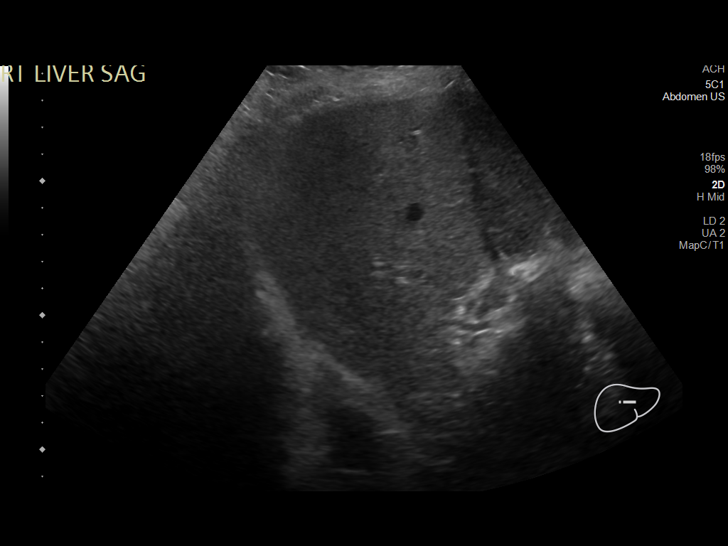
[im 62/68]
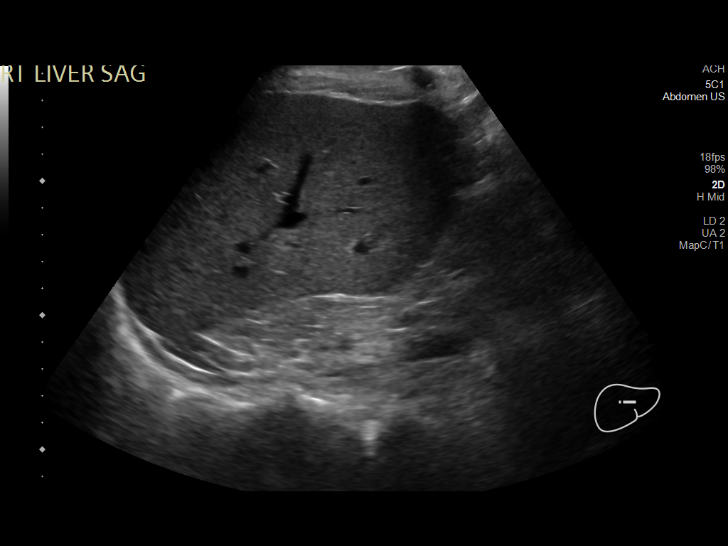
[im 68/68]
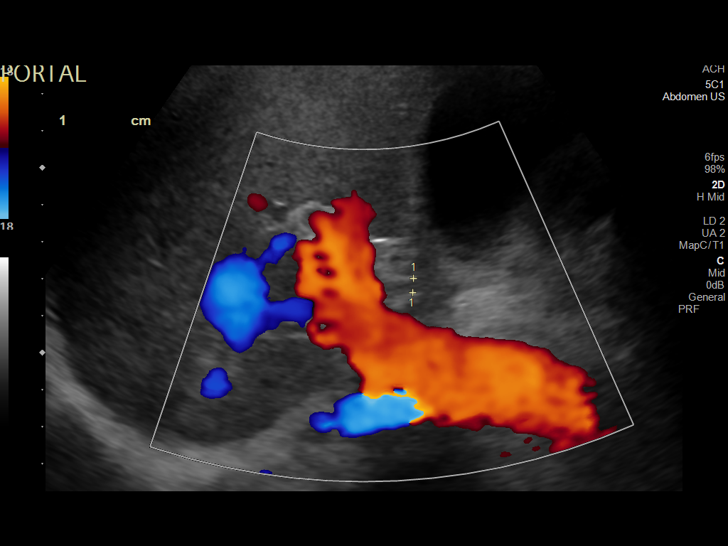

[14 of 25 positions shown; findings below may reference images not displayed]

FINDINGS: Gallbladder:

Gallbladder wall thickness of 2.3 mm is within normal limits. No
pericholecystic fluid is seen. Sonographic Murphy sign is reportedly
negative. No visible calcified gallstones or biliary sludge.

Common bile duct:

Diameter: 3.7 mm proximally, distally 5.2 mm.  Nondilated.

Liver:

No focal lesion identified. Within normal limits in parenchymal
echogenicity. Portal vein is patent on color Doppler imaging with
normal direction of blood flow towards the liver.

Other: None.
IMPRESSION: No sonographic features of acute cholecystitis. If there is
persisting clinical concern given the discordant CT findings,
consider further evaluation with HIDA.

## 2020-10-04 DIAGNOSIS — R739 Hyperglycemia, unspecified: Secondary | ICD-10-CM | POA: Diagnosis not present

## 2020-10-04 DIAGNOSIS — E7800 Pure hypercholesterolemia, unspecified: Secondary | ICD-10-CM | POA: Diagnosis not present

## 2020-10-04 DIAGNOSIS — I10 Essential (primary) hypertension: Secondary | ICD-10-CM | POA: Diagnosis not present

## 2020-10-20 ENCOUNTER — Other Ambulatory Visit (HOSPITAL_COMMUNITY): Payer: Self-pay | Admitting: Family Medicine

## 2020-10-20 DIAGNOSIS — K862 Cyst of pancreas: Secondary | ICD-10-CM

## 2020-11-02 DIAGNOSIS — S90129A Contusion of unspecified lesser toe(s) without damage to nail, initial encounter: Secondary | ICD-10-CM | POA: Diagnosis not present

## 2020-11-02 DIAGNOSIS — Z6826 Body mass index (BMI) 26.0-26.9, adult: Secondary | ICD-10-CM | POA: Diagnosis not present

## 2020-11-04 ENCOUNTER — Other Ambulatory Visit: Payer: Self-pay

## 2020-11-04 ENCOUNTER — Ambulatory Visit (HOSPITAL_COMMUNITY)
Admission: RE | Admit: 2020-11-04 | Discharge: 2020-11-04 | Disposition: A | Discharge status: Home / Self Care | Payer: PPO | Source: Ambulatory Visit | Attending: Family Medicine | Admitting: Family Medicine

## 2020-11-04 ENCOUNTER — Other Ambulatory Visit (HOSPITAL_COMMUNITY): Payer: Self-pay | Admitting: Family Medicine

## 2020-11-04 DIAGNOSIS — K862 Cyst of pancreas: Secondary | ICD-10-CM

## 2020-11-04 DIAGNOSIS — E7800 Pure hypercholesterolemia, unspecified: Secondary | ICD-10-CM | POA: Diagnosis not present

## 2020-11-04 DIAGNOSIS — I10 Essential (primary) hypertension: Secondary | ICD-10-CM | POA: Diagnosis not present

## 2020-11-04 DIAGNOSIS — R739 Hyperglycemia, unspecified: Secondary | ICD-10-CM | POA: Diagnosis not present

## 2020-11-04 MED ORDER — GADOBUTROL 1 MMOL/ML IV SOLN
9.0000 mL | Freq: Once | INTRAVENOUS | Status: AC | PRN
  Administered 2020-11-04: 08:00:00 9 mL via INTRAVENOUS

## 2020-11-09 DIAGNOSIS — H35372 Puckering of macula, left eye: Secondary | ICD-10-CM | POA: Diagnosis not present

## 2020-11-09 DIAGNOSIS — H401131 Primary open-angle glaucoma, bilateral, mild stage: Secondary | ICD-10-CM | POA: Diagnosis not present

## 2020-11-16 ENCOUNTER — Encounter (INDEPENDENT_AMBULATORY_CARE_PROVIDER_SITE_OTHER): Payer: PPO | Admitting: Ophthalmology

## 2020-11-18 ENCOUNTER — Other Ambulatory Visit: Payer: Self-pay

## 2020-11-18 ENCOUNTER — Ambulatory Visit (INDEPENDENT_AMBULATORY_CARE_PROVIDER_SITE_OTHER): Payer: PPO | Admitting: Ophthalmology

## 2020-11-18 ENCOUNTER — Encounter (INDEPENDENT_AMBULATORY_CARE_PROVIDER_SITE_OTHER): Payer: Self-pay | Admitting: Ophthalmology

## 2020-11-18 DIAGNOSIS — H35371 Puckering of macula, right eye: Secondary | ICD-10-CM | POA: Diagnosis not present

## 2020-11-18 DIAGNOSIS — H35372 Puckering of macula, left eye: Secondary | ICD-10-CM | POA: Insufficient documentation

## 2020-11-18 DIAGNOSIS — H401132 Primary open-angle glaucoma, bilateral, moderate stage: Secondary | ICD-10-CM | POA: Diagnosis not present

## 2020-11-18 NOTE — Assessment & Plan Note (Signed)
Minor epiretinal membrane with with a minor inner foveal distortion with no impact on acuity in the right eye.  I do recommend observe only

## 2020-11-18 NOTE — Assessment & Plan Note (Signed)
The nature of macular pucker (epiretinal membrane ERM) was discussed with the patient as well as threshold criteria for vitrectomy surgery. I explained that in rare cases another surgery is needed to actually remove a second wrinkle should it regrow.  Most often, the epiretinal membrane and underlying wrinkled internal limiting membrane are removed with the first surgery, to accomplish the goals.   If the operative eye is Phakic (natural lens still present), cataract surgery is often recommended prior to Vitrectomy. This will enable the retina surgeon to have the best view during surgery and the patient to obtain optimal results in the future. Treatment options were discussed.  With severe visual acuity impact left eye thickening all indicating the requirement and the recommendation for vitrectomy membrane peel left eye under local MAC anesthesia.  The patient does have glaucoma thus we will use not prednisolone acetate but rather Lotemax topical mild steroid formulation so as not to exacerbate glaucoma during the topical medication usage

## 2020-11-18 NOTE — Assessment & Plan Note (Signed)
Patient instructed to continue on current glaucoma medications as directed by Dr. [name]

## 2020-11-18 NOTE — Progress Notes (Signed)
11/18/2020     CHIEF COMPLAINT Patient presents for Retina Evaluation (NP- ERM OS- Referred by Shapiro/ Pt states,"Sometimes when I look from side to side I can see a floating curtain that moves from side to side." But that is not all the time./Pt reports using Cosopt BID OS and also Latanoprost QHS OU)   HISTORY OF PRESENT ILLNESS: Roberto Rosales is a 75 y.o. male who presents to the clinic today for:   HPI     Retina Evaluation           Laterality: left eye   Onset: 2 weeks ago   Duration: 2 weeks   Associated Symptoms: Floaters and Distortion.  Negative for Flashes (Sometimes when I look from side to side I can see "a floating curtain that moves from side to side." But that is not all the time.)   Comments: NP- ERM OS- Referred by Roberto Rosales  Pt states,"Sometimes when I look from side to side I can see a floating curtain that moves from side to side." But that is not all the time. Pt reports using Cosopt BID OS and also Latanoprost QHS OU         Comments   New onset blurred vision left eye over the last 4 months.  Found by Dr. [name] to have possible epiretinal membrane left eye      Last edited by Roberto Horn, MD on 11/18/2020  1:33 PM.      Referring physician: [name], [place],  [place] [address]  HISTORICAL INFORMATION:   Selected notes from the MEDICAL RECORD NUMBER    Lab Results  Component Value Date   HGBA1C 5.4 02/22/2017     CURRENT MEDICATIONS: Current Outpatient Medications (Ophthalmic Drugs)  Medication Sig   dorzolamide-timolol (COSOPT) 22.3-6.8 MG/ML ophthalmic solution Place 1 drop into the left eye 2 (two) times daily.   latanoprost (XALATAN) 0.005 % ophthalmic solution Place 1 drop into both eyes at bedtime.    No current facility-administered medications for this visit. (Ophthalmic Drugs)   Current Outpatient Medications (Other)  Medication Sig   acetaminophen (TYLENOL) 500 MG tablet Take 500-1,000 mg  by mouth every 6 (six) hours as needed (for pain.).   atorvastatin (LIPITOR) 10 MG tablet Take 5 mg by mouth at bedtime.    Multiple Vitamin (MULTIVITAMIN WITH MINERALS) TABS tablet Take 1 tablet by mouth daily.   naproxen sodium (ALEVE) 220 MG tablet Take 220-440 mg by mouth 2 (two) times daily as needed (pain.).   No current facility-administered medications for this visit. (Other)      REVIEW OF SYSTEMS:    ALLERGIES Allergies  Allergen Reactions   Codeine Nausea Only    PAST MEDICAL HISTORY Past Medical History:  Diagnosis Date   Epistaxis    Glaucoma    High cholesterol    Neuropathy    Past Surgical History:  Procedure Laterality Date   BACK SURGERY     CATARACT EXTRACTION Bilateral 2007   COLONOSCOPY  11/04/2007   Dr. Lindalou Rosales, normal exam.  Recommended repeat colonoscopy in 10 years.   COLONOSCOPY WITH PROPOFOL N/A 02/23/2020   Procedure: COLONOSCOPY WITH PROPOFOL;  Surgeon: Roberto Harman, DO;  [facility];  Service: Endoscopy;  Laterality: N/A;  7:30am   NASAL ENDOSCOPY WITH EPISTAXIS CONTROL Right 10/11/2017   Procedure: NASAL CAUTERY AND  BIOPSY OF RIGHT NASAL GRANULATION TISSUE;  Surgeon: Roberto Baptist, MD;  Location: [facility];  Service:  ENT;  Laterality: Right;   NASAL ENDOSCOPY WITH EPISTAXIS CONTROL Right 10/22/2017   Procedure: NASAL ENDOSCOPY WITH RIGHT EPISTAXIS CONTROL;  Surgeon: Roberto Baptist, MD;  Location: [facility];  Service: ENT;  Laterality: Right;   POLYPECTOMY  02/23/2020   Procedure: POLYPECTOMY;  Surgeon: Roberto Harman, DO;  [facility];  Service: Endoscopy;;   TONSILLECTOMY  1950    FAMILY HISTORY Family History  Problem Relation Age of Onset   Cerebral aneurysm Mother    Heart disease Father    Stroke Father    Neuropathy Neg Hx    Colon cancer Neg Hx     SOCIAL HISTORY Social History   Tobacco Use   Smoking status: Never   Smokeless tobacco: Never  Vaping Use   Vaping Use: Never used   Substance Use Topics   Alcohol use: No   Drug use: No         OPHTHALMIC EXAM:  Base Eye Exam     Visual Acuity (ETDRS)       Right Left   Dist cc 20/25 20/100 -1   Dist ph cc  20/70 -1    Correction: Glasses         Tonometry (Tonopen, 1:10 PM)       Right Left   Pressure 11 12         Pupils       Pupils Dark Light Shape React APD   Right PERRL 6 5 Round Brisk None   Left PERRL 6 5 Round Brisk None         Visual Fields (Counting fingers)       Left Right    Full Full         Extraocular Movement       Right Left    Full Full         Neuro/Psych     Oriented x3: Yes         Dilation     Both eyes: 1.0% Mydriacyl, 2.5% Phenylephrine @ 1:10 PM           Slit Lamp and Fundus Exam     External Exam       Right Left   External Normal Normal         Slit Lamp Exam       Right Left   Lids/Lashes Normal Normal   Conjunctiva/Sclera White and quiet White and quiet   Cornea Clear Clear   Anterior Chamber Deep and quiet Deep and quiet   Iris Round and reactive Round and reactive   Lens Posterior chamber intraocular lens Posterior chamber intraocular lens   Anterior Vitreous Normal Normal         Fundus Exam       Right Left   Posterior Vitreous Normal Normal   Disc Normal Normal   C/D Ratio 0.45 0.45   Macula Epiretinal membrane with minor topographic change ILM striae Epiretinal membrane with severe topographic distortion and severe ILM striae   Vessels Normal Normal   Periphery Normal Normal            IMAGING AND PROCEDURES  Imaging and Procedures for 11/18/20  OCT, Retina - OU - Both Eyes       Right Eye Quality was good. Scan locations included subfoveal. Central Foveal Thickness: 374. Findings include epiretinal membrane, abnormal foveal contour.   Left Eye Quality was good. Scan locations included subfoveal. Central Foveal Thickness: 503. Progression has no  prior data. Findings include epiretinal  membrane, abnormal foveal contour.   Notes OD with minor epiretinal membrane and and macular distortion of the inner fovea yet with no acuity change  OS very severe macular thickening secondary to epiretinal membrane     Color Fundus Photography Optos - OU - Both Eyes       Right Eye Progression has no prior data. Macula : epiretinal membrane. Vessels : normal observations. Periphery : normal observations.   Left Eye Progression has no prior data. Macula : epiretinal membrane. Vessels : normal observations. Periphery : normal observations.              ASSESSMENT/PLAN:  Left epiretinal membrane The nature of macular pucker (epiretinal membrane ERM) was discussed with the patient as well as threshold criteria for vitrectomy surgery. I explained that in rare cases another surgery is needed to actually remove a second wrinkle should it regrow.  Most often, the epiretinal membrane and underlying wrinkled internal limiting membrane are removed with the first surgery, to accomplish the goals.   If the operative eye is Phakic (natural lens still present), cataract surgery is often recommended prior to Vitrectomy. This will enable the retina surgeon to have the best view during surgery and the patient to obtain optimal results in the future. Treatment options were discussed.  With severe visual acuity impact left eye thickening all indicating the requirement and the recommendation for vitrectomy membrane peel left eye under local MAC anesthesia.  The patient does have glaucoma thus we will use not prednisolone acetate but rather Lotemax topical mild steroid formulation so as not to exacerbate glaucoma during the topical medication usage  Right epiretinal membrane Minor epiretinal membrane with with a minor inner foveal distortion with no impact on acuity in the right eye.  I do recommend observe only  Glaucoma Patient instructed to continue on current glaucoma medications as directed by  Dr. [name]     ICD-10-CM   1. Left epiretinal membrane  H35.372 OCT, Retina - OU - Both Eyes    Color Fundus Photography Optos - OU - Both Eyes    2. Right epiretinal membrane  H35.371 OCT, Retina - OU - Both Eyes    Color Fundus Photography Optos - OU - Both Eyes    3. Primary open angle glaucoma (POAG) of both eyes, moderate stage  H40.1132       1.  Patient would like to proceed with with surgical repair of the left eye, via vitrectomy membrane peel.  2.  At the time of preoperative evaluation and completion of paperwork and consent, we will use topical ofloxacin 1 drop left eye 4 times daily to the left eye as well as Lotemax 1 drop left eye 4 times daily.  We will avoid topical prednisone if at all possible OS to avoid the potential for steroid-induced worsening of of the intraocular pressure, glaucoma although it would only be on a temporary basis  3.  Ophthalmic Meds Ordered this visit:  No orders of the defined types were placed in this encounter.      Return ,, [place] as per timing of patient request, for Will schedule vitrectomy membrane [phone] left eye, OS.  There are no Patient Instructions on file for this visit.   Explained the diagnoses, plan, and follow up with the patient and they expressed understanding.  Patient expressed understanding of the importance of proper follow up care.   Clent Demark Khristen Cheyney M.D. Diseases &  Surgery of the Retina and Vitreous [facility] [place] 11/18/20     Abbreviations: M myopia (nearsighted); A astigmatism; H hyperopia (farsighted); P presbyopia; Mrx spectacle prescription;  CTL contact lenses; OD right eye; OS left eye; OU both eyes  XT exotropia; ET esotropia; PEK punctate epithelial keratitis; PEE punctate epithelial erosions; DES dry eye syndrome; MGD meibomian gland dysfunction; ATs artificial tears; PFAT's preservative free artificial tears; [place] nuclear sclerotic cataract; PSC  posterior subcapsular cataract; ERM epi-retinal membrane; PVD posterior vitreous detachment; RD retinal detachment; DM diabetes mellitus; DR diabetic retinopathy; NPDR non-proliferative diabetic retinopathy; PDR proliferative diabetic retinopathy; CSME clinically significant macular edema; DME diabetic macular edema; dbh dot blot hemorrhages; CWS cotton wool spot; POAG primary open angle glaucoma; C/D cup-to-disc ratio; HVF humphrey visual field; GVF goldmann visual field; OCT optical coherence tomography; IOP intraocular pressure; BRVO Branch retinal vein occlusion; CRVO central retinal vein occlusion; CRAO central retinal artery occlusion; BRAO branch retinal artery occlusion; RT retinal tear; SB scleral buckle; PPV pars plana vitrectomy; VH Vitreous hemorrhage; PRP panretinal laser photocoagulation; IVK intravitreal kenalog; VMT vitreomacular traction; MH Macular hole;  NVD neovascularization of the disc; NVE neovascularization elsewhere; AREDS age related eye disease study; ARMD age related macular degeneration; POAG primary open angle glaucoma; EBMD epithelial/anterior basement membrane dystrophy; ACIOL anterior chamber intraocular lens; IOL intraocular lens; PCIOL posterior chamber intraocular lens; Phaco/IOL phacoemulsification with intraocular lens placement; [place] photorefractive keratectomy; LASIK laser assisted in situ keratomileusis; HTN hypertension; DM diabetes mellitus; COPD chronic obstructive pulmonary disease

## 2020-11-24 DIAGNOSIS — E7800 Pure hypercholesterolemia, unspecified: Secondary | ICD-10-CM | POA: Diagnosis not present

## 2020-11-24 DIAGNOSIS — I10 Essential (primary) hypertension: Secondary | ICD-10-CM | POA: Diagnosis not present

## 2020-11-24 DIAGNOSIS — R739 Hyperglycemia, unspecified: Secondary | ICD-10-CM | POA: Diagnosis not present

## 2020-11-24 DIAGNOSIS — E7801 Familial hypercholesterolemia: Secondary | ICD-10-CM | POA: Diagnosis not present

## 2020-11-24 DIAGNOSIS — R7989 Other specified abnormal findings of blood chemistry: Secondary | ICD-10-CM | POA: Diagnosis not present

## 2020-11-29 DIAGNOSIS — Z6826 Body mass index (BMI) 26.0-26.9, adult: Secondary | ICD-10-CM | POA: Diagnosis not present

## 2020-11-29 DIAGNOSIS — H409 Unspecified glaucoma: Secondary | ICD-10-CM | POA: Diagnosis not present

## 2020-11-29 DIAGNOSIS — R7301 Impaired fasting glucose: Secondary | ICD-10-CM | POA: Diagnosis not present

## 2020-11-29 DIAGNOSIS — I10 Essential (primary) hypertension: Secondary | ICD-10-CM | POA: Diagnosis not present

## 2020-11-29 DIAGNOSIS — G629 Polyneuropathy, unspecified: Secondary | ICD-10-CM | POA: Diagnosis not present

## 2020-11-29 DIAGNOSIS — Z23 Encounter for immunization: Secondary | ICD-10-CM | POA: Diagnosis not present

## 2020-11-29 DIAGNOSIS — E7849 Other hyperlipidemia: Secondary | ICD-10-CM | POA: Diagnosis not present

## 2020-12-01 ENCOUNTER — Other Ambulatory Visit: Payer: Self-pay

## 2020-12-01 ENCOUNTER — Ambulatory Visit (INDEPENDENT_AMBULATORY_CARE_PROVIDER_SITE_OTHER): Payer: PPO

## 2020-12-01 ENCOUNTER — Encounter (INDEPENDENT_AMBULATORY_CARE_PROVIDER_SITE_OTHER): Payer: Self-pay

## 2020-12-01 DIAGNOSIS — H35372 Puckering of macula, left eye: Secondary | ICD-10-CM

## 2020-12-01 MED ORDER — PREDNISOLONE ACETATE 1 % OP SUSP: 1.0000 [drp] | Freq: Four times a day (QID) | OPHTHALMIC | 0 refills | Status: AC

## 2020-12-01 MED ORDER — OFLOXACIN 0.3 % OP SOLN: 1.0000 [drp] | Freq: Four times a day (QID) | OPHTHALMIC | 0 refills | Status: AC

## 2020-12-01 NOTE — Progress Notes (Signed)
12/01/2020     CHIEF COMPLAINT Patient presents for Pre-op Exam (Pt is here for pre-op. Pt will have PPV/Membrane Peel OS on 12/08/2020./Pt states, "My vision is still very blurred but no change that I can tell."/Pt reports still using Cosopt BID OS and Latanoprost QHS OU/)   HISTORY OF PRESENT ILLNESS: Roberto Rosales is a 75 y.o. male who presents to the clinic today for:   HPI     Pre-op Exam           Comments: Pt is here for pre-op. Pt will have PPV/Membrane Peel OS on 12/08/2020. Pt states, "My vision is still very blurred but no change that I can tell." Pt reports still using Cosopt BID OS and Latanoprost QHS OU        Last edited by Kendra Opitz, COA on 12/01/2020  1:28 PM.        HISTORICAL INFORMATION:   Selected notes from the [facility]    Lab Results  Component Value Date   HGBA1C 5.4 02/22/2017     CURRENT MEDICATIONS: Current Outpatient Medications (Ophthalmic Drugs)  Medication Sig   dorzolamide-timolol (COSOPT) 22.3-6.8 MG/ML ophthalmic solution Place 1 drop into the left eye 2 (two) times daily.   latanoprost (XALATAN) 0.005 % ophthalmic solution Place 1 drop into both eyes at bedtime.    No current facility-administered medications for this visit. (Ophthalmic Drugs)   Current Outpatient Medications (Other)  Medication Sig   acetaminophen (TYLENOL) 500 MG tablet Take 500-1,000 mg by mouth every 6 (six) hours as needed (for pain.).   atorvastatin (LIPITOR) 10 MG tablet Take 5 mg by mouth at bedtime.    Multiple Vitamin (MULTIVITAMIN WITH MINERALS) TABS tablet Take 1 tablet by mouth daily.   naproxen sodium (ALEVE) 220 MG tablet Take 220-440 mg by mouth 2 (two) times daily as needed (pain.).   No current facility-administered medications for this visit. (Other)     ALLERGIES Allergies  Allergen Reactions   Codeine Nausea Only    PAST MEDICAL HISTORY Past Medical History:  Diagnosis Date   Epistaxis    Glaucoma    High  cholesterol    Neuropathy    Past Surgical History:  Procedure Laterality Date   BACK SURGERY     CATARACT EXTRACTION Bilateral 2007   COLONOSCOPY  11/04/2007   Dr. [name], normal exam.  Recommended repeat colonoscopy in 10 years.   COLONOSCOPY WITH PROPOFOL N/A 02/23/2020   Procedure: COLONOSCOPY WITH PROPOFOL;  Surgeon: [name], DO;  [facility];  Service: Endoscopy;  Laterality: N/A;  7:30am   NASAL ENDOSCOPY WITH EPISTAXIS CONTROL Right 10/11/2017   Procedure: NASAL CAUTERY AND  BIOPSY OF RIGHT NASAL GRANULATION TISSUE;  Surgeon: [name], MD;  Location: [facility];  Service: ENT;  Laterality: Right;   NASAL ENDOSCOPY WITH EPISTAXIS CONTROL Right 10/22/2017   Procedure: NASAL ENDOSCOPY WITH RIGHT EPISTAXIS CONTROL;  Surgeon: [name], MD;  Location: [facility];  Service: ENT;  Laterality: Right;   POLYPECTOMY  02/23/2020   Procedure: POLYPECTOMY;  Surgeon: [name], DO;  [facility];  Service: Endoscopy;;   TONSILLECTOMY  1950    FAMILY HISTORY Family History  Problem Relation Age of Onset   Cerebral aneurysm Mother    Heart disease Father    Stroke Father    Neuropathy Neg Hx    Colon cancer Neg Hx     SOCIAL HISTORY Social History   Tobacco  Use   Smoking status: Never   Smokeless tobacco: Never  Vaping Use   Vaping Use: Never used  Substance Use Topics   Alcohol use: No   Drug use: No         OPHTHALMIC EXAM:  Base Eye Exam     Visual Acuity     ETDRS         Pupils       Pupils Dark Light Shape React APD   Right PERRL 6 5 Round Brisk None   Left PERRL 6 5 Round Brisk None         Visual Fields (Counting fingers)       Left Right    Full Full         Extraocular Movement       Right Left    Full Full         Neuro/Psych     Oriented x3: Yes         Dilation     Both eyes: No Dilation @ 1:28 PM           Slit Lamp and Fundus Exam     External Exam       Right  Left   External Normal Normal         Slit Lamp Exam       Right Left   Lids/Lashes Normal Normal   Conjunctiva/Sclera White and quiet White and quiet   Cornea Clear Clear   Anterior Chamber Deep and quiet Deep and quiet   Iris Round and reactive Round and reactive   Lens Posterior chamber intraocular lens Posterior chamber intraocular lens   Anterior Vitreous Normal Normal         Fundus Exam       Right Left   Posterior Vitreous Normal Normal   Disc Normal Normal   C/D Ratio 0.45 0.45   Macula Epiretinal membrane with minor topographic change ILM striae Epiretinal membrane with severe topographic distortion and severe ILM striae   Vessels Normal Normal   Periphery Normal Normal            IMAGING AND PROCEDURES  Imaging and Procedures for '@TODAY'$ @           ASSESSMENT/PLAN:  No diagnosis found.  Ophthalmic Meds Ordered this visit:  No orders of the defined types were placed in this encounter.       Pre-op completed. Operative consent obtained with pre-op eye drops reviewed with Wallie Renshaw and sent via [facility] as needed. Post op instructions reviewed with patient and per patient all questions answered.  [place], COA

## 2020-12-08 ENCOUNTER — Encounter (AMBULATORY_SURGERY_CENTER): Payer: PPO | Admitting: Ophthalmology

## 2020-12-08 DIAGNOSIS — H35372 Puckering of macula, left eye: Secondary | ICD-10-CM | POA: Diagnosis not present

## 2020-12-09 ENCOUNTER — Ambulatory Visit (INDEPENDENT_AMBULATORY_CARE_PROVIDER_SITE_OTHER): Payer: PPO | Admitting: Ophthalmology

## 2020-12-09 ENCOUNTER — Other Ambulatory Visit: Payer: Self-pay

## 2020-12-09 ENCOUNTER — Encounter (INDEPENDENT_AMBULATORY_CARE_PROVIDER_SITE_OTHER): Payer: Self-pay | Admitting: Ophthalmology

## 2020-12-09 DIAGNOSIS — H35372 Puckering of macula, left eye: Secondary | ICD-10-CM

## 2020-12-09 NOTE — Patient Instructions (Signed)

## 2020-12-09 NOTE — Progress Notes (Signed)
12/09/2020     CHIEF COMPLAINT Patient presents for Post-op Follow-up (1 day post op os sx 12/08/2020/Pt states, "Everything seemed to have went well. I am not having any issues at all really."/)   HISTORY OF PRESENT ILLNESS: Roberto Rosales is a 75 y.o. male who presents to the clinic today for:   HPI     Post-op Follow-up           Laterality: left eye   Discomfort: Negative for pain, itching and foreign body sensation   Vision: is blurred at distance   Comments: 1 day post op os sx 12/08/2020 Pt states, "Everything seemed to have went well. I am not having any issues at all really."        Last edited by Kendra Opitz, COA on 12/09/2020  8:58 AM.      Referring physician: Practice, [facility] [place],  [place] [address]  HISTORICAL INFORMATION:   Selected notes from the MEDICAL RECORD NUMBER    Lab Results  Component Value Date   HGBA1C 5.4 02/22/2017     CURRENT MEDICATIONS: Current Outpatient Medications (Ophthalmic Drugs)  Medication Sig   dorzolamide-timolol (COSOPT) 22.3-6.8 MG/ML ophthalmic solution Place 1 drop into the left eye 2 (two) times daily.   latanoprost (XALATAN) 0.005 % ophthalmic solution Place 1 drop into both eyes at bedtime.    No current facility-administered medications for this visit. (Ophthalmic Drugs)   Current Outpatient Medications (Other)  Medication Sig   acetaminophen (TYLENOL) 500 MG tablet Take 500-1,000 mg by mouth every 6 (six) hours as needed (for pain.).   atorvastatin (LIPITOR) 10 MG tablet Take 5 mg by mouth at bedtime.    Multiple Vitamin (MULTIVITAMIN WITH MINERALS) TABS tablet Take 1 tablet by mouth daily.   naproxen sodium (ALEVE) 220 MG tablet Take 220-440 mg by mouth 2 (two) times daily as needed (pain.).   No current facility-administered medications for this visit. (Other)      REVIEW OF SYSTEMS:    ALLERGIES Allergies  Allergen Reactions   Codeine Nausea Only    PAST MEDICAL  HISTORY Past Medical History:  Diagnosis Date   Epistaxis    Glaucoma    High cholesterol    Neuropathy    Past Surgical History:  Procedure Laterality Date   BACK SURGERY     CATARACT EXTRACTION Bilateral 2007   COLONOSCOPY  11/04/2007   Dr. [name], normal exam.  Recommended repeat colonoscopy in 10 years.   COLONOSCOPY WITH PROPOFOL N/A 02/23/2020   Procedure: COLONOSCOPY WITH PROPOFOL;  Surgeon: [name], DO;  [facility];  Service: Endoscopy;  Laterality: N/A;  7:30am   NASAL ENDOSCOPY WITH EPISTAXIS CONTROL Right 10/11/2017   Procedure: NASAL CAUTERY AND  BIOPSY OF RIGHT NASAL GRANULATION TISSUE;  Surgeon: [name], MD;  Location: [place];  Service: ENT;  Laterality: Right;   NASAL ENDOSCOPY WITH EPISTAXIS CONTROL Right 10/22/2017   Procedure: NASAL ENDOSCOPY WITH RIGHT EPISTAXIS CONTROL;  Surgeon: [name], MD;  Location: [place];  Service: ENT;  Laterality: Right;   POLYPECTOMY  02/23/2020   Procedure: POLYPECTOMY;  Surgeon: [name], DO;  [facility]: [facility];  Service: Endoscopy;;   TONSILLECTOMY  1950    FAMILY HISTORY Family History  Problem Relation Age of Onset   Cerebral aneurysm Mother    Heart disease Father    Stroke Father    Neuropathy Neg Hx    Colon cancer  Neg Hx     SOCIAL HISTORY Social History   Tobacco Use   Smoking status: Never   Smokeless tobacco: Never  Vaping Use   Vaping Use: Never used  Substance Use Topics   Alcohol use: No   Drug use: No         OPHTHALMIC EXAM:  Base Eye Exam     Visual Acuity (ETDRS)       Right Left   Dist cc 20/25 20/80 -1   Dist ph cc  NI    Correction: Glasses         Tonometry (Tonopen, 9:02 AM)       Right Left   Pressure N/A 16         Pupils       Dark Light Shape React APD   Right   Round Brisk None   Left 7 7 Round Minimal Dilated         Neuro/Psych     Oriented x3: Yes         Dilation     Left eye: 1.0%  Mydriacyl, 2.5% Phenylephrine @ 9:02 AM           Slit Lamp and Fundus Exam     External Exam       Right Left   External Normal Normal         Slit Lamp Exam       Right Left   Lids/Lashes Normal Normal   Conjunctiva/Sclera White and quiet White and quiet   Cornea Clear Clear   Anterior Chamber Deep and quiet Deep and quiet   Iris Round and reactive Round and reactive   Lens Posterior chamber intraocular lens Posterior chamber intraocular lens   Anterior Vitreous Normal Normal         Fundus Exam       Right Left   Posterior Vitreous Normal Clear, avitric   Disc Normal Normal   C/D Ratio 0.45 0.45   Macula Epiretinal membrane with minor topographic change ILM striae No topographic distortion with 20 diopter lens   Vessels Normal Normal   Periphery Normal Normal            IMAGING AND PROCEDURES  Imaging and Procedures for 12/09/20           ASSESSMENT/PLAN:  Left epiretinal membrane Postop day #1 looks great no topographic distortion to the fovea     ICD-10-CM   1. Left epiretinal membrane  H35.372       1.  OS looks great postop day #1.  Good acuity.  2.  Notations reviewed with the patient.  Postoperative positioning is not required.  3.  Patient struck not to compress mash or rub the eye  Ophthalmic Meds Ordered this visit:  No orders of the defined types were placed in this encounter.      Return in about 1 week (around 12/16/2020) for dilate, OS, POST OP, OCT.  Patient Instructions  Ofloxacin  4 times daily to the operative eye  Prednisolone acetate 1 drop to the operative eye 4 times daily  Patient instructed not to refill the medications and use them for maximum of 3 weeks.  Patient instructed do not rub the eye.  Patient has the option to use the patch at night.   No lifting and bending for 1 week. No water IN the eye for 10 days. Do not rub the eye. Wear shield at night for 1-3 days.  Continue your topical medications  for a total of 3 weeks.  Do not refill your postoperative medications unless instructed.  Refrain from exercise or intentional activity which increases our heart rate above resting levels.  Normal walking to complete normal activities of your day are appropriate.  Driving:  Legally, you only need one good eye, of 20/40 or better to drive.  However, the practice does not recommend driving during first weeks after surgery, IF you are uncomfortable with your visual functioning or capabilities.   If you have known sleep apnea, wear your CPAP as you normally should.    Explained the diagnoses, plan, and follow up with the patient and they expressed understanding.  Patient expressed understanding of the importance of proper follow up care.   Clent Demark Glendal Cassaday M.D. Diseases & Surgery of the Retina and Vitreous Retina & Diabetic [place] 12/09/20     Abbreviations: M myopia (nearsighted); A astigmatism; H hyperopia (farsighted); P presbyopia; Mrx spectacle prescription;  CTL contact lenses; OD right eye; OS left eye; OU both eyes  XT exotropia; ET esotropia; PEK punctate epithelial keratitis; PEE punctate epithelial erosions; DES dry eye syndrome; MGD meibomian gland dysfunction; ATs artificial tears; PFAT's preservative free artificial tears; [place] nuclear sclerotic cataract; PSC posterior subcapsular cataract; ERM epi-retinal membrane; PVD posterior vitreous detachment; RD retinal detachment; DM diabetes mellitus; DR diabetic retinopathy; NPDR non-proliferative diabetic retinopathy; PDR proliferative diabetic retinopathy; CSME clinically significant macular edema; DME diabetic macular edema; dbh dot blot hemorrhages; CWS cotton wool spot; POAG primary open angle glaucoma; C/D cup-to-disc ratio; HVF humphrey visual field; GVF goldmann visual field; OCT optical coherence tomography; IOP intraocular pressure; BRVO Branch retinal vein occlusion; CRVO central retinal vein occlusion; CRAO central retinal  artery occlusion; BRAO branch retinal artery occlusion; RT retinal tear; SB scleral buckle; PPV pars plana vitrectomy; VH Vitreous hemorrhage; PRP panretinal laser photocoagulation; IVK intravitreal kenalog; VMT vitreomacular traction; MH Macular hole;  NVD neovascularization of the disc; NVE neovascularization elsewhere; AREDS age related eye disease study; ARMD age related macular degeneration; POAG primary open angle glaucoma; EBMD epithelial/anterior basement membrane dystrophy; ACIOL anterior chamber intraocular lens; IOL intraocular lens; PCIOL posterior chamber intraocular lens; Phaco/IOL phacoemulsification with intraocular lens placement; [place] photorefractive keratectomy; LASIK laser assisted in situ keratomileusis; HTN hypertension; DM diabetes mellitus; COPD chronic obstructive pulmonary disease

## 2020-12-09 NOTE — Assessment & Plan Note (Signed)
Postop day #1 looks great no topographic distortion to the fovea

## 2020-12-15 ENCOUNTER — Other Ambulatory Visit: Payer: Self-pay

## 2020-12-15 ENCOUNTER — Ambulatory Visit (INDEPENDENT_AMBULATORY_CARE_PROVIDER_SITE_OTHER): Payer: PPO | Admitting: Ophthalmology

## 2020-12-15 ENCOUNTER — Encounter (INDEPENDENT_AMBULATORY_CARE_PROVIDER_SITE_OTHER): Payer: Self-pay | Admitting: Ophthalmology

## 2020-12-15 DIAGNOSIS — H35372 Puckering of macula, left eye: Secondary | ICD-10-CM | POA: Diagnosis not present

## 2020-12-15 NOTE — Patient Instructions (Signed)
Patient instructed to use the the topical eyedrops left eye for only 2 more weeks and do not refill them if he has completed them prior to that time.  Do not continue to use them beyond 2 weeks.  All activities are allowed in 4 more days.

## 2020-12-15 NOTE — Progress Notes (Signed)
12/15/2020     CHIEF COMPLAINT Patient presents for Post-op Follow-up (1 week post op os oct - sx was 12/08/2020/Pt states, "I am doing very well. My vision seems to be about the same. I have not had any discomfort or irritation."/Pt reports using Ofloxacin and Pred QID OS in conjunction with his two glaucoma gtts as well./)   HISTORY OF PRESENT ILLNESS: Roberto Rosales is a 75 y.o. male who presents to the clinic today for:   HPI     Post-op Follow-up           Laterality: left eye   Discomfort: Negative for pain, itching and foreign body sensation   Vision: is stable   Comments: 1 week post op os oct - sx was 12/08/2020 Pt states, "I am doing very well. My vision seems to be about the same. I have not had any discomfort or irritation." Pt reports using Ofloxacin and Pred QID OS in conjunction with his two glaucoma gtts as well.        Last edited by Kendra Opitz, COA on 12/15/2020  9:12 AM.      Referring physician: Practice, [facility] [place],  [place] [address]  HISTORICAL INFORMATION:   Selected notes from the MEDICAL RECORD NUMBER    Lab Results  Component Value Date   HGBA1C 5.4 02/22/2017     CURRENT MEDICATIONS: Current Outpatient Medications (Ophthalmic Drugs)  Medication Sig   dorzolamide-timolol (COSOPT) 22.3-6.8 MG/ML ophthalmic solution Place 1 drop into the left eye 2 (two) times daily.   latanoprost (XALATAN) 0.005 % ophthalmic solution Place 1 drop into both eyes at bedtime.    No current facility-administered medications for this visit. (Ophthalmic Drugs)   Current Outpatient Medications (Other)  Medication Sig   acetaminophen (TYLENOL) 500 MG tablet Take 500-1,000 mg by mouth every 6 (six) hours as needed (for pain.).   atorvastatin (LIPITOR) 10 MG tablet Take 5 mg by mouth at bedtime.    Multiple Vitamin (MULTIVITAMIN WITH MINERALS) TABS tablet Take 1 tablet by mouth daily.   naproxen sodium (ALEVE) 220 MG tablet Take 220-440  mg by mouth 2 (two) times daily as needed (pain.).   No current facility-administered medications for this visit. (Other)      REVIEW OF SYSTEMS:    ALLERGIES Allergies  Allergen Reactions   Codeine Nausea Only    PAST MEDICAL HISTORY Past Medical History:  Diagnosis Date   Epistaxis    Glaucoma    High cholesterol    Neuropathy    Past Surgical History:  Procedure Laterality Date   BACK SURGERY     CATARACT EXTRACTION Bilateral 2007   COLONOSCOPY  11/04/2007   Dr. [name], normal exam.  Recommended repeat colonoscopy in 10 years.   COLONOSCOPY WITH PROPOFOL N/A 02/23/2020   Procedure: COLONOSCOPY WITH PROPOFOL;  Surgeon: [name], DO;  [facility];  Service: Endoscopy;  Laterality: N/A;  7:30am   NASAL ENDOSCOPY WITH EPISTAXIS CONTROL Right 10/11/2017   Procedure: NASAL CAUTERY AND  BIOPSY OF RIGHT NASAL GRANULATION TISSUE;  Surgeon: [name], MD;  Location: [place];  Service: ENT;  Laterality: Right;   NASAL ENDOSCOPY WITH EPISTAXIS CONTROL Right 10/22/2017   Procedure: NASAL ENDOSCOPY WITH RIGHT EPISTAXIS CONTROL;  Surgeon: [name], MD;  Location: [place];  Service: ENT;  Laterality: Right;   POLYPECTOMY  02/23/2020   Procedure: POLYPECTOMY;  Surgeon: [name], DO;  [facility];  Service: Endoscopy;;   TONSILLECTOMY  1950    FAMILY HISTORY Family History  Problem Relation Age of Onset   Cerebral aneurysm Mother    Heart disease Father    Stroke Father    Neuropathy Neg Hx    Colon cancer Neg Hx     SOCIAL HISTORY Social History   Tobacco Use   Smoking status: Never   Smokeless tobacco: Never  Vaping Use   Vaping Use: Never used  Substance Use Topics   Alcohol use: No   Drug use: No         OPHTHALMIC EXAM:  Base Eye Exam     Visual Acuity (ETDRS)       Right Left   Dist cc 20/20 -1 20/40   Dist ph cc  NI         Tonometry (Tonopen, 9:19 AM)       Right Left   Pressure 15 12          Pupils       Pupils Dark Light Shape React APD   Right PERRL 6 5 Round Brisk None   Left PERRL 6 5 Round Brisk None         Visual Fields (Counting fingers)       Left Right    Full Full         Extraocular Movement       Right Left    Full Full         Neuro/Psych     Oriented x3: Yes         Dilation     Left eye: 1.0% Mydriacyl, 2.5% Phenylephrine @ 9:19 AM           Slit Lamp and Fundus Exam     External Exam       Right Left   External Normal Normal         Slit Lamp Exam       Right Left   Lids/Lashes Normal Normal   Conjunctiva/Sclera White and quiet White and quiet   Cornea Clear Clear   Anterior Chamber Deep and quiet Deep and quiet   Iris Round and reactive Round and reactive   Lens Posterior chamber intraocular lens Posterior chamber intraocular lens   Anterior Vitreous Normal Normal         Fundus Exam       Right Left   Posterior Vitreous Normal Clear, avitric   Disc Normal Normal   C/D Ratio 0.45 0.45   Macula Epiretinal membrane with minor topographic change ILM striae No topographic distortion with 20 diopter lens   Vessels Normal Normal   Periphery Normal Normal            IMAGING AND PROCEDURES  Imaging and Procedures for 12/15/20  OCT, Retina - OU - Both Eyes       Right Eye Quality was good. Scan locations included subfoveal. Central Foveal Thickness: 374. Findings include epiretinal membrane, abnormal foveal contour.   Left Eye Quality was good. Scan locations included subfoveal. Central Foveal Thickness: 407. Progression has no prior data. Findings include epiretinal membrane, abnormal foveal contour.   Notes OD with minor epiretinal membrane and and macular distortion of the inner fovea yet with no acuity change  OS much less thickening OS post removal of precellular membrane, not the ILM, with improved acuity             ASSESSMENT/PLAN:  Left epiretinal membrane OS looks  great with improved acuity 1 week post vitrectomy epiretinal membrane removal only.  Patient instructed to use the the topical eyedrops left eye for only 2 more weeks and do not refill them if he has completed them prior to that time.  Do not continue to use them beyond 2 weeks.  All activities are allowed in 4 more days.     ICD-10-CM   1. Left epiretinal membrane  H35.372 OCT, Retina - OU - Both Eyes      1.  2.  3.  Ophthalmic Meds Ordered this visit:  No orders of the defined types were placed in this encounter.      Return in about 8 weeks (around 02/09/2021) for OCT, POST OP, OS, dilate.  Patient Instructions  Patient instructed to use the the topical eyedrops left eye for only 2 more weeks and do not refill them if he has completed them prior to that time.  Do not continue to use them beyond 2 weeks.  All activities are allowed in 4 more days.   Explained the diagnoses, plan, and follow up with the patient and they expressed understanding.  Patient expressed understanding of the importance of proper follow up care.   Clent Demark Hilda Rynders M.D. Diseases & Surgery of the Retina and Vitreous Retina & Diabetic [place] 12/15/20     Abbreviations: M myopia (nearsighted); A astigmatism; H hyperopia (farsighted); P presbyopia; Mrx spectacle prescription;  CTL contact lenses; OD right eye; OS left eye; OU both eyes  XT exotropia; ET esotropia; PEK punctate epithelial keratitis; PEE punctate epithelial erosions; DES dry eye syndrome; MGD meibomian gland dysfunction; ATs artificial tears; PFAT's preservative free artificial tears; [place] nuclear sclerotic cataract; PSC posterior subcapsular cataract; ERM epi-retinal membrane; PVD posterior vitreous detachment; RD retinal detachment; DM diabetes mellitus; DR diabetic retinopathy; NPDR non-proliferative diabetic retinopathy; PDR proliferative diabetic retinopathy; CSME clinically significant macular edema; DME diabetic macular edema; dbh  dot blot hemorrhages; CWS cotton wool spot; POAG primary open angle glaucoma; C/D cup-to-disc ratio; HVF humphrey visual field; GVF goldmann visual field; OCT optical coherence tomography; IOP intraocular pressure; BRVO Branch retinal vein occlusion; CRVO central retinal vein occlusion; CRAO central retinal artery occlusion; BRAO branch retinal artery occlusion; RT retinal tear; SB scleral buckle; PPV pars plana vitrectomy; VH Vitreous hemorrhage; PRP panretinal laser photocoagulation; IVK intravitreal kenalog; VMT vitreomacular traction; MH Macular hole;  NVD neovascularization of the disc; NVE neovascularization elsewhere; AREDS age related eye disease study; ARMD age related macular degeneration; POAG primary open angle glaucoma; EBMD epithelial/anterior basement membrane dystrophy; ACIOL anterior chamber intraocular lens; IOL intraocular lens; PCIOL posterior chamber intraocular lens; Phaco/IOL phacoemulsification with intraocular lens placement; [place] photorefractive keratectomy; LASIK laser assisted in situ keratomileusis; HTN hypertension; DM diabetes mellitus; COPD chronic obstructive pulmonary disease

## 2020-12-15 NOTE — Assessment & Plan Note (Signed)
OS looks great with improved acuity 1 week post vitrectomy epiretinal membrane removal only.  Patient instructed to use the the topical eyedrops left eye for only 2 more weeks and do not refill them if he has completed them prior to that time.  Do not continue to use them beyond 2 weeks.  All activities are allowed in 4 more days.

## 2020-12-21 ENCOUNTER — Ambulatory Visit (INDEPENDENT_AMBULATORY_CARE_PROVIDER_SITE_OTHER): Payer: PPO | Admitting: Ophthalmology

## 2020-12-21 ENCOUNTER — Encounter (INDEPENDENT_AMBULATORY_CARE_PROVIDER_SITE_OTHER): Payer: Self-pay | Admitting: Ophthalmology

## 2020-12-21 ENCOUNTER — Other Ambulatory Visit: Payer: Self-pay

## 2020-12-21 DIAGNOSIS — T380X5A Adverse effect of glucocorticoids and synthetic analogues, initial encounter: Secondary | ICD-10-CM

## 2020-12-21 DIAGNOSIS — H35372 Puckering of macula, left eye: Secondary | ICD-10-CM | POA: Diagnosis not present

## 2020-12-21 DIAGNOSIS — H4060X2 Glaucoma secondary to drugs, unspecified eye, moderate stage: Secondary | ICD-10-CM

## 2020-12-21 NOTE — Progress Notes (Signed)
12/21/2020     CHIEF COMPLAINT Patient presents for Eye Problem   HISTORY OF PRESENT ILLNESS: Roberto Rosales is a 75 y.o. male who presents to the clinic today for:   HPI   WIP- Pt had sx on 12/08/2020 Pt states, "I never have a headache, ever. Yesterday around lunch I got a very bad headache behind my left eye and it lasted all day. It was a dull ache right behind my eye. My vision got smoky looking through my left eye. Then I was woken up in the middle of the night by pain in my left eye again. It was so bad that I could not sleep. I did take Tylenol and it barely helped." Pt reports still using Ofloxacin and Pred QID OS and also his glaucoma gtts. Last edited by Kendra Opitz, COA on 12/21/2020  8:54 AM.      Referring physician: Practice, [facility] [place],  [place] [address]  HISTORICAL INFORMATION:   Selected notes from the MEDICAL RECORD NUMBER    Lab Results  Component Value Date   HGBA1C 5.4 02/22/2017     CURRENT MEDICATIONS: Current Outpatient Medications (Ophthalmic Drugs)  Medication Sig   dorzolamide-timolol (COSOPT) 22.3-6.8 MG/ML ophthalmic solution Place 1 drop into the left eye 2 (two) times daily.   latanoprost (XALATAN) 0.005 % ophthalmic solution Place 1 drop into both eyes at bedtime.    No current facility-administered medications for this visit. (Ophthalmic Drugs)   Current Outpatient Medications (Other)  Medication Sig   acetaminophen (TYLENOL) 500 MG tablet Take 500-1,000 mg by mouth every 6 (six) hours as needed (for pain.).   atorvastatin (LIPITOR) 10 MG tablet Take 5 mg by mouth at bedtime.    Multiple Vitamin (MULTIVITAMIN WITH MINERALS) TABS tablet Take 1 tablet by mouth daily.   naproxen sodium (ALEVE) 220 MG tablet Take 220-440 mg by mouth 2 (two) times daily as needed (pain.).   No current facility-administered medications for this visit. (Other)      REVIEW OF SYSTEMS:    ALLERGIES Allergies  Allergen Reactions    Codeine Nausea Only    PAST MEDICAL HISTORY Past Medical History:  Diagnosis Date   Epistaxis    Glaucoma    High cholesterol    Neuropathy    Past Surgical History:  Procedure Laterality Date   BACK SURGERY     CATARACT EXTRACTION Bilateral 2007   COLONOSCOPY  11/04/2007   Dr. [name], normal exam.  Recommended repeat colonoscopy in 10 years.   COLONOSCOPY WITH PROPOFOL N/A 02/23/2020   Procedure: COLONOSCOPY WITH PROPOFOL;  Surgeon: [name], DO;  [facility];  Service: Endoscopy;  Laterality: N/A;  7:30am   NASAL ENDOSCOPY WITH EPISTAXIS CONTROL Right 10/11/2017   Procedure: NASAL CAUTERY AND  BIOPSY OF RIGHT NASAL GRANULATION TISSUE;  Surgeon: [name], MD;  Location: [place];  Service: ENT;  Laterality: Right;   NASAL ENDOSCOPY WITH EPISTAXIS CONTROL Right 10/22/2017   Procedure: NASAL ENDOSCOPY WITH RIGHT EPISTAXIS CONTROL;  Surgeon: [name], MD;  Location: [place];  Service: ENT;  Laterality: Right;   POLYPECTOMY  02/23/2020   Procedure: POLYPECTOMY;  Surgeon: [name], DO;  [facility];  Service: Endoscopy;;   TONSILLECTOMY  1950    FAMILY HISTORY Family History  Problem Relation Age of Onset   Cerebral aneurysm Mother    Heart disease Father    Stroke Father    Neuropathy  Neg Hx    Colon cancer Neg Hx     SOCIAL HISTORY Social History   Tobacco Use   Smoking status: Never   Smokeless tobacco: Never  Vaping Use   Vaping Use: Never used  Substance Use Topics   Alcohol use: No   Drug use: No         OPHTHALMIC EXAM:  Base Eye Exam     Visual Acuity (ETDRS)       Right Left   Dist cc 20/20 -1 20/70   Dist ph cc  NI         Tonometry (Tonopen, 8:57 AM)       Right Left   Pressure 16 38         Tonometry #2 (Tonopen, 8:57 AM)       Right Left   Pressure  39         Pupils       Pupils Dark Light Shape React APD   Right PERRL 6 5 Round Brisk None   Left PERRL 6 5 Round  Brisk None         Visual Fields (Counting fingers)       Left Right    Full Full         Extraocular Movement       Right Left    Full Full         Neuro/Psych     Oriented x3: Yes         Dilation     Left eye:            Slit Lamp and Fundus Exam     External Exam       Right Left   External Normal Normal         Slit Lamp Exam       Right Left   Lids/Lashes Normal Normal   Conjunctiva/Sclera White and quiet White and quiet   Cornea Clear Clear   Anterior Chamber Deep and quiet Deep and quiet   Iris Round and reactive Round and reactive   Lens Posterior chamber intraocular lens Posterior chamber intraocular lens   Anterior Vitreous Normal Normal         Fundus Exam       Right Left   Posterior Vitreous Normal Clear, avitric   Disc Normal Normal, pink, perfused artery and vein   C/D Ratio 0.45 0.45   Macula Epiretinal membrane with minor topographic change ILM striae No topographic distortion with 20 diopter lens   Vessels Normal Normal   Periphery Normal Normal            IMAGING AND PROCEDURES  Imaging and Procedures for 12/21/20  OCT, Retina - OU - Both Eyes       Right Eye Quality was good. Scan locations included subfoveal. Central Foveal Thickness: 374. Findings include epiretinal membrane, abnormal foveal contour.   Left Eye Quality was good. Scan locations included subfoveal. Central Foveal Thickness: 390. Progression has no prior data. Findings include epiretinal membrane, abnormal foveal contour.   Notes OD with minor epiretinal membrane and and macular distortion of the inner fovea yet with no acuity change  OS much less thickening OS post removal of precellular membrane, not the ILM, with improved acuity             ASSESSMENT/PLAN:  Moderate stage steroid-induced glaucoma OS with elevation intraocular pressure gradual onset post surgery on the basis likely of steroid induced glaucoma.  Will  immediately asked patient to discontinue topical ofloxacin and prednisolone acetate specifically to stop the medication that has a pink or white top that appears like milk upon dropping the dropper out of the bottle  Left epiretinal membrane OS with macular condition still improving post vitrectomy membrane peel     ICD-10-CM   1. Left epiretinal membrane  H35.372 OCT, Retina - OU - Both Eyes    2. Moderate stage steroid-induced glaucoma  H40.60X2    T38.0X5A       1.  See patient instruction list patient will cease use of topical steroid prednisolone acetate left eye and ofloxacin while continuing on his normal glaucoma drops  2.  3.  Ophthalmic Meds Ordered this visit:  No orders of the defined types were placed in this encounter.      Return in about 1 week (around 12/28/2020) for IOP check TECH only, NO DILATE,, not on a Tuesday.  Patient Instructions  Patient instructed to discontinue ofloxacin (brown top or a beige top) as well as prednisolone acetate (white or pink top) this is the drop that is slowly inducing a secondary glaucoma worsening his current underlying glaucoma.  This will resolve spontaneously on its own over the coming 48 to 72 hours with cessation of topical steroid drop left eye   Explained the diagnoses, plan, and follow up with the patient and they expressed understanding.  Patient expressed understanding of the importance of proper follow up care.   Clent Demark Kadia Abaya M.D. Diseases & Surgery of the Retina and Vitreous [facility] [place] 12/21/20     Abbreviations: M myopia (nearsighted); A astigmatism; H hyperopia (farsighted); P presbyopia; Mrx spectacle prescription;  CTL contact lenses; OD right eye; OS left eye; OU both eyes  XT exotropia; ET esotropia; PEK punctate epithelial keratitis; PEE punctate epithelial erosions; DES dry eye syndrome; MGD meibomian gland dysfunction; ATs artificial tears; PFAT's preservative free artificial tears;  [place] nuclear sclerotic cataract; PSC posterior subcapsular cataract; ERM epi-retinal membrane; PVD posterior vitreous detachment; RD retinal detachment; DM diabetes mellitus; DR diabetic retinopathy; NPDR non-proliferative diabetic retinopathy; PDR proliferative diabetic retinopathy; CSME clinically significant macular edema; DME diabetic macular edema; dbh dot blot hemorrhages; CWS cotton wool spot; POAG primary open angle glaucoma; C/D cup-to-disc ratio; HVF humphrey visual field; GVF goldmann visual field; OCT optical coherence tomography; IOP intraocular pressure; BRVO Branch retinal vein occlusion; CRVO central retinal vein occlusion; CRAO central retinal artery occlusion; BRAO branch retinal artery occlusion; RT retinal tear; SB scleral buckle; PPV pars plana vitrectomy; VH Vitreous hemorrhage; PRP panretinal laser photocoagulation; IVK intravitreal kenalog; VMT vitreomacular traction; MH Macular hole;  NVD neovascularization of the disc; NVE neovascularization elsewhere; AREDS age related eye disease study; ARMD age related macular degeneration; POAG primary open angle glaucoma; EBMD epithelial/anterior basement membrane dystrophy; ACIOL anterior chamber intraocular lens; IOL intraocular lens; PCIOL posterior chamber intraocular lens; Phaco/IOL phacoemulsification with intraocular lens placement; [place] photorefractive keratectomy; LASIK laser assisted in situ keratomileusis; HTN hypertension; DM diabetes mellitus; COPD chronic obstructive pulmonary disease

## 2020-12-21 NOTE — Assessment & Plan Note (Signed)
OS with elevation intraocular pressure gradual onset post surgery on the basis likely of steroid induced glaucoma.  Will immediately asked patient to discontinue topical ofloxacin and prednisolone acetate specifically to stop the medication that has a pink or white top that appears like milk upon dropping the dropper out of the bottle

## 2020-12-21 NOTE — Patient Instructions (Signed)
Patient instructed to discontinue ofloxacin (brown top or a beige top) as well as prednisolone acetate (white or pink top) this is the drop that is slowly inducing a secondary glaucoma worsening his current underlying glaucoma.  This will resolve spontaneously on its own over the coming 48 to 72 hours with cessation of topical steroid drop left eye

## 2020-12-21 NOTE — Assessment & Plan Note (Signed)
OS with macular condition still improving post vitrectomy membrane peel

## 2020-12-29 ENCOUNTER — Ambulatory Visit (INDEPENDENT_AMBULATORY_CARE_PROVIDER_SITE_OTHER): Payer: PPO

## 2020-12-29 ENCOUNTER — Encounter (INDEPENDENT_AMBULATORY_CARE_PROVIDER_SITE_OTHER): Payer: Self-pay

## 2020-12-29 ENCOUNTER — Other Ambulatory Visit: Payer: Self-pay

## 2021-01-18 DIAGNOSIS — X32XXXA Exposure to sunlight, initial encounter: Secondary | ICD-10-CM | POA: Diagnosis not present

## 2021-01-18 DIAGNOSIS — L570 Actinic keratosis: Secondary | ICD-10-CM | POA: Diagnosis not present

## 2021-01-25 DIAGNOSIS — Z23 Encounter for immunization: Secondary | ICD-10-CM | POA: Diagnosis not present

## 2021-02-01 ENCOUNTER — Encounter (INDEPENDENT_AMBULATORY_CARE_PROVIDER_SITE_OTHER): Payer: Self-pay | Admitting: Ophthalmology

## 2021-02-01 ENCOUNTER — Ambulatory Visit (INDEPENDENT_AMBULATORY_CARE_PROVIDER_SITE_OTHER): Payer: PPO | Admitting: Ophthalmology

## 2021-02-01 ENCOUNTER — Other Ambulatory Visit: Payer: Self-pay

## 2021-02-01 DIAGNOSIS — H35372 Puckering of macula, left eye: Secondary | ICD-10-CM

## 2021-02-01 DIAGNOSIS — H35371 Puckering of macula, right eye: Secondary | ICD-10-CM | POA: Diagnosis not present

## 2021-02-01 DIAGNOSIS — H4060X2 Glaucoma secondary to drugs, unspecified eye, moderate stage: Secondary | ICD-10-CM

## 2021-02-01 DIAGNOSIS — T380X5A Adverse effect of glucocorticoids and synthetic analogues, initial encounter: Secondary | ICD-10-CM | POA: Diagnosis not present

## 2021-02-01 NOTE — Progress Notes (Signed)
02/01/2021     CHIEF COMPLAINT Patient presents for  Chief Complaint  Patient presents with   Post-op Follow-up      HISTORY OF PRESENT ILLNESS: Roberto Rosales is a 75 y.o. male who presents to the clinic today for:   HPI     Post-op Follow-up   In left eye.  Discomfort includes Negative for pain, itching and foreign body sensation.  Vision is improved.        Comments   8 week post op os oct sx on 12/08/2020, and also overall improved acuity since prior to surgery Pt states, "I think that everything is better since I had that little issue with my pressure. I am still using Latanoprost QHS OU and Dorzolamide BID OS. I think everything is good. I am seeing ok.":       Last edited by [name], MD on 02/01/2021 10:38 AM.      Referring physician: Practice, [facility] [place],  [place] [address]  HISTORICAL INFORMATION:   Selected notes from the MEDICAL RECORD NUMBER    Lab Results  Component Value Date   HGBA1C 5.4 02/22/2017     CURRENT MEDICATIONS: Current Outpatient Medications (Ophthalmic Drugs)  Medication Sig   dorzolamide-timolol (COSOPT) 22.3-6.8 MG/ML ophthalmic solution Place 1 drop into the left eye 2 (two) times daily.   latanoprost (XALATAN) 0.005 % ophthalmic solution Place 1 drop into both eyes at bedtime.    No current facility-administered medications for this visit. (Ophthalmic Drugs)   Current Outpatient Medications (Other)  Medication Sig   acetaminophen (TYLENOL) 500 MG tablet Take 500-1,000 mg by mouth every 6 (six) hours as needed (for pain.).   atorvastatin (LIPITOR) 10 MG tablet Take 5 mg by mouth at bedtime.    Multiple Vitamin (MULTIVITAMIN WITH MINERALS) TABS tablet Take 1 tablet by mouth daily.   naproxen sodium (ALEVE) 220 MG tablet Take 220-440 mg by mouth 2 (two) times daily as needed (pain.).   No current facility-administered medications for this visit. (Other)      REVIEW OF  SYSTEMS:    ALLERGIES Allergies  Allergen Reactions   Codeine Nausea Only    PAST MEDICAL HISTORY Past Medical History:  Diagnosis Date   Epistaxis    Glaucoma    High cholesterol    Neuropathy    Past Surgical History:  Procedure Laterality Date   BACK SURGERY     CATARACT EXTRACTION Bilateral 2007   COLONOSCOPY  11/04/2007   Dr. [name], normal exam.  Recommended repeat colonoscopy in 10 years.   COLONOSCOPY WITH PROPOFOL N/A 02/23/2020   Procedure: COLONOSCOPY WITH PROPOFOL;  Surgeon: [name], DO;  [facility];  Service: Endoscopy;  Laterality: N/A;  7:30am   NASAL ENDOSCOPY WITH EPISTAXIS CONTROL Right 10/11/2017   Procedure: NASAL CAUTERY AND  BIOPSY OF RIGHT NASAL GRANULATION TISSUE;  Surgeon: [name], MD;  Location: [place];  Service: ENT;  Laterality: Right;   NASAL ENDOSCOPY WITH EPISTAXIS CONTROL Right 10/22/2017   Procedure: NASAL ENDOSCOPY WITH RIGHT EPISTAXIS CONTROL;  Surgeon: [name], MD;  Location: [place];  Service: ENT;  Laterality: Right;   POLYPECTOMY  02/23/2020   Procedure: POLYPECTOMY;  Surgeon: [name], DO;  [facility];  Service: Endoscopy;;   TONSILLECTOMY  1950    FAMILY HISTORY Family History  Problem Relation Age of Onset   Cerebral aneurysm Mother    Heart disease Father  Stroke Father    Neuropathy Neg Hx    Colon cancer Neg Hx     SOCIAL HISTORY Social History   Tobacco Use   Smoking status: Never   Smokeless tobacco: Never  Vaping Use   Vaping Use: Never used  Substance Use Topics   Alcohol use: No   Drug use: No         OPHTHALMIC EXAM:  Base Eye Exam     Visual Acuity (ETDRS)       Right Left   Dist cc 20/30 -2 20/40 -2   Dist ph cc NI NI         Tonometry (Tonopen, 10:39 AM)       Right Left   Pressure 13 9         Pupils       Pupils Dark Light Shape React APD   Right PERRL 5 4 Round Brisk None   Left PERRL 5 4 Round Brisk None          Visual Fields (Counting fingers)       Left Right    Full Full         Extraocular Movement       Right Left    Full Full         Neuro/Psych     Oriented x3: Yes   Mood/Affect: Normal         Dilation     Left eye: 1.0% Mydriacyl, 2.5% Phenylephrine @ 10:04 AM           Slit Lamp and Fundus Exam     External Exam       Right Left   External Normal Normal         Slit Lamp Exam       Right Left   Lids/Lashes Normal Normal   Conjunctiva/Sclera White and quiet White and quiet   Cornea Clear Clear   Anterior Chamber Deep and quiet Deep and quiet   Iris Round and reactive Round and reactive   Lens Posterior chamber intraocular lens Posterior chamber intraocular lens   Anterior Vitreous Normal Normal         Fundus Exam       Right Left   Posterior Vitreous Normal Clear, avitric   Disc Normal Normal, pink, perfused artery and vein   C/D Ratio 0.45 0.45   Macula Epiretinal membrane with minor topographic change ILM striae No topographic distortion with 20 diopter lens   Vessels Normal Normal   Periphery Normal Normal            IMAGING AND PROCEDURES  Imaging and Procedures for 02/01/21  OCT, Retina - OU - Both Eyes       Right Eye Quality was good. Scan locations included subfoveal. Central Foveal Thickness: 369. Findings include epiretinal membrane, abnormal foveal contour.   Left Eye Quality was good. Scan locations included subfoveal. Central Foveal Thickness: 368. Progression has no prior data. Findings include abnormal foveal contour.   Notes OD with minor epiretinal membrane and and macular distortion of the inner fovea yet with no acuity change  OS much less thickening OS post removal of precellular membrane, not the ILM, with improved acuity, 503 m prior to surgery and now 360             ASSESSMENT/PLAN:  Right epiretinal membrane Minor by OCT and excellent acuity observe  Moderate stage steroid-induced  glaucoma Condition resolved off steroids topically OS  Left epiretinal  membrane OS looks great with vastly improved macular topography and improving acuity symptomatically and on the eye chart.  Will follow-up next in 6 months or as needed     ICD-10-CM   1. Left epiretinal membrane  H35.372 OCT, Retina - OU - Both Eyes    2. Right epiretinal membrane  H35.371     3. Moderate stage steroid-induced glaucoma  H40.60X2    T38.0X5A       1.  OS with improving macular topography some 8 weeks post vitrectomy membrane peel (ERM only).  2.  OD, minor epiretinal membrane with no impact on acuity observe  3.  Ophthalmic Meds Ordered this visit:  No orders of the defined types were placed in this encounter.      Return in about 6 months (around 08/01/2021) for DILATE OU, OCT.  There are no Patient Instructions on file for this visit.   Explained the diagnoses, plan, and follow up with the patient and they expressed understanding.  Patient expressed understanding of the importance of proper follow up care.   Clent Demark Seung Nidiffer M.D. Diseases & Surgery of the Retina and Vitreous [facility]ina [facility] Di[facility] [place] 02/01/21     Abbreviations: M myopia (nearsighted); A astigmatism; H hyperopia (farsighted); P presbyopia; Mrx spectacle prescription;  CTL contact lenses; OD right eye; OS left eye; OU both eyes  XT exotropia; ET esotropia; PEK punctate epithelial keratitis; PEE punctate epithelial erosions; DES dry eye syndrome; MGD meibomian gland dysfunction; ATs artificial tears; PFAT's preservative free artificial tears; [place] nuclear sclerotic cataract; PSC posterior subcapsular cataract; ERM epi-retinal membrane; PVD posterior vitreous detachment; RD retinal detachment; DM diabetes mellitus; DR diabetic retinopathy; NPDR non-proliferative diabetic retinopathy; PDR proliferative diabetic retinopathy; CSME clinically significant macular edema; DME diabetic macular edema; dbh dot blot hemorrhages; CWS  cotton wool spot; POAG primary open angle glaucoma; C/D cup-to-disc ratio; HVF humphrey visual field; GVF goldmann visual field; OCT optical coherence tomography; IOP intraocular pressure; BRVO Branch retinal vein occlusion; CRVO central retinal vein occlusion; CRAO central retinal artery occlusion; BRAO branch retinal artery occlusion; RT retinal tear; SB scleral buckle; PPV pars plana vitrectomy; VH Vitreous hemorrhage; PRP panretinal laser photocoagulation; IVK intravitreal kenalog; VMT vitreomacular traction; MH Macular hole;  NVD neovascularization of the disc; NVE neovascularization elsewhere; AREDS age related eye disease study; ARMD age related macular degeneration; POAG primary open angle glaucoma; EBMD epithelial/anterior basement membrane dystrophy; ACIOL anterior chamber intraocular lens; IOL intraocular lens; PCIOL posterior chamber intraocular lens; Phaco/IOL phacoemulsification with intraocular lens placement; [place] photorefractive keratectomy; LASIK laser assisted in situ keratomileusis; HTN hypertension; DM diabetes mellitus; COPD chronic obstructive pulmonary disease

## 2021-02-01 NOTE — Assessment & Plan Note (Signed)
OS looks great with vastly improved macular topography and improving acuity symptomatically and on the eye chart.  Will follow-up next in 6 months or as needed

## 2021-02-01 NOTE — Assessment & Plan Note (Signed)
Condition resolved off steroids topically OS

## 2021-02-01 NOTE — Assessment & Plan Note (Signed)
Minor by OCT and excellent acuity observe

## 2021-02-09 ENCOUNTER — Encounter (INDEPENDENT_AMBULATORY_CARE_PROVIDER_SITE_OTHER): Payer: PPO | Admitting: Ophthalmology

## 2021-03-10 DIAGNOSIS — M79674 Pain in right toe(s): Secondary | ICD-10-CM | POA: Diagnosis not present

## 2021-03-10 DIAGNOSIS — M79671 Pain in right foot: Secondary | ICD-10-CM | POA: Diagnosis not present

## 2021-03-10 DIAGNOSIS — L03031 Cellulitis of right toe: Secondary | ICD-10-CM | POA: Diagnosis not present

## 2021-03-10 DIAGNOSIS — L600 Ingrowing nail: Secondary | ICD-10-CM | POA: Diagnosis not present

## 2021-03-14 DIAGNOSIS — H401131 Primary open-angle glaucoma, bilateral, mild stage: Secondary | ICD-10-CM | POA: Diagnosis not present

## 2021-03-14 DIAGNOSIS — Z961 Presence of intraocular lens: Secondary | ICD-10-CM | POA: Diagnosis not present

## 2021-03-14 DIAGNOSIS — H524 Presbyopia: Secondary | ICD-10-CM | POA: Diagnosis not present

## 2021-03-15 DIAGNOSIS — L03031 Cellulitis of right toe: Secondary | ICD-10-CM | POA: Diagnosis not present

## 2021-03-15 DIAGNOSIS — M79671 Pain in right foot: Secondary | ICD-10-CM | POA: Diagnosis not present

## 2021-03-15 DIAGNOSIS — M79674 Pain in right toe(s): Secondary | ICD-10-CM | POA: Diagnosis not present

## 2021-04-06 DIAGNOSIS — R739 Hyperglycemia, unspecified: Secondary | ICD-10-CM | POA: Diagnosis not present

## 2021-04-06 DIAGNOSIS — E7800 Pure hypercholesterolemia, unspecified: Secondary | ICD-10-CM | POA: Diagnosis not present

## 2021-04-06 DIAGNOSIS — I10 Essential (primary) hypertension: Secondary | ICD-10-CM | POA: Diagnosis not present

## 2021-04-07 DIAGNOSIS — M79674 Pain in right toe(s): Secondary | ICD-10-CM | POA: Diagnosis not present

## 2021-04-07 DIAGNOSIS — M79671 Pain in right foot: Secondary | ICD-10-CM | POA: Diagnosis not present

## 2021-04-07 DIAGNOSIS — L03031 Cellulitis of right toe: Secondary | ICD-10-CM | POA: Diagnosis not present

## 2021-04-28 DIAGNOSIS — M79674 Pain in right toe(s): Secondary | ICD-10-CM | POA: Diagnosis not present

## 2021-04-28 DIAGNOSIS — L03031 Cellulitis of right toe: Secondary | ICD-10-CM | POA: Diagnosis not present

## 2021-04-28 DIAGNOSIS — M79671 Pain in right foot: Secondary | ICD-10-CM | POA: Diagnosis not present

## 2021-05-06 DIAGNOSIS — I10 Essential (primary) hypertension: Secondary | ICD-10-CM | POA: Diagnosis not present

## 2021-05-06 DIAGNOSIS — E7800 Pure hypercholesterolemia, unspecified: Secondary | ICD-10-CM | POA: Diagnosis not present

## 2021-05-06 DIAGNOSIS — R739 Hyperglycemia, unspecified: Secondary | ICD-10-CM | POA: Diagnosis not present

## 2021-05-31 DIAGNOSIS — E7849 Other hyperlipidemia: Secondary | ICD-10-CM | POA: Diagnosis not present

## 2021-05-31 DIAGNOSIS — E782 Mixed hyperlipidemia: Secondary | ICD-10-CM | POA: Diagnosis not present

## 2021-05-31 DIAGNOSIS — R7309 Other abnormal glucose: Secondary | ICD-10-CM | POA: Diagnosis not present

## 2021-06-01 DIAGNOSIS — Z0001 Encounter for general adult medical examination with abnormal findings: Secondary | ICD-10-CM | POA: Diagnosis not present

## 2021-06-01 DIAGNOSIS — I10 Essential (primary) hypertension: Secondary | ICD-10-CM | POA: Diagnosis not present

## 2021-06-01 DIAGNOSIS — E7849 Other hyperlipidemia: Secondary | ICD-10-CM | POA: Diagnosis not present

## 2021-06-01 DIAGNOSIS — D649 Anemia, unspecified: Secondary | ICD-10-CM | POA: Diagnosis not present

## 2021-06-02 DIAGNOSIS — H409 Unspecified glaucoma: Secondary | ICD-10-CM | POA: Diagnosis not present

## 2021-06-02 DIAGNOSIS — Z1389 Encounter for screening for other disorder: Secondary | ICD-10-CM | POA: Diagnosis not present

## 2021-06-02 DIAGNOSIS — I10 Essential (primary) hypertension: Secondary | ICD-10-CM | POA: Diagnosis not present

## 2021-06-02 DIAGNOSIS — E7849 Other hyperlipidemia: Secondary | ICD-10-CM | POA: Diagnosis not present

## 2021-06-02 DIAGNOSIS — R7301 Impaired fasting glucose: Secondary | ICD-10-CM | POA: Diagnosis not present

## 2021-06-02 DIAGNOSIS — G629 Polyneuropathy, unspecified: Secondary | ICD-10-CM | POA: Diagnosis not present

## 2021-06-02 DIAGNOSIS — Z1331 Encounter for screening for depression: Secondary | ICD-10-CM | POA: Diagnosis not present

## 2021-06-02 DIAGNOSIS — Z6826 Body mass index (BMI) 26.0-26.9, adult: Secondary | ICD-10-CM | POA: Diagnosis not present

## 2021-06-20 DIAGNOSIS — R509 Fever, unspecified: Secondary | ICD-10-CM | POA: Diagnosis not present

## 2021-06-20 DIAGNOSIS — Z20828 Contact with and (suspected) exposure to other viral communicable diseases: Secondary | ICD-10-CM | POA: Diagnosis not present

## 2021-06-20 DIAGNOSIS — Z6826 Body mass index (BMI) 26.0-26.9, adult: Secondary | ICD-10-CM | POA: Diagnosis not present

## 2021-06-22 ENCOUNTER — Emergency Department (HOSPITAL_COMMUNITY): Payer: PPO

## 2021-06-22 ENCOUNTER — Other Ambulatory Visit: Payer: Self-pay

## 2021-06-22 ENCOUNTER — Emergency Department (HOSPITAL_COMMUNITY)
Admission: EM | Admit: 2021-06-22 | Discharge: 2021-06-22 | Disposition: A | Discharge status: Home / Self Care | Payer: PPO | Attending: Emergency Medicine | Admitting: Emergency Medicine

## 2021-06-22 ENCOUNTER — Encounter (HOSPITAL_COMMUNITY): Payer: Self-pay

## 2021-06-22 DIAGNOSIS — U071 COVID-19: Secondary | ICD-10-CM | POA: Diagnosis not present

## 2021-06-22 DIAGNOSIS — R509 Fever, unspecified: Secondary | ICD-10-CM | POA: Insufficient documentation

## 2021-06-22 DIAGNOSIS — Z8616 Personal history of COVID-19: Secondary | ICD-10-CM | POA: Diagnosis not present

## 2021-06-22 DIAGNOSIS — R059 Cough, unspecified: Secondary | ICD-10-CM | POA: Diagnosis not present

## 2021-06-22 DIAGNOSIS — M791 Myalgia, unspecified site: Secondary | ICD-10-CM | POA: Diagnosis not present

## 2021-06-22 DIAGNOSIS — R069 Unspecified abnormalities of breathing: Secondary | ICD-10-CM | POA: Diagnosis not present

## 2021-06-22 DIAGNOSIS — R0602 Shortness of breath: Secondary | ICD-10-CM | POA: Diagnosis not present

## 2021-06-22 DIAGNOSIS — I517 Cardiomegaly: Secondary | ICD-10-CM | POA: Diagnosis not present

## 2021-06-22 DIAGNOSIS — Z8669 Personal history of other diseases of the nervous system and sense organs: Secondary | ICD-10-CM

## 2021-06-22 DIAGNOSIS — E7800 Pure hypercholesterolemia, unspecified: Secondary | ICD-10-CM

## 2021-06-22 DIAGNOSIS — R051 Acute cough: Secondary | ICD-10-CM

## 2021-06-22 LAB — CBG MONITORING, ED: Glucose-Capillary: 115 mg/dL — ABNORMAL HIGH (ref 70–99)

## 2021-06-22 MED ORDER — GUAIFENESIN 100 MG/5ML PO LIQD
15.0000 mL | Freq: Once | ORAL | Status: AC
  Administered 2021-06-22: 15 mL via ORAL
  Filled 2021-06-22: qty 15

## 2021-06-22 MED ORDER — HYDROCOD POLI-CHLORPHE POLI ER 10-8 MG/5ML PO SUER: 5.0000 mL | Freq: Two times a day (BID) | ORAL | 0 refills | Status: DC | PRN

## 2021-06-22 NOTE — ED Triage Notes (Signed)
Patient from home positive for COVID with complaints of cough. Patient will have an episode of coughing and become short of breath.

## 2021-06-22 NOTE — Discharge Instructions (Signed)
It was our pleasure to provide your ER care today - we hope that you feel better.  Drink plenty of fluids/stay well hydrated. You may take tussionex as need for cough, as prescribed - may make drowsy, no driving when taking.   Follow up with primary care doctor in two weeks if symptoms fail to improve/resolve.  Return to ER if worse, new symptoms, increased trouble breathing, or other concern.

## 2021-06-22 NOTE — ED Provider Notes (Signed)
[facility] EMERGENCY DEPARTMENT Provider Note   CSN: [id number] Arrival date & time: 06/22/21  1359     History  Chief Complaint  Patient presents with   Cough    Roberto Rosales is a 76 y.o. male.  Patient indicates c/o cough, generally non prod but occasional scant yellow sputum. Symptoms acute onset 3-4 days ago, did home covid test positive, saw pcp this week and covid test there positive, started on molnupiravir. States cough persists. Denies chest pain or sob. No abd pain or nvd. +body aches. Subj fever. No rash. No headache. No dysuria or gu c/o.   The history is provided by the patient and a significant other.  Cough Associated symptoms: myalgias   Associated symptoms: no chest pain, no fever, no headaches, no rash, no shortness of breath and no sore throat       Home Medications Prior to Admission medications   Medication Sig Start Date End Date Taking? Authorizing Provider  acetaminophen (TYLENOL) 500 MG tablet Take 500-1,000 mg by mouth every 6 (six) hours as needed (for pain.).    [provider]  atorvastatin (LIPITOR) 10 MG tablet Take 5 mg by mouth at bedtime.  08/16/16   [provider]  dorzolamide-timolol (COSOPT) 22.3-6.8 MG/ML ophthalmic solution Place 1 drop into the left eye 2 (two) times daily.    [provider]  latanoprost (XALATAN) 0.005 % ophthalmic solution Place 1 drop into both eyes at bedtime.  07/18/16   [provider]  Multiple Vitamin (MULTIVITAMIN WITH MINERALS) TABS tablet Take 1 tablet by mouth daily.    [provider]  naproxen sodium (ALEVE) 220 MG tablet Take 220-440 mg by mouth 2 (two) times daily as needed (pain.).    [provider]      Allergies    Codeine    Review of Systems   Review of Systems  Constitutional:  Negative for fever.  HENT:  Negative for sore throat.   Eyes:  Negative for redness.  Respiratory:  Positive for cough. Negative for shortness of breath.    Cardiovascular:  Negative for chest pain and leg swelling.  Gastrointestinal:  Negative for abdominal pain and vomiting.  Genitourinary:  Negative for dysuria and flank pain.  Musculoskeletal:  Positive for myalgias. Negative for neck pain and neck stiffness.  Skin:  Negative for rash.  Neurological:  Negative for headaches.  Hematological:  Does not bruise/bleed easily.  Psychiatric/Behavioral:  Negative for confusion.    Physical Exam Updated Vital Signs BP (!) 156/78 (BP Location: Right Arm)    Pulse 73    Resp 18    Ht 1.829 m (6')    Wt 86.2 kg    SpO2 97%    BMI 25.77 kg/m  Physical Exam Vitals and nursing note reviewed.  Constitutional:      Appearance: Normal appearance. He is well-developed.  HENT:     Head: Atraumatic.     Nose: Nose normal.     Mouth/Throat:     Mouth: Mucous membranes are moist.     Pharynx: Oropharynx is clear.  Eyes:     General: No scleral icterus.    Conjunctiva/sclera: Conjunctivae normal.  Neck:     Trachea: No tracheal deviation.  Cardiovascular:     Rate and Rhythm: Normal rate and regular rhythm.     Pulses: Normal pulses.     Heart sounds: Normal heart sounds. No murmur heard.   No friction rub. No gallop.  Comments: +coughing Pulmonary:     Effort: Pulmonary effort is normal. No accessory muscle usage or respiratory distress.     Breath sounds: Normal breath sounds.  Abdominal:     General: Bowel sounds are normal. There is no distension.     Palpations: Abdomen is soft.     Tenderness: There is no abdominal tenderness.  Genitourinary:    Comments: No cva tenderness. Musculoskeletal:        General: No swelling or tenderness.     Cervical back: Normal range of motion and neck supple. No rigidity.     Right lower leg: No edema.     Left lower leg: No edema.  Skin:    General: Skin is warm and dry.     Findings: No rash.  Neurological:     Mental Status: He is alert.     Comments: Alert, speech clear.   Psychiatric:         Mood and Affect: Mood normal.    ED Results / Procedures / Treatments   Labs (all labs ordered are listed, but only abnormal results are displayed) Results for orders placed or performed during the hospital encounter of 06/22/21  POC CBG, ED  Result Value Ref Range   Glucose-Capillary 115 (H) 70 - 99 mg/dL   DG Chest Portable 1 View  Result Date: 06/22/2021 CLINICAL DATA:  Nonproductive cough with shortness of breath. Recently tested positive for COVID. EXAM: PORTABLE CHEST 1 VIEW COMPARISON:  Radiographs 10/31/2019.  CT 10/31/2019. FINDINGS: 1523 hours. The heart size and mediastinal contours are stable with mild cardiomegaly and aortic atherosclerosis. The lungs are clear. There is no pleural effusion or pneumothorax. No acute osseous findings are evident. Telemetry leads overlie the chest. IMPRESSION: Stable chest.  No active cardiopulmonary process. Electronically Signed   By: Richardean Sale M.D.   On: 06/22/2021 15:30     EKG None  Radiology DG Chest Portable 1 View  Result Date: 06/22/2021 CLINICAL DATA:  Nonproductive cough with shortness of breath. Recently tested positive for COVID. EXAM: PORTABLE CHEST 1 VIEW COMPARISON:  Radiographs 10/31/2019.  CT 10/31/2019. FINDINGS: 1523 hours. The heart size and mediastinal contours are stable with mild cardiomegaly and aortic atherosclerosis. The lungs are clear. There is no pleural effusion or pneumothorax. No acute osseous findings are evident. Telemetry leads overlie the chest. IMPRESSION: Stable chest.  No active cardiopulmonary process. Electronically Signed   By: Richardean Sale M.D.   On: 06/22/2021 15:30    Procedures Procedures    Medications Ordered in ED Medications  guaiFENesin (ROBITUSSIN) 100 MG/5ML liquid 15 mL (has no administration in time range)    ED Course/ Medical Decision Making/ A&P Results for orders placed or performed during the hospital encounter of 06/22/21  POC CBG, ED  Result Value Ref Range    Glucose-Capillary 115 (H) 70 - 99 mg/dL   DG Chest Portable 1 View  Result Date: 06/22/2021 CLINICAL DATA:  Nonproductive cough with shortness of breath. Recently tested positive for COVID. EXAM: PORTABLE CHEST 1 VIEW COMPARISON:  Radiographs 10/31/2019.  CT 10/31/2019. FINDINGS: 1523 hours. The heart size and mediastinal contours are stable with mild cardiomegaly and aortic atherosclerosis. The lungs are clear. There is no pleural effusion or pneumothorax. No acute osseous findings are evident. Telemetry leads overlie the chest. IMPRESSION: Stable chest.  No active cardiopulmonary process. Electronically Signed   By: Richardean Sale M.D.   On: 06/22/2021 15:30  Medical Decision Making Problems Addressed: Acute cough: acute illness or injury COVID-19 virus infection: acute illness or injury with systemic symptoms Hx of glaucoma: chronic illness or injury Hypercholesterolemia: chronic illness or injury  Amount and/or Complexity of Data Reviewed External Data Reviewed: radiology and notes. Labs: ordered. Decision-making details documented in ED Course. Radiology: ordered and independent interpretation performed. Decision-making details documented in ED Course.  Risk OTC drugs. Prescription drug management.   Labs and imaging ordered. Pt reports covid +. Dispo decision including admission considered - will get xrays/lab, recheck pt and pulse ox and determine.   Reviewed nursing notes and prior charts for additional history.   External reports reviewed.  Pt has been vaccinated and boosted for covid.   Robitussin po.   CXR reviewed/interpreted by me - no pna.   Labs reviewed/interpreted by me - glucose ok.  Recheck pt, breathing comfortably, pulse ox 98% room air.   Given not hypoxic, no pna or edema on cxr - appear stable for d/c.   Cardiac montitor: sinus rhythm, rate 74.   Pt currently appears stable for d/c.  Return precautions provided.            Final Clinical Impression(s) / ED Diagnoses Final diagnoses:  None    Rx / DC Orders ED Discharge Orders     None         Lajean Saver, MD 06/22/21 1610

## 2021-06-27 DIAGNOSIS — J40 Bronchitis, not specified as acute or chronic: Secondary | ICD-10-CM | POA: Diagnosis not present

## 2021-06-27 DIAGNOSIS — Z6825 Body mass index (BMI) 25.0-25.9, adult: Secondary | ICD-10-CM | POA: Diagnosis not present

## 2021-06-27 DIAGNOSIS — Z20828 Contact with and (suspected) exposure to other viral communicable diseases: Secondary | ICD-10-CM | POA: Diagnosis not present

## 2021-07-04 DIAGNOSIS — R059 Cough, unspecified: Secondary | ICD-10-CM | POA: Diagnosis not present

## 2021-07-18 DIAGNOSIS — H401131 Primary open-angle glaucoma, bilateral, mild stage: Secondary | ICD-10-CM | POA: Diagnosis not present

## 2021-08-01 ENCOUNTER — Ambulatory Visit (INDEPENDENT_AMBULATORY_CARE_PROVIDER_SITE_OTHER): Payer: PPO | Admitting: Ophthalmology

## 2021-08-01 ENCOUNTER — Encounter (INDEPENDENT_AMBULATORY_CARE_PROVIDER_SITE_OTHER): Payer: Self-pay | Admitting: Ophthalmology

## 2021-08-01 ENCOUNTER — Encounter (INDEPENDENT_AMBULATORY_CARE_PROVIDER_SITE_OTHER): Payer: PPO | Admitting: Ophthalmology

## 2021-08-01 ENCOUNTER — Other Ambulatory Visit: Payer: Self-pay

## 2021-08-01 DIAGNOSIS — H35372 Puckering of macula, left eye: Secondary | ICD-10-CM | POA: Diagnosis not present

## 2021-08-01 DIAGNOSIS — H35371 Puckering of macula, right eye: Secondary | ICD-10-CM

## 2021-08-01 NOTE — Progress Notes (Signed)
? ? ?08/01/2021 ? ?  ? ?CHIEF COMPLAINT ?Patient presents for  ?Chief Complaint  ?Patient presents with  ? Retina Follow Up  ? ? ?For history of epiretinal membrane with severe ERM precellular material removal OS, August 2022, and minor ERM OD under observation ? ?HISTORY OF PRESENT ILLNESS: ?Roberto Rosales is a 76 y.o. male who presents to the clinic today for:  ? ?HPI   ? ? Retina Follow Up   ? ?      ? Diagnosis: Other (ERM)  ? Laterality: left eye  ? Onset: 6 months ago  ? Severity: mild  ? Course: stable  ? ?  ?  ? ? Comments   ?6 mos fu OU OCT. ?Patient states vision is stable and unchanged since last visit. Denies any new floaters or FOL. ?Pt is using latanoprost QHS OU, Dorz-TIM OS BID. ? ?  ?  ?Last edited by Laurin Coder on 08/01/2021  9:39 AM.  ?  ? ? ?Referring physician: ?[name], MD ?[address] ?[place],  [place] [address] ? ?HISTORICAL INFORMATION:  ? ?Selected notes from the [facility] ?  ? ?Lab Results  ?Component Value Date  ? HGBA1C 5.4 02/22/2017  ?  ? ?CURRENT MEDICATIONS: ?Current Outpatient Medications (Ophthalmic Drugs)  ?Medication Sig  ? dorzolamide-timolol (COSOPT) 22.3-6.8 MG/ML ophthalmic solution Place 1 drop into the left eye 2 (two) times daily.  ? latanoprost (XALATAN) 0.005 % ophthalmic solution Place 1 drop into both eyes at bedtime.   ? ?No current facility-administered medications for this visit. (Ophthalmic Drugs)  ? ?Current Outpatient Medications (Other)  ?Medication Sig  ? acetaminophen (TYLENOL) 500 MG tablet Take 500-1,000 mg by mouth every 6 (six) hours as needed (for pain.).  ? atorvastatin (LIPITOR) 10 MG tablet Take 5 mg by mouth at bedtime.   ? chlorpheniramine-HYDROcodone (TUSSIONEX PENNKINETIC ER) 10-8 MG/5ML Take 5 mLs by mouth every 12 (twelve) hours as needed for cough. No driving when taking  ? Multiple Vitamin (MULTIVITAMIN WITH MINERALS) TABS tablet Take 1 tablet by mouth daily.  ? naproxen sodium (ALEVE) 220 MG tablet Take 220-440 mg by  mouth 2 (two) times daily as needed (pain.).  ? ?No current facility-administered medications for this visit. (Other)  ? ? ? ? ?REVIEW OF SYSTEMS: ?ROS   ?Negative for: Constitutional, Gastrointestinal, Neurological, Skin, Genitourinary, Musculoskeletal, HENT, Endocrine, Cardiovascular, Eyes, Respiratory, Psychiatric, Allergic/Imm, Heme/Lymph ?Last edited by [name], MD on 08/01/2021 10:16 AM.  ?  ? ? ? ?ALLERGIES ?Allergies  ?Allergen Reactions  ? Codeine Nausea Only  ? ? ?PAST MEDICAL HISTORY ?Past Medical History:  ?Diagnosis Date  ? Epistaxis   ? Glaucoma   ? High cholesterol   ? Neuropathy   ? ?Past Surgical History:  ?Procedure Laterality Date  ? BACK SURGERY    ? CATARACT EXTRACTION Bilateral 2007  ? COLONOSCOPY  11/04/2007  ? Dr. [name], normal exam.  Recommended repeat colonoscopy in 10 years.  ? COLONOSCOPY WITH PROPOFOL N/A 02/23/2020  ? Procedure: COLONOSCOPY WITH PROPOFOL;  Surgeon: [name], DO;  [facility];  Service: Endoscopy;  Laterality: N/A;  7:30am  ? NASAL ENDOSCOPY WITH EPISTAXIS CONTROL Right 10/11/2017  ? Procedure: NASAL CAUTERY AND  BIOPSY OF RIGHT NASAL GRANULATION TISSUE;  Surgeon: [name], MD;  Location: [facility];  Service: ENT;  Laterality: Right;  ? NASAL ENDOSCOPY WITH EPISTAXIS CONTROL Right 10/22/2017  ? Procedure: NASAL ENDOSCOPY WITH RIGHT EPISTAXIS CONTROL;  Surgeon: [name], MD;  Location: [facility];  Service: ENT;  Laterality: Right;  ? POLYPECTOMY  02/23/2020  ? Procedure: POLYPECTOMY;  Surgeon: [name], DO;  Location: [facility];  Service: Endoscopy;;  ? TONSILLECTOMY  1950  ? ? ?FAMILY HISTORY ?Family History  ?Problem Relation Age of Onset  ? Cerebral aneurysm Mother   ? Heart disease Father   ? Stroke Father   ? Neuropathy Neg Hx   ? Colon cancer Neg Hx   ? ? ?SOCIAL HISTORY ?Social History  ? ?Tobacco Use  ? Smoking status: Never  ? Smokeless tobacco: Never  ?Vaping Use  ? Vaping Use: Never used  ?Substance Use  Topics  ? Alcohol use: No  ? Drug use: No  ? ?  ? ?  ? ?OPHTHALMIC EXAM: ? ?Base Eye Exam   ? ? Visual Acuity (ETDRS)   ? ?   Right Left  ? Dist cc 20/30 20/30 -2  ? ? Correction: Glasses  ? ?  ?  ? ? Tonometry (Tonopen, 9:42 AM)   ? ?   Right Left  ? Pressure 12 10  ? ?  ?  ? ? Pupils   ? ?   Pupils Dark Light APD  ? Right PERRL 6 4 None  ? Left PERRL 6 4 None  ? ?  ?  ? ? Extraocular Movement   ? ?   Right Left  ?  Full Full  ? ?  ?  ? ? Neuro/Psych   ? ? Oriented x3: Yes  ? Mood/Affect: Normal  ? ?  ?  ? ? Dilation   ? ? Both eyes: 1.0% Mydriacyl, 2.5% Phenylephrine @ 9:42 AM  ? ?  ?  ? ?  ? ?Slit Lamp and Fundus Exam   ? ? External Exam   ? ?   Right Left  ? External Normal Normal  ? ?  ?  ? ? Slit Lamp Exam   ? ?   Right Left  ? Lids/Lashes Normal Normal  ? Conjunctiva/Sclera White and quiet White and quiet  ? Cornea Clear Clear  ? Anterior Chamber Deep and quiet Deep and quiet  ? Iris Round and reactive Round and reactive  ? Lens Posterior chamber intraocular lens Posterior chamber intraocular lens  ? Anterior Vitreous Normal Normal  ? ?  ?  ? ? Fundus Exam   ? ?   Right Left  ? Posterior Vitreous Normal Clear, avitric  ? Disc Normal Normal, pink, perfused artery and vein  ? C/D Ratio 0.45 0.45  ? Macula Epiretinal membrane with minor topographic change ILM striae No topographic distortion with 20 diopter lens  ? Vessels Normal Normal  ? Periphery Normal Normal  ? ?  ?  ? ?  ? ? ?IMAGING AND PROCEDURES  ?Imaging and Procedures for 08/01/21 ? ?OCT, Retina - OU - Both Eyes   ? ?   ?Right Eye ?Quality was good. Scan locations included subfoveal. Central Foveal Thickness: 369. Findings include epiretinal membrane, abnormal foveal contour.  ? ?Left Eye ?Quality was good. Scan locations included subfoveal. Central Foveal Thickness: 404. Progression has no prior data. Findings include abnormal foveal contour, epiretinal membrane.  ? ?Notes ?OD with minor epiretinal membrane and and macular distortion of the inner  fovea yet with no acuity change ? ?OS much less thickening OS post removal of precellular membrane, not the ILM, with improved acuity, 503 ?m prior to surgery and now 360, NL slightly worse to  404 ? ?  ? ? ?  ?  ? ?  ?ASSESSMENT/PLAN: ? ?Left epiretinal membrane ?ERM removed only left eye due to normalization of the macular findings no evidence of ILM change at that time ? ?However the ILM now is undergoing a contracture process with slight increase in thickening will continue to observe as acuity is unaffected ? ?Right epiretinal membrane ?Minor ILM changes, no impact on acuity right eye, observe  ? ?  ICD-10-CM   ?1. Left epiretinal membrane  H35.372 OCT, Retina - OU - Both Eyes  ?  ?2. Right epiretinal membrane  H35.371   ?  ? ? ?1.  OS, major ERM removal with improved acuity from 20/70, to now 20/30, with secondary ILM contraction now developing left eye.  But no impact on acuity will continue to monitor and observe ? ?2.  Minor ILM changes OD and change over time no impact on acuity observe ? ?3. ? ?Ophthalmic Meds Ordered this visit:  ?No orders of the defined types were placed in this encounter. ? ? ?  ? ?Return in about 6 months (around 02/01/2022) for DILATE OU, OCT. ? ?There are no Patient Instructions on file for this visit. ? ? ?Explained the diagnoses, plan, and follow up with the patient and they expressed understanding.  Patient expressed understanding of the importance of proper follow up care.  ? ?Clent Demark. Jaliel Deavers M.D. ?Diseases & Surgery of the Retina and Vitreous ?[place] ?08/01/21 ? ? ? ? ?Abbreviations: ?M myopia (nearsighted); A astigmatism; H hyperopia (farsighted); P presbyopia; Mrx spectacle prescription;  CTL contact lenses; OD right eye; OS left eye; OU both eyes  XT exotropia; ET esotropia; PEK punctate epithelial keratitis; PEE punctate epithelial erosions; DES dry eye syndrome; MGD meibomian gland dysfunction; ATs artificial tears; PFAT's preservative free artificial  tears; [place] nuclear sclerotic cataract; PSC posterior subcapsular cataract; ERM epi-retinal membrane; PVD posterior vitreous detachment; RD retinal detachment; DM diabetes mellitus; DR diabetic retinopathy; NPDR

## 2021-08-01 NOTE — Assessment & Plan Note (Signed)
Minor ILM changes, no impact on acuity right eye, observe ?

## 2021-08-01 NOTE — Assessment & Plan Note (Signed)
ERM removed only left eye due to normalization of the macular findings no evidence of ILM change at that time ? ?However the ILM now is undergoing a contracture process with slight increase in thickening will continue to observe as acuity is unaffected ?

## 2021-11-16 DIAGNOSIS — H401131 Primary open-angle glaucoma, bilateral, mild stage: Secondary | ICD-10-CM | POA: Diagnosis not present

## 2021-11-29 DIAGNOSIS — E7849 Other hyperlipidemia: Secondary | ICD-10-CM | POA: Diagnosis not present

## 2021-11-29 DIAGNOSIS — R7301 Impaired fasting glucose: Secondary | ICD-10-CM | POA: Diagnosis not present

## 2021-11-29 DIAGNOSIS — R7989 Other specified abnormal findings of blood chemistry: Secondary | ICD-10-CM | POA: Diagnosis not present

## 2021-12-02 ENCOUNTER — Other Ambulatory Visit (HOSPITAL_COMMUNITY): Payer: Self-pay | Admitting: Family Medicine

## 2021-12-02 ENCOUNTER — Other Ambulatory Visit: Payer: Self-pay | Admitting: Family Medicine

## 2021-12-02 DIAGNOSIS — H409 Unspecified glaucoma: Secondary | ICD-10-CM | POA: Diagnosis not present

## 2021-12-02 DIAGNOSIS — R7301 Impaired fasting glucose: Secondary | ICD-10-CM | POA: Diagnosis not present

## 2021-12-02 DIAGNOSIS — G629 Polyneuropathy, unspecified: Secondary | ICD-10-CM | POA: Diagnosis not present

## 2021-12-02 DIAGNOSIS — E7849 Other hyperlipidemia: Secondary | ICD-10-CM | POA: Diagnosis not present

## 2021-12-02 DIAGNOSIS — Z6826 Body mass index (BMI) 26.0-26.9, adult: Secondary | ICD-10-CM | POA: Diagnosis not present

## 2021-12-02 DIAGNOSIS — K862 Cyst of pancreas: Secondary | ICD-10-CM | POA: Diagnosis not present

## 2021-12-02 DIAGNOSIS — I10 Essential (primary) hypertension: Secondary | ICD-10-CM | POA: Diagnosis not present

## 2021-12-05 DIAGNOSIS — L821 Other seborrheic keratosis: Secondary | ICD-10-CM | POA: Diagnosis not present

## 2021-12-05 DIAGNOSIS — L720 Epidermal cyst: Secondary | ICD-10-CM | POA: Diagnosis not present

## 2021-12-05 DIAGNOSIS — B078 Other viral warts: Secondary | ICD-10-CM | POA: Diagnosis not present

## 2021-12-05 DIAGNOSIS — L728 Other follicular cysts of the skin and subcutaneous tissue: Secondary | ICD-10-CM | POA: Diagnosis not present

## 2021-12-16 ENCOUNTER — Other Ambulatory Visit (HOSPITAL_COMMUNITY): Payer: Self-pay | Admitting: Family Medicine

## 2021-12-16 ENCOUNTER — Ambulatory Visit (HOSPITAL_COMMUNITY)
Admission: RE | Admit: 2021-12-16 | Discharge: 2021-12-16 | Disposition: A | Discharge status: Home / Self Care | Payer: PPO | Source: Ambulatory Visit | Attending: Family Medicine | Admitting: Family Medicine

## 2021-12-16 DIAGNOSIS — K862 Cyst of pancreas: Secondary | ICD-10-CM

## 2021-12-16 MED ORDER — GADOBUTROL 1 MMOL/ML IV SOLN
7.0000 mL | Freq: Once | INTRAVENOUS | Status: AC | PRN
  Administered 2021-12-16: 7 mL via INTRAVENOUS

## 2022-01-03 ENCOUNTER — Other Ambulatory Visit (HOSPITAL_COMMUNITY): Payer: PPO

## 2022-02-01 ENCOUNTER — Ambulatory Visit (INDEPENDENT_AMBULATORY_CARE_PROVIDER_SITE_OTHER): Payer: PPO | Admitting: Ophthalmology

## 2022-02-01 ENCOUNTER — Encounter (INDEPENDENT_AMBULATORY_CARE_PROVIDER_SITE_OTHER): Payer: Self-pay | Admitting: Ophthalmology

## 2022-02-01 DIAGNOSIS — H35371 Puckering of macula, right eye: Secondary | ICD-10-CM | POA: Diagnosis not present

## 2022-02-01 DIAGNOSIS — H35372 Puckering of macula, left eye: Secondary | ICD-10-CM

## 2022-02-01 NOTE — Assessment & Plan Note (Addendum)
OS looks great post vitrectomy and ERM removal.  With much less intraretinal fluid and CME

## 2022-02-01 NOTE — Assessment & Plan Note (Signed)
On OCT ERM on the right eye No progression with good acuity

## 2022-02-01 NOTE — Progress Notes (Signed)
02/01/2022     CHIEF COMPLAINT Patient presents for  Chief Complaint  Patient presents with   Retina Evaluation      HISTORY OF PRESENT ILLNESS: Roberto Rosales is a 76 y.o. male who presents to the clinic today for:   HPI     Retina Evaluation           Laterality: right eye         Comments   Left epiretinal membrane  6 mths dilate ou oct, and hx of ERM OD Pt states his vision has been stable Pt denies any new floaters or FOL      Last edited by [name], MD on 02/01/2022 10:08 AM.      Referring physician: Manon Hilding, MD [address],  [place] [address]  HISTORICAL INFORMATION:   Selected notes from the MEDICAL RECORD NUMBER    Lab Results  Component Value Date   HGBA1C 5.4 02/22/2017     CURRENT MEDICATIONS: Current Outpatient Medications (Ophthalmic Drugs)  Medication Sig   dorzolamide-timolol (COSOPT) 22.3-6.8 MG/ML ophthalmic solution Place 1 drop into the left eye 2 (two) times daily.   latanoprost (XALATAN) 0.005 % ophthalmic solution Place 1 drop into both eyes at bedtime.    No current facility-administered medications for this visit. (Ophthalmic Drugs)   Current Outpatient Medications (Other)  Medication Sig   acetaminophen (TYLENOL) 500 MG tablet Take 500-1,000 mg by mouth every 6 (six) hours as needed (for pain.).   atorvastatin (LIPITOR) 10 MG tablet Take 5 mg by mouth at bedtime.    chlorpheniramine-HYDROcodone (TUSSIONEX PENNKINETIC ER) 10-8 MG/5ML Take 5 mLs by mouth every 12 (twelve) hours as needed for cough. No driving when taking   Multiple Vitamin (MULTIVITAMIN WITH MINERALS) TABS tablet Take 1 tablet by mouth daily.   naproxen sodium (ALEVE) 220 MG tablet Take 220-440 mg by mouth 2 (two) times daily as needed (pain.).   No current facility-administered medications for this visit. (Other)      REVIEW OF SYSTEMS: ROS   Negative for: Constitutional, Gastrointestinal, Neurological, Skin, Genitourinary,  Musculoskeletal, HENT, Endocrine, Cardiovascular, Eyes, Respiratory, Psychiatric, Allergic/Imm, Heme/Lymph Last edited by Orene Desanctis D, CMA on 02/01/2022  9:39 AM.       ALLERGIES Allergies  Allergen Reactions   Codeine Nausea Only    PAST MEDICAL HISTORY Past Medical History:  Diagnosis Date   Epistaxis    Glaucoma    High cholesterol    Neuropathy    Past Surgical History:  Procedure Laterality Date   BACK SURGERY     CATARACT EXTRACTION Bilateral 2007   COLONOSCOPY  11/04/2007   Dr. [name], normal exam.  Recommended repeat colonoscopy in 10 years.   COLONOSCOPY WITH PROPOFOL N/A 02/23/2020   Procedure: COLONOSCOPY WITH PROPOFOL;  Surgeon: [name], DO;  [facility];  Service: Endoscopy;  Laterality: N/A;  7:30am   NASAL ENDOSCOPY WITH EPISTAXIS CONTROL Right 10/11/2017   Procedure: NASAL CAUTERY AND  BIOPSY OF RIGHT NASAL GRANULATION TISSUE;  Surgeon: [name], MD;  Location: [facility];  Service: ENT;  Laterality: Right;   NASAL ENDOSCOPY WITH EPISTAXIS CONTROL Right 10/22/2017   Procedure: NASAL ENDOSCOPY WITH RIGHT EPISTAXIS CONTROL;  Surgeon: [name], MD;  Location: [facility];  Service: ENT;  Laterality: Right;   POLYPECTOMY  02/23/2020   Procedure: POLYPECTOMY;  Surgeon: [name], DO;  [facility];  Service: Endoscopy;;   TONSILLECTOMY  1950  FAMILY HISTORY Family History  Problem Relation Age of Onset   Cerebral aneurysm Mother    Heart disease Father    Stroke Father    Neuropathy Neg Hx    Colon cancer Neg Hx     SOCIAL HISTORY Social History   Tobacco Use   Smoking status: Never   Smokeless tobacco: Never  Vaping Use   Vaping Use: Never used  Substance Use Topics   Alcohol use: No   Drug use: No         OPHTHALMIC EXAM:  Base Eye Exam     Visual Acuity (ETDRS)       Right Left   Dist cc 20/20 20/30 -1    Correction: Glasses         Tonometry (Tonopen, 9:42 AM)        Right Left   Pressure 8 8         Pupils       Pupils   Right PERRL   Left PERRL         Visual Fields       Left Right    Full Full         Extraocular Movement       Right Left    Ortho Ortho    -- -- --  --  --  -- -- --   -- -- --  --  --  -- -- --           Neuro/Psych     Oriented x3: Yes   Mood/Affect: Normal         Dilation     Both eyes: 1.0% Mydriacyl, 2.5% Phenylephrine @ 9:40 AM           Slit Lamp and Fundus Exam     External Exam       Right Left   External Normal Normal         Slit Lamp Exam       Right Left   Lids/Lashes Normal Normal   Conjunctiva/Sclera White and quiet White and quiet   Cornea Clear Clear   Anterior Chamber Deep and quiet Deep and quiet   Iris Round and reactive Round and reactive   Lens Posterior chamber intraocular lens Posterior chamber intraocular lens   Anterior Vitreous Normal Normal         Fundus Exam       Right Left   Posterior Vitreous Normal Clear, avitric   Disc Normal Normal, pink, perfused artery and vein   C/D Ratio 0.45 0.45   Macula Epiretinal membrane with minor topographic change ILM striae No topographic distortion with 20 diopter lens   Vessels Normal Normal   Periphery Normal Normal            IMAGING AND PROCEDURES  Imaging and Procedures for 02/01/22  OCT, Retina - OU - Both Eyes       Right Eye Quality was good. Scan locations included subfoveal. Central Foveal Thickness: 368. Progression has been stable. Findings include abnormal foveal contour, epiretinal membrane.   Left Eye Quality was good. Scan locations included subfoveal. Central Foveal Thickness: 405. Progression has improved. Findings include abnormal foveal contour, epiretinal membrane.   Notes OD with minor epiretinal membrane and and macular distortion of the inner fovea yet with no acuity change  OS much less thickening OS post removal of precellular membrane, not the ILM, with  improved acuity, 503 m prior to surgery and now 405, no  impact on acuity observed              ASSESSMENT/PLAN:  Left epiretinal membrane OS looks great post vitrectomy and ERM removal.  With much less intraretinal fluid and CME  Right epiretinal membrane On OCT ERM on the right eye No progression with good acuity     ICD-10-CM   1. Left epiretinal membrane  H35.372 OCT, Retina - OU - Both Eyes    2. Right epiretinal membrane  H35.371       1.  OU vastly improved and stable.  OD with residual epiretinal membrane, no intervention required to this date.  We will continue to monitor OD.  2.  OS status post removal of severe epiretinal membrane in August 2022.  With no ILM striae at that time.  Doing very well with improved macular condition and acuity.  We will continue to monitor and observe  3.  Ophthalmic Meds Ordered this visit:  No orders of the defined types were placed in this encounter.      Return in about 9 months (around 11/02/2022) for DILATE OU, OCT.  There are no Patient Instructions on file for this visit.   Explained the diagnoses, plan, and follow up with the patient and they expressed understanding.  Patient expressed understanding of the importance of proper follow up care.   Clent Demark Orlando Thalmann M.D. Diseases & Surgery of the Retina and Vitreous [facility] [place] 02/01/22     Abbreviations: M myopia (nearsighted); A astigmatism; H hyperopia (farsighted); P presbyopia; Mrx spectacle prescription;  CTL contact lenses; OD right eye; OS left eye; OU both eyes  XT exotropia; ET esotropia; PEK punctate epithelial keratitis; PEE punctate epithelial erosions; DES dry eye syndrome; MGD meibomian gland dysfunction; ATs artificial tears; PFAT's preservative free artificial tears; [place] nuclear sclerotic cataract; PSC posterior subcapsular cataract; ERM epi-retinal membrane; PVD posterior vitreous detachment; RD retinal detachment; DM diabetes mellitus;  DR diabetic retinopathy; NPDR non-proliferative diabetic retinopathy; PDR proliferative diabetic retinopathy; CSME clinically significant macular edema; DME diabetic macular edema; dbh dot blot hemorrhages; CWS cotton wool spot; POAG primary open angle glaucoma; C/D cup-to-disc ratio; HVF humphrey visual field; GVF goldmann visual field; OCT optical coherence tomography; IOP intraocular pressure; BRVO Branch retinal vein occlusion; CRVO central retinal vein occlusion; CRAO central retinal artery occlusion; BRAO branch retinal artery occlusion; RT retinal tear; SB scleral buckle; PPV pars plana vitrectomy; VH Vitreous hemorrhage; PRP panretinal laser photocoagulation; IVK intravitreal kenalog; VMT vitreomacular traction; MH Macular hole;  NVD neovascularization of the disc; NVE neovascularization elsewhere; AREDS age related eye disease study; ARMD age related macular degeneration; POAG primary open angle glaucoma; EBMD epithelial/anterior basement membrane dystrophy; ACIOL anterior chamber intraocular lens; IOL intraocular lens; PCIOL posterior chamber intraocular lens; Phaco/IOL phacoemulsification with intraocular lens placement; [place] photorefractive keratectomy; LASIK laser assisted in situ keratomileusis; HTN hypertension; DM diabetes mellitus; COPD chronic obstructive pulmonary disease

## 2022-02-02 DIAGNOSIS — Z23 Encounter for immunization: Secondary | ICD-10-CM | POA: Diagnosis not present

## 2022-02-02 DIAGNOSIS — Z6832 Body mass index (BMI) 32.0-32.9, adult: Secondary | ICD-10-CM | POA: Diagnosis not present

## 2022-02-25 DIAGNOSIS — Z6826 Body mass index (BMI) 26.0-26.9, adult: Secondary | ICD-10-CM | POA: Diagnosis not present

## 2022-02-25 DIAGNOSIS — L03011 Cellulitis of right finger: Secondary | ICD-10-CM | POA: Diagnosis not present

## 2022-02-25 DIAGNOSIS — R030 Elevated blood-pressure reading, without diagnosis of hypertension: Secondary | ICD-10-CM | POA: Diagnosis not present

## 2022-03-04 ENCOUNTER — Ambulatory Visit
Admission: EM | Admit: 2022-03-04 | Discharge: 2022-03-04 | Disposition: A | Discharge status: Home / Self Care | Payer: PPO | Attending: Nurse Practitioner | Admitting: Nurse Practitioner

## 2022-03-04 ENCOUNTER — Encounter: Payer: Self-pay | Admitting: Emergency Medicine

## 2022-03-04 DIAGNOSIS — L03011 Cellulitis of right finger: Secondary | ICD-10-CM | POA: Diagnosis not present

## 2022-03-04 MED ORDER — DOXYCYCLINE HYCLATE 100 MG PO TABS: 100.0000 mg | ORAL_TABLET | Freq: Two times a day (BID) | ORAL | 0 refills | Status: DC

## 2022-03-04 NOTE — ED Triage Notes (Signed)
States he has a infected thumb on right hand.  States started 3 weeks ago.  States a metal sprinkler fell on thumb.  Was seen by PCP  a week ago and given Keflex and states thumb has not improved.

## 2022-03-04 NOTE — ED Provider Notes (Signed)
[facility]    CSN: [id number] Arrival date & time: 03/04/22  0623      History   Chief Complaint No chief complaint on file.   HPI Roberto Rosales is a 76 y.o. male.   The history is provided by the patient.   Presents for complaints of pain and swelling to the right thumb.  Patient states symptoms started approximately 3 weeks ago when a metal spring girl fell on the thumb.  He states that he did see his PCP a week ago and was prescribed Keflex.  States that he finished the antibiotic today.  He states that he still has tenderness and pain to the right thumb.  He states the pain presents when he touches it.  He denies fever, chills, chest pain, abdominal pain, nausea, vomiting, or diarrhea.  States he has been soaking the right thumb in warm salt water along with adding vinegar to the solution last evening.  Past Medical History:  Diagnosis Date   Epistaxis    Glaucoma    High cholesterol    Neuropathy     Patient Active Problem List   Diagnosis Date Noted   Moderate stage steroid-induced glaucoma 12/21/2020   Left epiretinal membrane 11/18/2020   Right epiretinal membrane 11/18/2020   Positive occult stool blood test 01/28/2020   Epigastric pain 11/01/2019   Lesion of pancreas 11/01/2019   Mixed hyperlipidemia 08/02/2017   Glaucoma 08/02/2017    Past Surgical History:  Procedure Laterality Date   BACK SURGERY     CATARACT EXTRACTION Bilateral 2007   COLONOSCOPY  11/04/2007   Dr. [name], normal exam.  Recommended repeat colonoscopy in 10 years.   COLONOSCOPY WITH PROPOFOL N/A 02/23/2020   Procedure: COLONOSCOPY WITH PROPOFOL;  Surgeon: [name], DO;  [facility];  Service: Endoscopy;  Laterality: N/A;  7:30am   NASAL ENDOSCOPY WITH EPISTAXIS CONTROL Right 10/11/2017   Procedure: NASAL CAUTERY AND  BIOPSY OF RIGHT NASAL GRANULATION TISSUE;  Surgeon: [name], MD;  Location: [facility];  Service: ENT;  Laterality: Right;   NASAL  ENDOSCOPY WITH EPISTAXIS CONTROL Right 10/22/2017   Procedure: NASAL ENDOSCOPY WITH RIGHT EPISTAXIS CONTROL;  Surgeon: [name], MD;  Location: [facility];  Service: ENT;  Laterality: Right;   POLYPECTOMY  02/23/2020   Procedure: POLYPECTOMY;  Surgeon: [name], DO;  [facility];  Service: Endoscopy;;   TONSILLECTOMY  1950       Home Medications    Prior to Admission medications   Medication Sig Start Date End Date Taking? Authorizing Provider  doxycycline (VIBRA-TABS) 100 MG tablet Take 1 tablet (100 mg total) by mouth 2 (two) times daily. 03/04/22  Yes Payden Bonus-Warren, Alda Lea, NP  acetaminophen (TYLENOL) 500 MG tablet Take 500-1,000 mg by mouth every 6 (six) hours as needed (for pain.).    [provider]  atorvastatin (LIPITOR) 10 MG tablet Take 5 mg by mouth at bedtime.  08/16/16   [provider]  chlorpheniramine-HYDROcodone (TUSSIONEX PENNKINETIC ER) 10-8 MG/5ML Take 5 mLs by mouth every 12 (twelve) hours as needed for cough. No driving when taking 7/62/83   Lajean Saver, MD  dorzolamide-timolol (COSOPT) 22.3-6.8 MG/ML ophthalmic solution Place 1 drop into the left eye 2 (two) times daily.    [provider]  latanoprost (XALATAN) 0.005 % ophthalmic solution Place 1 drop into both eyes at bedtime.  07/18/16   [provider]  Multiple Vitamin (MULTIVITAMIN WITH MINERALS) TABS tablet  Take 1 tablet by mouth daily.    [provider]  naproxen sodium (ALEVE) 220 MG tablet Take 220-440 mg by mouth 2 (two) times daily as needed (pain.).    [provider]    Family History Family History  Problem Relation Age of Onset   Cerebral aneurysm Mother    Heart disease Father    Stroke Father    Neuropathy Neg Hx    Colon cancer Neg Hx     Social History Social History   Tobacco Use   Smoking status: Never   Smokeless tobacco: Never  Vaping Use   Vaping Use: Never used  Substance Use Topics    Alcohol use: No   Drug use: No     Allergies   Codeine   Review of Systems Review of Systems Per HPI  Physical Exam Triage Vital Signs ED Triage Vitals  Enc Vitals Group     BP 03/04/22 0830 (!) 180/82     Pulse Rate 03/04/22 0830 65     Resp 03/04/22 0830 18     Temp 03/04/22 0830 98.7 F (37.1 C)     Temp Source 03/04/22 0830 Oral     SpO2 03/04/22 0830 98 %     Weight --      Height --      Head Circumference --      Peak Flow --      Pain Score 03/04/22 0832 5     Pain Loc --      Pain Edu? --      Excl. in [place]? --    No data found.  Updated Vital Signs BP (!) 180/82 (BP Location: Right Arm)   Pulse 65   Temp 98.7 F (37.1 C) (Oral)   Resp 18   SpO2 98%   Visual Acuity Right Eye Distance:   Left Eye Distance:   Bilateral Distance:    Right Eye Near:   Left Eye Near:    Bilateral Near:     Physical Exam Vitals and nursing note reviewed.  Constitutional:      General: He is not in acute distress.    Appearance: Normal appearance.  Pulmonary:     Effort: Pulmonary effort is normal.  Skin:    General: Skin is warm and dry.     Comments: Swelling noted to the medial base of the right thumb.  Area is tender to palpation.  There is no fluctuance, oozing, or drainage present.  Paronychia is present.  Neurological:     General: No focal deficit present.     Mental Status: He is alert and oriented to person, place, and time.  Psychiatric:        Mood and Affect: Mood normal.        Behavior: Behavior normal.      UC Treatments / Results  Labs (all labs ordered are listed, but only abnormal results are displayed) Labs Reviewed - No data to display  EKG   Radiology No results found.  Procedures Procedures (including critical care time)  Medications Ordered in UC Medications - No data to display  Initial Impression / Assessment and Plan / UC Course  I have reviewed the triage vital signs and the nursing notes.  Pertinent labs & imaging  results that were available during my care of the patient were reviewed by me and considered in my medical decision making (see chart for details).  Paronychia present to the right thumb.  Patient is well-appearing,  he is in no acute distress.  Will treat with doxycycline 100 mg twice daily for the next 5 days.  Patient advised to continue soaking the right thumb and warm Epsom salt soaks at least 3-4 times daily and using warm compresses.  Patient was advised to follow-up with his primary care physician if symptoms do not improve.  Patient verbalizes understanding.  All questions were answered.  Patient is stable for discharge. Final Clinical Impressions(s) / UC Diagnoses   Final diagnoses:  Paronychia of right thumb  Cellulitis of right thumb     Discharge Instructions      Take medication as prescribed. Continue soaking the right thumb in Epsom salt soaks at least 3-4 times daily.  You may also wrap the right thumb and warm compresses as needed for pain. May take over-the-counter Tylenol as needed for pain or discomfort. If symptoms do not improve after completing this medication regimen, please follow-up with your primary care physician.     ED Prescriptions     Medication Sig Dispense Auth. Provider   doxycycline (VIBRA-TABS) 100 MG tablet Take 1 tablet (100 mg total) by mouth 2 (two) times daily. 10 tablet Aldina Porta-Warren, Alda Lea, NP      PDMP not reviewed this encounter.   Tish Men, NP 03/04/22 [phone]

## 2022-03-04 NOTE — Discharge Instructions (Addendum)
Take medication as prescribed. Continue soaking the right thumb in Epsom salt soaks at least 3-4 times daily.  You may also wrap the right thumb and warm compresses as needed for pain. May take over-the-counter Tylenol as needed for pain or discomfort. If symptoms do not improve after completing this medication regimen, please follow-up with your primary care physician.

## 2022-03-27 DIAGNOSIS — H524 Presbyopia: Secondary | ICD-10-CM | POA: Diagnosis not present

## 2022-03-27 DIAGNOSIS — H401131 Primary open-angle glaucoma, bilateral, mild stage: Secondary | ICD-10-CM | POA: Diagnosis not present

## 2022-03-27 DIAGNOSIS — Z961 Presence of intraocular lens: Secondary | ICD-10-CM | POA: Diagnosis not present

## 2022-03-27 DIAGNOSIS — H52203 Unspecified astigmatism, bilateral: Secondary | ICD-10-CM | POA: Diagnosis not present

## 2022-03-27 DIAGNOSIS — H5202 Hypermetropia, left eye: Secondary | ICD-10-CM | POA: Diagnosis not present

## 2022-05-09 DIAGNOSIS — R030 Elevated blood-pressure reading, without diagnosis of hypertension: Secondary | ICD-10-CM | POA: Diagnosis not present

## 2022-05-09 DIAGNOSIS — Z6826 Body mass index (BMI) 26.0-26.9, adult: Secondary | ICD-10-CM | POA: Diagnosis not present

## 2022-05-09 DIAGNOSIS — L0390 Cellulitis, unspecified: Secondary | ICD-10-CM | POA: Diagnosis not present

## 2022-05-31 DIAGNOSIS — E7849 Other hyperlipidemia: Secondary | ICD-10-CM | POA: Diagnosis not present

## 2022-05-31 DIAGNOSIS — I10 Essential (primary) hypertension: Secondary | ICD-10-CM | POA: Diagnosis not present

## 2022-05-31 DIAGNOSIS — E7801 Familial hypercholesterolemia: Secondary | ICD-10-CM | POA: Diagnosis not present

## 2022-05-31 DIAGNOSIS — R7989 Other specified abnormal findings of blood chemistry: Secondary | ICD-10-CM | POA: Diagnosis not present

## 2022-05-31 DIAGNOSIS — R7301 Impaired fasting glucose: Secondary | ICD-10-CM | POA: Diagnosis not present

## 2022-06-05 DIAGNOSIS — Z6826 Body mass index (BMI) 26.0-26.9, adult: Secondary | ICD-10-CM | POA: Diagnosis not present

## 2022-06-05 DIAGNOSIS — K862 Cyst of pancreas: Secondary | ICD-10-CM | POA: Diagnosis not present

## 2022-06-05 DIAGNOSIS — R7301 Impaired fasting glucose: Secondary | ICD-10-CM | POA: Diagnosis not present

## 2022-06-05 DIAGNOSIS — G629 Polyneuropathy, unspecified: Secondary | ICD-10-CM | POA: Diagnosis not present

## 2022-06-05 DIAGNOSIS — H409 Unspecified glaucoma: Secondary | ICD-10-CM | POA: Diagnosis not present

## 2022-06-05 DIAGNOSIS — I10 Essential (primary) hypertension: Secondary | ICD-10-CM | POA: Diagnosis not present

## 2022-06-05 DIAGNOSIS — Z23 Encounter for immunization: Secondary | ICD-10-CM | POA: Diagnosis not present

## 2022-06-05 DIAGNOSIS — E7849 Other hyperlipidemia: Secondary | ICD-10-CM | POA: Diagnosis not present

## 2022-06-12 DIAGNOSIS — Z6826 Body mass index (BMI) 26.0-26.9, adult: Secondary | ICD-10-CM | POA: Diagnosis not present

## 2022-06-12 DIAGNOSIS — Z0001 Encounter for general adult medical examination with abnormal findings: Secondary | ICD-10-CM | POA: Diagnosis not present

## 2022-06-12 DIAGNOSIS — E7849 Other hyperlipidemia: Secondary | ICD-10-CM | POA: Diagnosis not present

## 2022-06-12 DIAGNOSIS — D649 Anemia, unspecified: Secondary | ICD-10-CM | POA: Diagnosis not present

## 2022-06-12 DIAGNOSIS — I10 Essential (primary) hypertension: Secondary | ICD-10-CM | POA: Diagnosis not present

## 2022-07-06 ENCOUNTER — Encounter: Payer: Self-pay | Admitting: Radiology

## 2022-07-14 DIAGNOSIS — Z6826 Body mass index (BMI) 26.0-26.9, adult: Secondary | ICD-10-CM | POA: Diagnosis not present

## 2022-07-14 DIAGNOSIS — R030 Elevated blood-pressure reading, without diagnosis of hypertension: Secondary | ICD-10-CM | POA: Diagnosis not present

## 2022-07-14 DIAGNOSIS — R42 Dizziness and giddiness: Secondary | ICD-10-CM | POA: Diagnosis not present

## 2022-07-26 DIAGNOSIS — H401131 Primary open-angle glaucoma, bilateral, mild stage: Secondary | ICD-10-CM | POA: Diagnosis not present

## 2022-08-17 DIAGNOSIS — Z79899 Other long term (current) drug therapy: Secondary | ICD-10-CM | POA: Diagnosis not present

## 2022-08-17 DIAGNOSIS — R030 Elevated blood-pressure reading, without diagnosis of hypertension: Secondary | ICD-10-CM | POA: Diagnosis not present

## 2022-08-17 DIAGNOSIS — E782 Mixed hyperlipidemia: Secondary | ICD-10-CM | POA: Diagnosis not present

## 2022-08-17 DIAGNOSIS — R7301 Impaired fasting glucose: Secondary | ICD-10-CM | POA: Diagnosis not present

## 2022-08-17 DIAGNOSIS — R2689 Other abnormalities of gait and mobility: Secondary | ICD-10-CM | POA: Diagnosis not present

## 2022-08-17 DIAGNOSIS — F5221 Male erectile disorder: Secondary | ICD-10-CM | POA: Diagnosis not present

## 2022-08-17 DIAGNOSIS — Z7689 Persons encountering health services in other specified circumstances: Secondary | ICD-10-CM | POA: Diagnosis not present

## 2022-08-17 DIAGNOSIS — N529 Male erectile dysfunction, unspecified: Secondary | ICD-10-CM | POA: Diagnosis not present

## 2022-09-04 DIAGNOSIS — R238 Other skin changes: Secondary | ICD-10-CM | POA: Diagnosis not present

## 2022-09-04 DIAGNOSIS — X58XXXD Exposure to other specified factors, subsequent encounter: Secondary | ICD-10-CM | POA: Diagnosis not present

## 2022-09-04 DIAGNOSIS — S80821D Blister (nonthermal), right lower leg, subsequent encounter: Secondary | ICD-10-CM | POA: Diagnosis not present

## 2022-09-08 ENCOUNTER — Ambulatory Visit
Admission: EM | Admit: 2022-09-08 | Discharge: 2022-09-08 | Disposition: A | Discharge status: Home / Self Care | Payer: PPO

## 2022-09-08 DIAGNOSIS — R030 Elevated blood-pressure reading, without diagnosis of hypertension: Secondary | ICD-10-CM

## 2022-09-08 DIAGNOSIS — L03115 Cellulitis of right lower limb: Secondary | ICD-10-CM

## 2022-09-08 MED ORDER — CEPHALEXIN 500 MG PO CAPS: 500.0000 mg | ORAL_CAPSULE | Freq: Three times a day (TID) | ORAL | 0 refills | Status: DC

## 2022-09-08 NOTE — ED Triage Notes (Signed)
Pt reports sore in right leg  after he "bumped with something" x 2 weeks. Pt is using mupirocin ointment with relief.  Reports yellow discharge is coming out of the sore.

## 2022-09-08 NOTE — ED Provider Notes (Signed)
[facility]    CSN: [id number] Arrival date & time: 09/08/22  4098      History   Chief Complaint Chief Complaint  Patient presents with   Leg Pain    HPI Roberto Rosales is a 77 y.o. male.   Patient presents today with a 2-week history of lesion to his right anterior leg that has worsened in the past several days.  Reports that approximately 2 weeks ago he bumped into something and this left a small wound on his right leg.  He then developed a large bulla that lasted for several weeks.  He was seen by his primary care who recommended that he avoid manipulation of this and start Bactroban ointment with dressing changes.  He reports that this drained on its own and it was initially clear fluid.  He has been keeping it clean with soap and water and applying the Bactroban ointment as prescribed but has noticed that has become more painful, more red, with yellow drainage.  He denies any fever, nausea, vomiting.  Reports pain is rated 8 on a 0-10 pain scale, described as a sharp, localized to wound, no identifiable trigger, no aggravating relieving factors identified.  He denies any recent antibiotics in the past 6 months.  Denies history of MRSA.  Denies any recent hospitalization or medical procedure.  Blood pressure is elevated today.  Patient reports that he has a history of whitecoat syndrome.  He monitors his blood pressure closely at home and reports that his is generally 120/80.  Denies any chest pain, shortness of breath, headache, vision change, dizziness.  He does not take NSAIDs, decongestants and denies any increased caffeine or sodium consumption.  He is followed by his primary care related to chronic disease.    Past Medical History:  Diagnosis Date   Epistaxis    Glaucoma    High cholesterol    Neuropathy     Patient Active Problem List   Diagnosis Date Noted   Moderate stage steroid-induced glaucoma 12/21/2020   Left epiretinal membrane 11/18/2020   Right  epiretinal membrane 11/18/2020   Positive occult stool blood test 01/28/2020   Epigastric pain 11/01/2019   Lesion of pancreas 11/01/2019   Mixed hyperlipidemia 08/02/2017   Glaucoma 08/02/2017    Past Surgical History:  Procedure Laterality Date   BACK SURGERY     CATARACT EXTRACTION Bilateral 2007   COLONOSCOPY  11/04/2007   Dr. Cleotis Nipper, normal exam.  Recommended repeat colonoscopy in 10 years.   COLONOSCOPY WITH PROPOFOL N/A 02/23/2020   Procedure: COLONOSCOPY WITH PROPOFOL;  Surgeon: Lanelle Bal, DO;  Location: [facility];  Service: Endoscopy;  Laterality: N/A;  7:30am   NASAL ENDOSCOPY WITH EPISTAXIS CONTROL Right 10/11/2017   Procedure: NASAL CAUTERY AND  BIOPSY OF RIGHT NASAL GRANULATION TISSUE;  Surgeon: Newman Pies, MD;  Location: [facility];  Service: ENT;  Laterality: Right;   NASAL ENDOSCOPY WITH EPISTAXIS CONTROL Right 10/22/2017   Procedure: NASAL ENDOSCOPY WITH RIGHT EPISTAXIS CONTROL;  Surgeon: Newman Pies, MD;  Location: [facility];  Service: ENT;  Laterality: Right;   POLYPECTOMY  02/23/2020   Procedure: POLYPECTOMY;  Surgeon: Lanelle Bal, DO;  Location: [facility];  Service: Endoscopy;;   TONSILLECTOMY  1950       Home Medications    Prior to Admission medications   Medication Sig Start Date End Date Taking? Authorizing Provider  cephALEXin (KEFLEX) 500 MG capsule Take 1 capsule (500 mg total) by mouth  3 (three) times daily. 09/08/22  Yes Manon Banbury, Noberto Retort, PA-C  mupirocin ointment (BACTROBAN) 2 % SMARTSIG:Sparingly Topical 3 Times Daily 09/04/22  Yes [provider]  acetaminophen (TYLENOL) 500 MG tablet Take 500-1,000 mg by mouth every 6 (six) hours as needed (for pain.).    [provider]  atorvastatin (LIPITOR) 10 MG tablet Take 5 mg by mouth at bedtime.  08/16/16   [provider]  dorzolamide-timolol (COSOPT) 22.3-6.8 MG/ML ophthalmic solution Place 1 drop into the left eye 2 (two) times daily.    [provider]  latanoprost (XALATAN) 0.005 % ophthalmic solution Place 1 drop into both eyes at bedtime.  07/18/16   [provider]  Multiple Vitamin (MULTIVITAMIN WITH MINERALS) TABS tablet Take 1 tablet by mouth daily.    [provider]  naproxen sodium (ALEVE) 220 MG tablet Take 220-440 mg by mouth 2 (two) times daily as needed (pain.).    [provider]  sildenafil (REVATIO) 20 MG tablet SMARTSIG:1-5 Tablet(s) By Mouth PRN    [provider]    Family History Family History  Problem Relation Age of Onset   Cerebral aneurysm Mother    Heart disease Father    Stroke Father    Neuropathy Neg Hx    Colon cancer Neg Hx     Social History Social History   Tobacco Use   Smoking status: Never   Smokeless tobacco: Never  Vaping Use   Vaping Use: Never used  Substance Use Topics   Alcohol use: No   Drug use: No     Allergies   Codeine   Review of Systems Review of Systems  Constitutional:  Positive for activity change. Negative for appetite change, fatigue and fever.  Eyes:  Negative for visual disturbance.  Respiratory:  Negative for cough and shortness of breath.   Cardiovascular:  Negative for chest pain, palpitations and leg swelling.  Musculoskeletal:  Negative for arthralgias and myalgias.  Skin:  Positive for color change and wound.  Neurological:  Negative for dizziness, light-headedness, numbness and headaches.     Physical Exam Triage Vital Signs ED Triage Vitals  Enc Vitals Group     BP 09/08/22 0909 (!) 178/81     Pulse Rate 09/08/22 0909 65     Resp 09/08/22 0911 18     Temp 09/08/22 0909 98 F (36.7 C)     Temp Source 09/08/22 0909 Oral     SpO2 09/08/22 0909 95 %     Weight --      Height --      Head Circumference --      Peak Flow --      Pain Score 09/08/22 0911 8     Pain Loc --      Pain Edu? --      Excl. in GC? --    No data found.  Updated Vital Signs BP (!) 168/82 (BP Location: Right Arm)    Pulse 65   Temp 98 F (36.7 C) (Oral)   Resp 18   SpO2 95%   Visual Acuity Right Eye Distance:   Left Eye Distance:   Bilateral Distance:    Right Eye Near:   Left Eye Near:    Bilateral Near:     Physical Exam Vitals reviewed.  Constitutional:      General: He is awake.     Appearance: Normal appearance. He is well-developed. He is not ill-appearing.     Comments: Very pleasant male  appears stated age in no acute distress sitting comfortably in exam room  HENT:     Head: Normocephalic and atraumatic.  Cardiovascular:     Rate and Rhythm: Normal rate and regular rhythm.     Heart sounds: Normal heart sounds, S1 normal and S2 normal. No murmur heard.    Comments: 1+ pitting edema surrounding wound. Pulmonary:     Effort: Pulmonary effort is normal.     Breath sounds: Normal breath sounds. No stridor. No wheezing, rhonchi or rales.     Comments: Clear to auscultation bilaterally Skin:         Comments: Approximately 6 cm x 4 cm wound with surrounding erythema and edema.  No streaking or evidence of lymphangitis.  Wound is covered by a thin layer of epidermis with some serous fluid accumulation.  No active bleeding or drainage noted.  Neurological:     Mental Status: He is alert.  Psychiatric:        Behavior: Behavior is cooperative.      UC Treatments / Results  Labs (all labs ordered are listed, but only abnormal results are displayed) Labs Reviewed - No data to display  EKG   Radiology No results found.  Procedures Procedures (including critical care time)  Medications Ordered in UC Medications - No data to display  Initial Impression / Assessment and Plan / UC Course  I have reviewed the triage vital signs and the nursing notes.  Pertinent labs & imaging results that were available during my care of the patient were reviewed by me and considered in my medical decision making (see chart for details).     Concern for cellulitis given increasing pain  with associated surrounding erythema.  Patient denies any history of MRSA denies any significant risk factors.  Will start cephalexin 500 mg 3 times daily for 1 week.  He was encouraged to keep area clean with soap and water and apply Bactroban ointment twice daily with dressing changes.  He is to monitor the area of erythema and we discussed that if this continues to spread or he has any worsening symptoms including increasing pain, change in character of drainage, fever, nausea, vomiting he should be seen immediately.  Strict return precautions given.  Recommended that he follow-up with his primary care next week.  His blood pressure was elevated today.  Discussed that it has been elevated at his last visit as well and it is reasonable to start medication.  Patient reports that this is generally normal at home and so he believes it is related to whitecoat syndrome.  Recommended that he continue monitoring his blood pressure at home and if this is persistently above 140/90 he should be reevaluated to consider initiation of medication.  He is to avoid decongestants, caffeine, sodium.  Recommended he follow-up with his primary care.  Discussed that if he develops any chest pain, shortness of breath, headache, vision change, dizziness in the setting of high blood pressure he needs to be seen immediately to which he expressed understanding.  Final Clinical Impressions(s) / UC Diagnoses   Final diagnoses:  Cellulitis of leg, right  Elevated blood pressure reading     Discharge Instructions      Keep this area clean with soap and water.  Continue applying Bactroban ointment twice daily with dressing changes.  Start cephalexin 3 times daily for 1 week.  Take this with food as it can upset your stomach.  Monitor the area of redness.  If this continues to  spread even after starting antibiotics he should be seen immediately.  If you have any worsening symptoms including increasing pain, spread of redness,  fever, nausea, vomiting he should be seen immediately.  Follow-up with your primary care next week.  Your blood pressure is elevated.  Please monitor this at home.  If you develop any headache, chest pain, shortness of breath, dizziness, visual disturbance in the setting of high blood pressure you need to be seen immediately.  If your blood pressure is above 140/90 consistently please return here see your primary care to consider medication.     ED Prescriptions     Medication Sig Dispense Auth. Provider   cephALEXin (KEFLEX) 500 MG capsule Take 1 capsule (500 mg total) by mouth 3 (three) times daily. 21 capsule Laurana Magistro K, PA-C      PDMP not reviewed this encounter.   Jeani Hawking, PA-C 09/08/22 1002

## 2022-09-08 NOTE — Discharge Instructions (Signed)
Keep this area clean with soap and water.  Continue applying Bactroban ointment twice daily with dressing changes.  Start cephalexin 3 times daily for 1 week.  Take this with food as it can upset your stomach.  Monitor the area of redness.  If this continues to spread even after starting antibiotics he should be seen immediately.  If you have any worsening symptoms including increasing pain, spread of redness, fever, nausea, vomiting he should be seen immediately.  Follow-up with your primary care next week.  Your blood pressure is elevated.  Please monitor this at home.  If you develop any headache, chest pain, shortness of breath, dizziness, visual disturbance in the setting of high blood pressure you need to be seen immediately.  If your blood pressure is above 140/90 consistently please return here see your primary care to consider medication.

## 2022-09-11 ENCOUNTER — Encounter (HOSPITAL_COMMUNITY): Payer: Self-pay | Admitting: Emergency Medicine

## 2022-09-11 ENCOUNTER — Emergency Department (HOSPITAL_COMMUNITY)
Admission: EM | Admit: 2022-09-11 | Discharge: 2022-09-11 | Disposition: A | Discharge status: Home / Self Care | Payer: PPO | Attending: Emergency Medicine | Admitting: Emergency Medicine

## 2022-09-11 ENCOUNTER — Other Ambulatory Visit: Payer: Self-pay

## 2022-09-11 DIAGNOSIS — M7989 Other specified soft tissue disorders: Secondary | ICD-10-CM | POA: Diagnosis present

## 2022-09-11 DIAGNOSIS — L03115 Cellulitis of right lower limb: Secondary | ICD-10-CM | POA: Diagnosis not present

## 2022-09-11 MED ORDER — DOXYCYCLINE HYCLATE 100 MG PO CAPS: 100.0000 mg | ORAL_CAPSULE | Freq: Two times a day (BID) | ORAL | 0 refills | Status: DC

## 2022-09-11 NOTE — ED Triage Notes (Addendum)
Pt here with "infection" to shin of Rt leg. States he was recently seen at Urgent Care and dx with cellulitis-has prescriptions for Cephalexin 500mg  and Mupirocin Ointment. Returns tonight for follow-up. States he wants to see if "we can take care of it". Pt states leg is draining mustard colored liquid.

## 2022-09-11 NOTE — ED Provider Notes (Signed)
[place] EM[facility]Y DEPARTMENT AT [facility] Provider Note   CSN: [id number] Arrival date & time: 09/11/22  9629     History  Chief complaint-leg pain and infection   Roberto Rosales is a 77 y.o. male.  HPI Patient with history of hyperlipidemia and neuropathy presents with a wound on his right leg.  Patient reports about 2 weeks ago he bumped his right leg and developed a small wound.  No other trauma was reported.  He then reports a blister formed that started to drain.  He reports pain and redness worsened and he was seen in urgent care on May 3.  He was placed on topical Bactroban and oral Keflex 3 times daily.  He has been on the Keflex about 48 hours Denies history of diabetes.  No fevers or vomiting.   Past Medical History:  Diagnosis Date   Epistaxis    Glaucoma    High cholesterol    Neuropathy     Home Medications Prior to Admission medications   Medication Sig Start Date End Date Taking? Authorizing Provider  doxycycline (VIBRAMYCIN) 100 MG capsule Take 1 capsule (100 mg total) by mouth 2 (two) times daily. 09/11/22  Yes Zadie Rhine, MD  acetaminophen (TYLENOL) 500 MG tablet Take 500-1,000 mg by mouth every 6 (six) hours as needed (for pain.).    [provider]  atorvastatin (LIPITOR) 10 MG tablet Take 5 mg by mouth at bedtime.  08/16/16   [provider]  cephALEXin (KEFLEX) 500 MG capsule Take 1 capsule (500 mg total) by mouth 3 (three) times daily. 09/08/22   Raspet, Erin K, PA-C  dorzolamide-timolol (COSOPT) 22.3-6.8 MG/ML ophthalmic solution Place 1 drop into the left eye 2 (two) times daily.    [provider]  latanoprost (XALATAN) 0.005 % ophthalmic solution Place 1 drop into both eyes at bedtime.  07/18/16   [provider]  Multiple Vitamin (MULTIVITAMIN WITH MINERALS) TABS tablet Take 1 tablet by mouth daily.    [provider]  mupirocin ointment (BACTROBAN) 2 % SMARTSIG:Sparingly Topical 3 Times Daily  09/04/22   [provider]  naproxen sodium (ALEVE) 220 MG tablet Take 220-440 mg by mouth 2 (two) times daily as needed (pain.).    [provider]  sildenafil (REVATIO) 20 MG tablet SMARTSIG:1-5 Tablet(s) By Mouth PRN    [provider]      Allergies    Codeine    Review of Systems   Review of Systems  Constitutional:  Negative for chills and fever.  Skin:  Positive for wound.    Physical Exam Updated Vital Signs BP (!) 168/92   Pulse 74   Temp 97.9 F (36.6 C) (Oral)   Resp 18   Ht 1.829 m (6')   Wt 87.1 kg   SpO2 100%   BMI 26.04 kg/m  Physical Exam CONSTITUTIONAL: Elderly, well-appearing HEAD: Normocephalic/atraumatic EYES: EOMI NEURO: Pt is awake/alert/appropriate, moves all extremitiesx4.  No facial droop.   EXTREMITIES: pulses normal/equal, full ROM Distal pulses equal intact in both feet.  Symmetric pitting edema noted to bilateral lower extremities.  Mild tenderness noted to the right lower extremity.  There is no crepitus, there is no streaking.  See photo SKIN: warm, color normal, see photo PSYCH: no abnormalities of mood noted, alert and oriented to situation    ED Results / Procedures / Treatments   Labs (all labs ordered are listed, but only abnormal results are displayed) Labs Reviewed - No data to  display  EKG None  Radiology No results found.  Procedures Procedures    Medications Ordered in ED Medications - No data to display  ED Course/ Medical Decision Making/ A&P                             Medical Decision Making Risk Prescription drug management.   Patient presents with concern for worsening infection to his right leg.  He is without fever or chills.  He is not diabetic.  He is in no acute distress.  This appears to be superficial that does not require any surgical drainage.  I have low suspicion for underlying bony involvement.  There is no crepitus. Will add on doxycycline for better MRSA coverage.   He will also continue the Keflex 3 times daily.  We discussed need for PCP follow-up        Final Clinical Impression(s) / ED Diagnoses Final diagnoses:  Cellulitis of right lower extremity    Rx / DC Orders ED Discharge Orders          Ordered    doxycycline (VIBRAMYCIN) 100 MG capsule  2 times daily        09/11/22 4332              Zadie Rhine, MD 09/11/22 [phone]

## 2022-09-14 DIAGNOSIS — S80821A Blister (nonthermal), right lower leg, initial encounter: Secondary | ICD-10-CM | POA: Diagnosis not present

## 2022-09-14 DIAGNOSIS — R238 Other skin changes: Secondary | ICD-10-CM | POA: Diagnosis not present

## 2022-09-14 DIAGNOSIS — X58XXXA Exposure to other specified factors, initial encounter: Secondary | ICD-10-CM | POA: Diagnosis not present

## 2022-09-16 DIAGNOSIS — R030 Elevated blood-pressure reading, without diagnosis of hypertension: Secondary | ICD-10-CM | POA: Diagnosis not present

## 2022-09-16 DIAGNOSIS — L03115 Cellulitis of right lower limb: Secondary | ICD-10-CM | POA: Diagnosis not present

## 2022-09-16 DIAGNOSIS — Z6826 Body mass index (BMI) 26.0-26.9, adult: Secondary | ICD-10-CM | POA: Diagnosis not present

## 2022-09-22 DIAGNOSIS — Z6826 Body mass index (BMI) 26.0-26.9, adult: Secondary | ICD-10-CM | POA: Diagnosis not present

## 2022-09-22 DIAGNOSIS — R030 Elevated blood-pressure reading, without diagnosis of hypertension: Secondary | ICD-10-CM | POA: Diagnosis not present

## 2022-09-22 DIAGNOSIS — L03119 Cellulitis of unspecified part of limb: Secondary | ICD-10-CM | POA: Diagnosis not present

## 2022-09-28 DIAGNOSIS — Z6826 Body mass index (BMI) 26.0-26.9, adult: Secondary | ICD-10-CM | POA: Diagnosis not present

## 2022-09-28 DIAGNOSIS — R030 Elevated blood-pressure reading, without diagnosis of hypertension: Secondary | ICD-10-CM | POA: Diagnosis not present

## 2022-09-28 DIAGNOSIS — L03119 Cellulitis of unspecified part of limb: Secondary | ICD-10-CM | POA: Diagnosis not present

## 2022-11-02 ENCOUNTER — Encounter (INDEPENDENT_AMBULATORY_CARE_PROVIDER_SITE_OTHER): Payer: Self-pay

## 2022-11-02 ENCOUNTER — Encounter (INDEPENDENT_AMBULATORY_CARE_PROVIDER_SITE_OTHER): Payer: PPO | Admitting: Ophthalmology

## 2022-11-02 DIAGNOSIS — H35373 Puckering of macula, bilateral: Secondary | ICD-10-CM | POA: Diagnosis not present

## 2022-11-24 DIAGNOSIS — E7849 Other hyperlipidemia: Secondary | ICD-10-CM | POA: Diagnosis not present

## 2022-11-24 DIAGNOSIS — R5383 Other fatigue: Secondary | ICD-10-CM | POA: Diagnosis not present

## 2022-11-24 DIAGNOSIS — R739 Hyperglycemia, unspecified: Secondary | ICD-10-CM | POA: Diagnosis not present

## 2022-11-24 DIAGNOSIS — E7801 Familial hypercholesterolemia: Secondary | ICD-10-CM | POA: Diagnosis not present

## 2022-12-01 DIAGNOSIS — G629 Polyneuropathy, unspecified: Secondary | ICD-10-CM | POA: Diagnosis not present

## 2022-12-01 DIAGNOSIS — R7303 Prediabetes: Secondary | ICD-10-CM | POA: Diagnosis not present

## 2022-12-01 DIAGNOSIS — H409 Unspecified glaucoma: Secondary | ICD-10-CM | POA: Diagnosis not present

## 2022-12-01 DIAGNOSIS — E7849 Other hyperlipidemia: Secondary | ICD-10-CM | POA: Diagnosis not present

## 2022-12-01 DIAGNOSIS — K862 Cyst of pancreas: Secondary | ICD-10-CM | POA: Diagnosis not present

## 2022-12-01 DIAGNOSIS — I10 Essential (primary) hypertension: Secondary | ICD-10-CM | POA: Diagnosis not present

## 2022-12-01 DIAGNOSIS — R7301 Impaired fasting glucose: Secondary | ICD-10-CM | POA: Diagnosis not present

## 2022-12-01 DIAGNOSIS — Z6826 Body mass index (BMI) 26.0-26.9, adult: Secondary | ICD-10-CM | POA: Diagnosis not present

## 2022-12-08 DIAGNOSIS — Z6826 Body mass index (BMI) 26.0-26.9, adult: Secondary | ICD-10-CM | POA: Diagnosis not present

## 2022-12-08 DIAGNOSIS — R030 Elevated blood-pressure reading, without diagnosis of hypertension: Secondary | ICD-10-CM | POA: Diagnosis not present

## 2022-12-08 DIAGNOSIS — L03115 Cellulitis of right lower limb: Secondary | ICD-10-CM | POA: Diagnosis not present

## 2022-12-12 DIAGNOSIS — L03115 Cellulitis of right lower limb: Secondary | ICD-10-CM | POA: Diagnosis not present

## 2022-12-12 DIAGNOSIS — Z6826 Body mass index (BMI) 26.0-26.9, adult: Secondary | ICD-10-CM | POA: Diagnosis not present

## 2022-12-12 DIAGNOSIS — R030 Elevated blood-pressure reading, without diagnosis of hypertension: Secondary | ICD-10-CM | POA: Diagnosis not present

## 2022-12-15 DIAGNOSIS — Z6827 Body mass index (BMI) 27.0-27.9, adult: Secondary | ICD-10-CM | POA: Diagnosis not present

## 2022-12-15 DIAGNOSIS — L03115 Cellulitis of right lower limb: Secondary | ICD-10-CM | POA: Diagnosis not present

## 2022-12-15 DIAGNOSIS — R030 Elevated blood-pressure reading, without diagnosis of hypertension: Secondary | ICD-10-CM | POA: Diagnosis not present

## 2022-12-19 DIAGNOSIS — L03115 Cellulitis of right lower limb: Secondary | ICD-10-CM | POA: Diagnosis not present

## 2022-12-19 DIAGNOSIS — R030 Elevated blood-pressure reading, without diagnosis of hypertension: Secondary | ICD-10-CM | POA: Diagnosis not present

## 2022-12-19 DIAGNOSIS — Z6826 Body mass index (BMI) 26.0-26.9, adult: Secondary | ICD-10-CM | POA: Diagnosis not present

## 2022-12-23 DIAGNOSIS — R030 Elevated blood-pressure reading, without diagnosis of hypertension: Secondary | ICD-10-CM | POA: Diagnosis not present

## 2022-12-23 DIAGNOSIS — Z6826 Body mass index (BMI) 26.0-26.9, adult: Secondary | ICD-10-CM | POA: Diagnosis not present

## 2022-12-23 DIAGNOSIS — L03115 Cellulitis of right lower limb: Secondary | ICD-10-CM | POA: Diagnosis not present

## 2022-12-28 DIAGNOSIS — Z6826 Body mass index (BMI) 26.0-26.9, adult: Secondary | ICD-10-CM | POA: Diagnosis not present

## 2022-12-28 DIAGNOSIS — R030 Elevated blood-pressure reading, without diagnosis of hypertension: Secondary | ICD-10-CM | POA: Diagnosis not present

## 2022-12-28 DIAGNOSIS — L989 Disorder of the skin and subcutaneous tissue, unspecified: Secondary | ICD-10-CM | POA: Diagnosis not present

## 2023-01-18 DIAGNOSIS — R030 Elevated blood-pressure reading, without diagnosis of hypertension: Secondary | ICD-10-CM | POA: Diagnosis not present

## 2023-01-18 DIAGNOSIS — Z23 Encounter for immunization: Secondary | ICD-10-CM | POA: Diagnosis not present

## 2023-01-18 DIAGNOSIS — Z6826 Body mass index (BMI) 26.0-26.9, adult: Secondary | ICD-10-CM | POA: Diagnosis not present

## 2023-01-18 DIAGNOSIS — L989 Disorder of the skin and subcutaneous tissue, unspecified: Secondary | ICD-10-CM | POA: Diagnosis not present

## 2023-02-03 DIAGNOSIS — Z6826 Body mass index (BMI) 26.0-26.9, adult: Secondary | ICD-10-CM | POA: Diagnosis not present

## 2023-02-03 DIAGNOSIS — L03116 Cellulitis of left lower limb: Secondary | ICD-10-CM | POA: Diagnosis not present

## 2023-02-03 DIAGNOSIS — R030 Elevated blood-pressure reading, without diagnosis of hypertension: Secondary | ICD-10-CM | POA: Diagnosis not present

## 2023-02-03 DIAGNOSIS — I739 Peripheral vascular disease, unspecified: Secondary | ICD-10-CM | POA: Diagnosis not present

## 2023-02-08 DIAGNOSIS — I872 Venous insufficiency (chronic) (peripheral): Secondary | ICD-10-CM | POA: Diagnosis not present

## 2023-02-08 DIAGNOSIS — R030 Elevated blood-pressure reading, without diagnosis of hypertension: Secondary | ICD-10-CM | POA: Diagnosis not present

## 2023-02-08 DIAGNOSIS — L03116 Cellulitis of left lower limb: Secondary | ICD-10-CM | POA: Diagnosis not present

## 2023-02-08 DIAGNOSIS — I739 Peripheral vascular disease, unspecified: Secondary | ICD-10-CM | POA: Diagnosis not present

## 2023-02-16 DIAGNOSIS — I739 Peripheral vascular disease, unspecified: Secondary | ICD-10-CM | POA: Diagnosis not present

## 2023-03-01 DIAGNOSIS — Z23 Encounter for immunization: Secondary | ICD-10-CM | POA: Diagnosis not present

## 2023-03-13 DIAGNOSIS — Z961 Presence of intraocular lens: Secondary | ICD-10-CM | POA: Diagnosis not present

## 2023-03-13 DIAGNOSIS — H401130 Primary open-angle glaucoma, bilateral, stage unspecified: Secondary | ICD-10-CM | POA: Diagnosis not present

## 2023-03-21 DIAGNOSIS — R609 Edema, unspecified: Secondary | ICD-10-CM | POA: Diagnosis not present

## 2023-03-21 DIAGNOSIS — R030 Elevated blood-pressure reading, without diagnosis of hypertension: Secondary | ICD-10-CM | POA: Diagnosis not present

## 2023-03-21 DIAGNOSIS — R238 Other skin changes: Secondary | ICD-10-CM | POA: Diagnosis not present

## 2023-03-21 DIAGNOSIS — Z6826 Body mass index (BMI) 26.0-26.9, adult: Secondary | ICD-10-CM | POA: Diagnosis not present

## 2023-04-02 DIAGNOSIS — Z6827 Body mass index (BMI) 27.0-27.9, adult: Secondary | ICD-10-CM | POA: Diagnosis not present

## 2023-04-02 DIAGNOSIS — L03115 Cellulitis of right lower limb: Secondary | ICD-10-CM | POA: Diagnosis not present

## 2023-04-02 DIAGNOSIS — R030 Elevated blood-pressure reading, without diagnosis of hypertension: Secondary | ICD-10-CM | POA: Diagnosis not present

## 2023-04-04 DIAGNOSIS — Z6827 Body mass index (BMI) 27.0-27.9, adult: Secondary | ICD-10-CM | POA: Diagnosis not present

## 2023-04-04 DIAGNOSIS — G629 Polyneuropathy, unspecified: Secondary | ICD-10-CM | POA: Diagnosis not present

## 2023-04-04 DIAGNOSIS — I83009 Varicose veins of unspecified lower extremity with ulcer of unspecified site: Secondary | ICD-10-CM | POA: Diagnosis not present

## 2023-04-04 DIAGNOSIS — R7303 Prediabetes: Secondary | ICD-10-CM | POA: Diagnosis not present

## 2023-04-04 DIAGNOSIS — I739 Peripheral vascular disease, unspecified: Secondary | ICD-10-CM | POA: Diagnosis not present

## 2023-04-04 DIAGNOSIS — L97909 Non-pressure chronic ulcer of unspecified part of unspecified lower leg with unspecified severity: Secondary | ICD-10-CM | POA: Diagnosis not present

## 2023-04-04 DIAGNOSIS — R030 Elevated blood-pressure reading, without diagnosis of hypertension: Secondary | ICD-10-CM | POA: Diagnosis not present

## 2023-04-05 ENCOUNTER — Inpatient Hospital Stay (HOSPITAL_COMMUNITY)
Admission: EM | Admit: 2023-04-05 | Discharge: 2023-04-07 | DRG: 603 | Disposition: A | Discharge status: Home / Self Care | Payer: PPO | Attending: Family Medicine | Admitting: Family Medicine

## 2023-04-05 ENCOUNTER — Other Ambulatory Visit: Payer: Self-pay

## 2023-04-05 ENCOUNTER — Encounter (HOSPITAL_COMMUNITY): Payer: Self-pay | Admitting: Emergency Medicine

## 2023-04-05 DIAGNOSIS — S80821A Blister (nonthermal), right lower leg, initial encounter: Secondary | ICD-10-CM | POA: Diagnosis not present

## 2023-04-05 DIAGNOSIS — R110 Nausea: Secondary | ICD-10-CM | POA: Insufficient documentation

## 2023-04-05 DIAGNOSIS — L03115 Cellulitis of right lower limb: Principal | ICD-10-CM | POA: Diagnosis present

## 2023-04-05 DIAGNOSIS — H409 Unspecified glaucoma: Secondary | ICD-10-CM | POA: Diagnosis not present

## 2023-04-05 DIAGNOSIS — X58XXXA Exposure to other specified factors, initial encounter: Secondary | ICD-10-CM | POA: Diagnosis present

## 2023-04-05 DIAGNOSIS — Z79899 Other long term (current) drug therapy: Secondary | ICD-10-CM | POA: Diagnosis not present

## 2023-04-05 DIAGNOSIS — I739 Peripheral vascular disease, unspecified: Secondary | ICD-10-CM | POA: Diagnosis present

## 2023-04-05 DIAGNOSIS — Z9841 Cataract extraction status, right eye: Secondary | ICD-10-CM | POA: Diagnosis not present

## 2023-04-05 DIAGNOSIS — Z9842 Cataract extraction status, left eye: Secondary | ICD-10-CM | POA: Diagnosis not present

## 2023-04-05 DIAGNOSIS — E782 Mixed hyperlipidemia: Secondary | ICD-10-CM | POA: Diagnosis not present

## 2023-04-05 DIAGNOSIS — Z885 Allergy status to narcotic agent status: Secondary | ICD-10-CM | POA: Diagnosis not present

## 2023-04-05 DIAGNOSIS — G629 Polyneuropathy, unspecified: Secondary | ICD-10-CM | POA: Diagnosis not present

## 2023-04-05 DIAGNOSIS — Z8249 Family history of ischemic heart disease and other diseases of the circulatory system: Secondary | ICD-10-CM

## 2023-04-05 DIAGNOSIS — S81801A Unspecified open wound, right lower leg, initial encounter: Secondary | ICD-10-CM | POA: Diagnosis present

## 2023-04-05 DIAGNOSIS — I10 Essential (primary) hypertension: Secondary | ICD-10-CM | POA: Diagnosis not present

## 2023-04-05 DIAGNOSIS — L089 Local infection of the skin and subcutaneous tissue, unspecified: Secondary | ICD-10-CM | POA: Diagnosis present

## 2023-04-05 DIAGNOSIS — E871 Hypo-osmolality and hyponatremia: Secondary | ICD-10-CM | POA: Insufficient documentation

## 2023-04-05 DIAGNOSIS — Z7952 Long term (current) use of systemic steroids: Secondary | ICD-10-CM

## 2023-04-05 LAB — CBC WITH DIFFERENTIAL/PLATELET
Abs Immature Granulocytes: 0.03 10*3/uL (ref 0.00–0.07)
Basophils Absolute: 0 10*3/uL (ref 0.0–0.1)
Basophils Relative: 1 %
Eosinophils Absolute: 0.1 10*3/uL (ref 0.0–0.5)
Eosinophils Relative: 1 %
HCT: 40.8 % (ref 39.0–52.0)
Hemoglobin: 14.3 g/dL (ref 13.0–17.0)
Immature Granulocytes: 0 %
Lymphocytes Relative: 12 %
Lymphs Abs: 1 10*3/uL (ref 0.7–4.0)
MCH: 29.6 pg (ref 26.0–34.0)
MCHC: 35 g/dL (ref 30.0–36.0)
MCV: 84.5 fL (ref 80.0–100.0)
Monocytes Absolute: 0.4 10*3/uL (ref 0.1–1.0)
Monocytes Relative: 4 %
Neutro Abs: 7.2 10*3/uL (ref 1.7–7.7)
Neutrophils Relative %: 82 %
Platelets: 182 10*3/uL (ref 150–400)
RBC: 4.83 MIL/uL (ref 4.22–5.81)
RDW: 13.3 % (ref 11.5–15.5)
WBC: 8.8 10*3/uL (ref 4.0–10.5)
nRBC: 0 % (ref 0.0–0.2)

## 2023-04-05 LAB — SODIUM, URINE, RANDOM: Sodium, Ur: 143 mmol/L

## 2023-04-05 LAB — BASIC METABOLIC PANEL
Anion gap: 9 (ref 5–15)
BUN: 21 mg/dL (ref 8–23)
CO2: 20 mmol/L — ABNORMAL LOW (ref 22–32)
Calcium: 9.1 mg/dL (ref 8.9–10.3)
Chloride: 102 mmol/L (ref 98–111)
Creatinine, Ser: 0.96 mg/dL (ref 0.61–1.24)
GFR, Estimated: >60 mL/min (ref >60)
Glucose, Bld: 126 mg/dL — ABNORMAL HIGH (ref 70–99)
Potassium: 4.1 mmol/L (ref 3.5–5.1)
Sodium: 131 mmol/L — ABNORMAL LOW (ref 135–145)

## 2023-04-05 LAB — LACTIC ACID, PLASMA: Lactic Acid, Venous: 1.3 mmol/L (ref 0.5–1.9)

## 2023-04-05 MED ORDER — SODIUM CHLORIDE 0.9 % IV BOLUS
1000.0000 mL | Freq: Once | INTRAVENOUS | Status: AC
  Administered 2023-04-05: 1000 mL via INTRAVENOUS

## 2023-04-05 MED ORDER — VANCOMYCIN HCL 2000 MG/400ML IV SOLN
2000.0000 mg | Freq: Once | INTRAVENOUS | Status: AC
  Administered 2023-04-05: 2000 mg via INTRAVENOUS

## 2023-04-05 MED ORDER — ATORVASTATIN CALCIUM 10 MG PO TABS
5.0000 mg | ORAL_TABLET | Freq: Every day | ORAL | Status: DC
  Administered 2023-04-05 – 2023-04-06 (×2): 5 mg via ORAL
  Filled 2023-04-05 (×2): qty 1

## 2023-04-05 MED ORDER — ORAL CARE MOUTH RINSE: 15.0000 mL | OROMUCOSAL | Status: DC | PRN

## 2023-04-05 MED ORDER — ONDANSETRON HCL 4 MG/2ML IJ SOLN: 4.0000 mg | Freq: Four times a day (QID) | INTRAMUSCULAR | Status: DC | PRN

## 2023-04-05 MED ORDER — ONDANSETRON HCL 4 MG/2ML IJ SOLN
4.0000 mg | Freq: Once | INTRAMUSCULAR | Status: AC
  Administered 2023-04-05: 4 mg via INTRAVENOUS
  Filled 2023-04-05: qty 2

## 2023-04-05 MED ORDER — ACETAMINOPHEN 650 MG RE SUPP: 650.0000 mg | Freq: Four times a day (QID) | RECTAL | Status: DC | PRN

## 2023-04-05 MED ORDER — LATANOPROST 0.005 % OP SOLN
1.0000 [drp] | Freq: Every day | OPHTHALMIC | Status: DC
  Administered 2023-04-05 – 2023-04-06 (×2): 1 [drp] via OPHTHALMIC
  Filled 2023-04-05: qty 2.5

## 2023-04-05 MED ORDER — VANCOMYCIN HCL IN DEXTROSE 1-5 GM/200ML-% IV SOLN
1000.0000 mg | Freq: Two times a day (BID) | INTRAVENOUS | Status: DC
  Administered 2023-04-06 – 2023-04-07 (×3): 1000 mg via INTRAVENOUS
  Filled 2023-04-05 (×3): qty 200

## 2023-04-05 MED ORDER — ACETAMINOPHEN 325 MG PO TABS: 650.0000 mg | ORAL_TABLET | Freq: Four times a day (QID) | ORAL | Status: DC | PRN

## 2023-04-05 MED ORDER — ENOXAPARIN SODIUM 40 MG/0.4ML IJ SOSY
40.0000 mg | PREFILLED_SYRINGE | INTRAMUSCULAR | Status: DC
  Administered 2023-04-05 – 2023-04-06 (×2): 40 mg via SUBCUTANEOUS
  Filled 2023-04-05 (×2): qty 0.4

## 2023-04-05 MED ORDER — DORZOLAMIDE HCL-TIMOLOL MAL 2-0.5 % OP SOLN
1.0000 [drp] | Freq: Two times a day (BID) | OPHTHALMIC | Status: DC
  Administered 2023-04-05 – 2023-04-07 (×3): 1 [drp] via OPHTHALMIC
  Filled 2023-04-05: qty 10

## 2023-04-05 MED ORDER — ONDANSETRON HCL 4 MG PO TABS: 4.0000 mg | ORAL_TABLET | Freq: Four times a day (QID) | ORAL | Status: DC | PRN

## 2023-04-05 NOTE — H&P (Signed)
History and Physical    Patient: Roberto Rosales DOB: [birth year] DOA: 04/05/2023 DOS: the patient was seen and examined on 04/05/2023 PCP: [facility]  Patient coming from: Home  Chief Complaint:  Chief Complaint  Patient presents with   Wound Infection   HPI: Roberto Rosales is a 77 y.o. male with medical history significant of peripheral neuropathy, hyperlipidemia who presents to the emergency department due to worsening right lower extremity wound infection.  He complained of a blister on his right lower leg which became itchy on Saturday (11/23), he scratched it slightly, but it bursts releasing a yellowish purulent discharge.  He went to his PCP on Monday (11/25) and was started on doxycycline, this was switched to Bactrim yesterday (9/27).  He complained of nausea and shaking chills at home within the last 24 hours with a superimposed erythema around the wound on the leg.  Patient decided to go to the ED for further evaluation and management.  He denies fever, chest pain, shortness of breath, headache.  ED Course:  In the emergency department, BP was 179/69, respiratory rate was 22/min, but other vital signs were within normal range.  Workup in the ED showed normal CBC and BMP except for sodium of 131, bicarb of 20, blood glucose 126.  Lactic acid was normal at 1.3.  Blood culture pending. Patient was treated with IV Zofran 4 mg x 1, he was started on IV vancomycin.  IV NS 1 L was provided. Hospitalist was asked to admit patient for further evaluation and management.  Review of Systems: Review of systems as noted in the HPI. All other systems reviewed and are negative.   Past Medical History:  Diagnosis Date   Epistaxis    Glaucoma    High cholesterol    Neuropathy    Past Surgical History:  Procedure Laterality Date   BACK SURGERY     CATARACT EXTRACTION Bilateral 2007   COLONOSCOPY  11/04/2007   Dr. Cleotis Nipper, normal exam.  Recommended repeat  colonoscopy in 10 years.   COLONOSCOPY WITH PROPOFOL N/A 02/23/2020   Procedure: COLONOSCOPY WITH PROPOFOL;  Surgeon: Lanelle Bal, DO;  [facility];  Service: Endoscopy;  Laterality: N/A;  7:30am   NASAL ENDOSCOPY WITH EPISTAXIS CONTROL Right 10/11/2017   Procedure: NASAL CAUTERY AND  BIOPSY OF RIGHT NASAL GRANULATION TISSUE;  Surgeon: Newman Pies, MD;  Location: [facility];  Service: ENT;  Laterality: Right;   NASAL ENDOSCOPY WITH EPISTAXIS CONTROL Right 10/22/2017   Procedure: NASAL ENDOSCOPY WITH RIGHT EPISTAXIS CONTROL;  Surgeon: Newman Pies, MD;  Location: [facility];  Service: ENT;  Laterality: Right;   POLYPECTOMY  02/23/2020   Procedure: POLYPECTOMY;  Surgeon: Lanelle Bal, DO;  [facility];  Service: Endoscopy;;   TONSILLECTOMY  1950    Social History:  reports that he has never smoked. He has never used smokeless tobacco. He reports that he does not drink alcohol and does not use drugs.   Allergies  Allergen Reactions   Codeine Nausea And Vomiting    Family History  Problem Relation Age of Onset   Cerebral aneurysm Mother    Heart disease Father    Stroke Father    Neuropathy Neg Hx    Colon cancer Neg Hx      Prior to Admission medications   Medication Sig Start Date End Date Taking? Authorizing Provider  furosemide (LASIX) 20 MG tablet Take 20 mg by mouth daily. 03/21/23  Yes [provider]  sulfamethoxazole-trimethoprim (BACTRIM DS) 800-160 MG tablet Take 2 tablets by mouth 2 (two) times daily. 04/04/23  Yes [provider]  traMADol (ULTRAM) 50 MG tablet Take 50 mg by mouth 3 (three) times daily as needed. 04/04/23  Yes [provider]  acetaminophen (TYLENOL) 500 MG tablet Take 500-1,000 mg by mouth every 6 (six) hours as needed (for pain.).    [provider]  atorvastatin (LIPITOR) 10 MG tablet Take 5 mg by mouth at bedtime.  08/16/16   [provider]  cephALEXin (KEFLEX) 500 MG  capsule Take 1 capsule (500 mg total) by mouth 3 (three) times daily. 09/08/22   Raspet, Erin K, PA-C  dorzolamide-timolol (COSOPT) 22.3-6.8 MG/ML ophthalmic solution Place 1 drop into the left eye 2 (two) times daily.    [provider]  doxycycline (VIBRAMYCIN) 100 MG capsule Take 1 capsule (100 mg total) by mouth 2 (two) times daily. 09/11/22   Zadie Rhine, MD  latanoprost (XALATAN) 0.005 % ophthalmic solution Place 1 drop into both eyes at bedtime.  07/18/16   [provider]  Multiple Vitamin (MULTIVITAMIN WITH MINERALS) TABS tablet Take 1 tablet by mouth daily.    [provider]  naproxen sodium (ALEVE) 220 MG tablet Take 220-440 mg by mouth 2 (two) times daily as needed (pain.).    [provider]  predniSONE (DELTASONE) 20 MG tablet Take 20 mg by mouth daily with breakfast. 02/08/23   [provider]  sildenafil (REVATIO) 20 MG tablet SMARTSIG:1-5 Tablet(s) By Mouth PRN    [provider]  triamcinolone cream (KENALOG) 0.1 % Apply 1 Application topically 3 (three) times daily. 03/22/23   [provider]    Physical Exam: BP (!) 143/67   Pulse (!) 48   Temp 97.8 F (36.6 C) (Oral)   Resp 12   Ht 6' (1.829 m)   Wt 87 kg   SpO2 97%   BMI 26.01 kg/m   General: 77 y.o. year-old male well developed well nourished in no acute distress.  Alert and oriented x3. HEENT: NCAT, EOMI Neck: Supple, trachea medial Cardiovascular: Regular rate and rhythm with no rubs or gallops.  No thyromegaly or JVD noted.  No lower extremity edema. 2/4 pulses in all 4 extremities. Respiratory: Clear to auscultation with no wheezes or rales. Good inspiratory effort. Abdomen: Soft, nontender nondistended with normal bowel sounds x4 quadrants. Muskuloskeletal: No cyanosis, clubbing or edema noted bilaterally Neuro: CN II-XII intact, strength 5/5 x 4, sensation, reflexes intact Skin: No ulcerative lesions noted or rashes Psychiatry: Judgement and  insight appear normal. Mood is appropriate for condition and setting          Labs on Admission:  Basic Metabolic Panel: Recent Labs  Lab 04/05/23 1821  NA 131*  K 4.1  CL 102  CO2 20*  GLUCOSE 126*  BUN 21  CREATININE 0.96  CALCIUM 9.1   Liver Function Tests: No results for input(s): "AST", "ALT", "ALKPHOS", "BILITOT", "PROT", "ALBUMIN" in the last 168 hours. No results for input(s): "LIPASE", "AMYLASE" in the last 168 hours. No results for input(s): "AMMONIA" in the last 168 hours. CBC: Recent Labs  Lab 04/05/23 1821  WBC 8.8  NEUTROABS 7.2  HGB 14.3  HCT 40.8  MCV 84.5  PLT 182   Cardiac Enzymes: No results for input(s): "CKTOTAL", "CKMB", "CKMBINDEX", "TROPONINI" in the last 168 hours.  BNP (last 3 results) No results for input(s): "BNP" in the last 8760 hours.  ProBNP (last  3 results) No results for input(s): "PROBNP" in the last 8760 hours.  CBG: No results for input(s): "GLUCAP" in the last 168 hours.  Radiological Exams on Admission: No results found.  EKG: I independently viewed the EKG done and my findings are as followed: EKG was not done in the ED  Assessment/Plan Present on Admission:  Cellulitis of right lower extremity  Mixed hyperlipidemia  Glaucoma  Principal Problem:   Cellulitis of right lower extremity Active Problems:   Mixed hyperlipidemia   Glaucoma   Open wound of right lower extremity   Nausea   Hyponatremia  Right lower extremity cellulitis Open wound of right lower extremity Continue wound care Continue IV vancomycin  Nausea in the setting of above Continue Zofran as needed  Hyponatremia Na 131, IV hydration was provided Continue to monitor sodium with serial BMPs Urine osmolality, serum osmolality and urine sodium will be checked  Mixed hyperlipidemia Continue Lipitor  Glaucoma Continue Cosopt and latanoprost  DVT prophylaxis: Lovenox  Code Status: Full code  Family Communication: None at  bedside  Consults: None  Severity of Illness: The appropriate patient status for this patient is INPATIENT. Inpatient status is judged to be reasonable and necessary in order to provide the required intensity of service to ensure the patient's safety. The patient's presenting symptoms, physical exam findings, and initial radiographic and laboratory data in the context of their chronic comorbidities is felt to place them at high risk for further clinical deterioration. Furthermore, it is not anticipated that the patient will be medically stable for discharge from the hospital within 2 midnights of admission.   * I certify that at the point of admission it is my clinical judgment that the patient will require inpatient hospital care spanning beyond 2 midnights from the point of admission due to high intensity of service, high risk for further deterioration and high frequency of surveillance required.*  Author: Frankey Shown, DO 04/05/2023 8:14 PM  For on call review [email or web address].

## 2023-04-05 NOTE — ED Provider Notes (Signed)
[place] EMERGENCY DEPARTMENT AT [facility] Provider Note   CSN: [id number] Arrival date & time: 04/05/23  1706     History  Chief Complaint  Patient presents with   Wound Infection    Roberto Rosales is a 77 y.o. male.  With a history of peripheral neuropathy and hyperlipidemia who presents to the ED for right lower extremity infection.  Patient first noticed a small lesion that looked like a pimple on his right lower leg 5 days ago.  He scratched the lesion and some yellow purulent drainage was expressed.  Since that time the wound has not healed and he was started on outpatient antibiotics.  He started taking Keflex 3 days ago and was switched to Bactrim by his PCP.  Over the last 24 hours has developed shaking chills and nausea at home.  Right lower extremities persistently erythematous but is not significantly uncomfortable for him.  No chest pain trouble breathing or other complaints at this time.  HPI     Home Medications Prior to Admission medications   Medication Sig Start Date End Date Taking? Authorizing Provider  acetaminophen (TYLENOL) 500 MG tablet Take 500-1,000 mg by mouth every 6 (six) hours as needed (for pain.).    [provider]  atorvastatin (LIPITOR) 10 MG tablet Take 5 mg by mouth at bedtime.  08/16/16   [provider]  cephALEXin (KEFLEX) 500 MG capsule Take 1 capsule (500 mg total) by mouth 3 (three) times daily. 09/08/22   Raspet, Erin K, PA-C  dorzolamide-timolol (COSOPT) 22.3-6.8 MG/ML ophthalmic solution Place 1 drop into the left eye 2 (two) times daily.    [provider]  doxycycline (VIBRAMYCIN) 100 MG capsule Take 1 capsule (100 mg total) by mouth 2 (two) times daily. 09/11/22   Zadie Rhine, MD  latanoprost (XALATAN) 0.005 % ophthalmic solution Place 1 drop into both eyes at bedtime.  07/18/16   [provider]  Multiple Vitamin (MULTIVITAMIN WITH MINERALS) TABS tablet Take 1 tablet by mouth daily.     [provider]  mupirocin ointment (BACTROBAN) 2 % SMARTSIG:Sparingly Topical 3 Times Daily 09/04/22   [provider]  naproxen sodium (ALEVE) 220 MG tablet Take 220-440 mg by mouth 2 (two) times daily as needed (pain.).    [provider]  sildenafil (REVATIO) 20 MG tablet SMARTSIG:1-5 Tablet(s) By Mouth PRN    [provider]      Allergies    Codeine    Review of Systems   Review of Systems  Physical Exam Updated Vital Signs BP (!) 153/60 (BP Location: Right Arm)   Pulse (!) 50   Temp 97.8 F (36.6 C) (Oral)   Resp 10   Ht 6' (1.829 m)   Wt 87 kg   SpO2 97%   BMI 26.01 kg/m  Physical Exam Vitals and nursing note reviewed.  HENT:     Head: Normocephalic and atraumatic.  Eyes:     Pupils: Pupils are equal, round, and reactive to light.  Cardiovascular:     Rate and Rhythm: Normal rate and regular rhythm.  Pulmonary:     Effort: Pulmonary effort is normal.     Breath sounds: Normal breath sounds.  Abdominal:     Palpations: Abdomen is soft.     Tenderness: There is no abdominal tenderness.  Musculoskeletal:     Comments: 5-5 motor strength bilateral lower extremities 2+ DP pulse bilaterally Sensation tact light touch throughout lower extremities  Skin:  General: Skin is warm and dry.     Comments: Large area of erythema and increased warmth over right lower leg with scant purulent drainage.  Dressing in place.  Neurological:     Mental Status: He is alert.  Psychiatric:        Mood and Affect: Mood normal.     ED Results / Procedures / Treatments   Labs (all labs ordered are listed, but only abnormal results are displayed) Labs Reviewed  BASIC METABOLIC PANEL - Abnormal; Notable for the following components:      Result Value   Sodium 131 (*)    CO2 20 (*)    Glucose, Bld 126 (*)    All other components within normal limits  CULTURE, BLOOD (ROUTINE X 2)  CULTURE, BLOOD (ROUTINE X 2)  CBC WITH DIFFERENTIAL/PLATELET   LACTIC ACID, PLASMA  LACTIC ACID, PLASMA    EKG None  Radiology No results found.  Procedures Procedures    Medications Ordered in ED Medications  vancomycin (VANCOREADY) IVPB 2000 mg/400 mL (2,000 mg Intravenous New Bag/Given 04/05/23 1857)  vancomycin (VANCOCIN) IVPB 1000 mg/200 mL premix (has no administration in time range)  sodium chloride 0.9 % bolus 1,000 mL (1,000 mLs Intravenous New Bag/Given 04/05/23 1824)  ondansetron (ZOFRAN) injection 4 mg (4 mg Intravenous Given 04/05/23 1822)    ED Course/ Medical Decision Making/ A&P Clinical Course as of 04/05/23 1919  Thu Apr 05, 2023  1918 No elevation in venous lactate or significant leukocytosis on laboratory workup.  Vancomycin in process.  Patient will be admitted to medicine for outpatient treatment failure of right lower extremity cellulitis [MP]    Clinical Course User Index [MP] Royanne Foots, DO                                 Medical Decision Making 77 year old male with history as above presenting for treatment resistant right lower extremity cellulitis.  Has been taking Keflex and now Bactrim with no resolution.  Developed shaking chills and nausea at home today.  Presentation concerning for treatment resistant cellulitis with potential for systemic infection.  Will obtain broad infectious laboratory workup including CBC, CMP, lactate and blood cultures.  Will provide Zofran for nausea and start him on antibiotics here.  Admission for IV antibiotics likely  Amount and/or Complexity of Data Reviewed Labs: ordered.  Risk Prescription drug management. Decision regarding hospitalization.           Final Clinical Impression(s) / ED Diagnoses Final diagnoses:  Cellulitis of right lower extremity    Rx / DC Orders ED Discharge Orders     None         Royanne Foots, DO 04/05/23 1919

## 2023-04-05 NOTE — ED Notes (Signed)
Vancomycin not available on the unit. [facility] Supervisor called and notified that a dose is needed. [facility] Supervisor to follow up with this RN

## 2023-04-05 NOTE — Progress Notes (Signed)
Pharmacy Antibiotic Note  Roberto Rosales is a 77 y.o. male admitted on 04/05/2023 with purulent right lower leg cellulitis.  Pharmacy has been consulted for vancomycin dosing.  Plan: Vancomycin 2g IV x 1 then Vancomycin 1000mg  IV q12h estAUC 525.5 (goal AUC 400-550) Monitor clinical picture, renal function, vanc levels if needed F/U C&S, abx de-escalation, LOT    Height: 6' (182.9 cm) Weight: 87 kg (191 lb 12.8 oz) IBW/kg (Calculated) : 77.6  Temp (24hrs), Avg:97.8 F (36.6 C), Min:97.8 F (36.6 C), Max:97.8 F (36.6 C)  No results for input(s): "WBC", "CREATININE", "LATICACIDVEN", "VANCOTROUGH", "VANCOPEAK", "VANCORANDOM", "GENTTROUGH", "GENTPEAK", "GENTRANDOM", "TOBRATROUGH", "TOBRAPEAK", "TOBRARND", "AMIKACINPEAK", "AMIKACINTROU", "AMIKACIN" in the last 168 hours.  CrCl cannot be calculated (Patient's most recent lab result is older than the maximum 21 days allowed.).    Allergies  Allergen Reactions   Codeine Nausea Only    Antimicrobials this admission: Vancomycin 11/28>>  Dose adjustments this admission: N/a  Microbiology results: 11/28 BCx pending  Thank you for allowing pharmacy to be a part of this patient's care.  Doroteo Glassman 04/05/2023 6:01 PM

## 2023-04-05 NOTE — ED Notes (Addendum)
ED TO INPATIENT HANDOFF REPORT  ED Nurse Name and Phone #: Jacques Earthly Name/Age/Gender Roberto Rosales 77 y.o. male Room/Bed: APA09/APA09  Code Status   Code Status: Prior  Home/SNF/Other Home Patient oriented to: self, place, time, and situation Is this baseline? Yes   Triage Complete: Triage complete  Chief Complaint Cellulitis of right lower extremity [L03.115]  Triage Note Pt has wound the right lower leg and is currently on antibiotics. Pt developed chills and nausea today.    Allergies Allergies  Allergen Reactions   Codeine Nausea And Vomiting    Level of Care/Admitting Diagnosis ED Disposition     ED Disposition  Admit   Condition  --   Comment  Hospital [facility]: [facility] [[id number]]  Level of Care: Med-Surg [16]  Covid Evaluation: Asymptomatic - no recent exposure (last 10 days) testing not required  Diagnosis: Cellulitis of right lower extremity [409811]  Admitting Physician: Frankey Shown [[id number]]  Attending Physician: Frankey Shown [[id number]]  Certification:: I certify this patient will need inpatient services for at least 2 midnights  Expected Medical Readiness: 04/08/2023          B Medical/Surgery History Past Medical History:  Diagnosis Date   Epistaxis    Glaucoma    High cholesterol    Neuropathy    Past Surgical History:  Procedure Laterality Date   BACK SURGERY     CATARACT EXTRACTION Bilateral 2007   COLONOSCOPY  11/04/2007   Dr. Cleotis Nipper, normal exam.  Recommended repeat colonoscopy in 10 years.   COLONOSCOPY WITH PROPOFOL N/A 02/23/2020   Procedure: COLONOSCOPY WITH PROPOFOL;  Surgeon: Lanelle Bal, DO;  [facility];  Service: Endoscopy;  Laterality: N/A;  7:30am   NASAL ENDOSCOPY WITH EPISTAXIS CONTROL Right 10/11/2017   Procedure: NASAL CAUTERY AND  BIOPSY OF RIGHT NASAL GRANULATION TISSUE;  Surgeon: Newman Pies, MD;  Location: [facility];  Service: ENT;  Laterality: Right;   NASAL ENDOSCOPY WITH  EPISTAXIS CONTROL Right 10/22/2017   Procedure: NASAL ENDOSCOPY WITH RIGHT EPISTAXIS CONTROL;  Surgeon: Newman Pies, MD;  Location: [facility];  Service: ENT;  Laterality: Right;   POLYPECTOMY  02/23/2020   Procedure: POLYPECTOMY;  Surgeon: Lanelle Bal, DO;  [facility];  Service: Endoscopy;;   TONSILLECTOMY  1950     A IV Location/Drains/Wounds Patient Lines/Drains/Airways Status     Active Line/Drains/Airways     Name Placement date Placement time Site Days   Peripheral IV 04/05/23 20 G Anterior;Left Forearm 04/05/23  1822  Forearm  less than 1            Intake/Output Last 24 hours No intake or output data in the 24 hours ending 04/05/23 1936  Labs/Imaging Results for orders placed or performed during the hospital encounter of 04/05/23 (from the past 48 hour(s))  Basic metabolic panel     Status: Abnormal   Collection Time: 04/05/23  6:21 PM  Result Value Ref Range   Sodium 131 (L) 135 - 145 mmol/L   Potassium 4.1 3.5 - 5.1 mmol/L   Chloride 102 98 - 111 mmol/L   CO2 20 (L) 22 - 32 mmol/L   Glucose, Bld 126 (H) 70 - 99 mg/dL    Comment: Glucose reference range applies only to samples taken after fasting for at least 8 hours.   BUN 21 8 - 23 mg/dL   Creatinine, Ser 6.57 0.61 - 1.24 mg/dL   Calcium 9.1 8.9 - 84.6 mg/dL  GFR, Estimated >60 >60 mL/min    Comment: (NOTE) Calculated using the CKD-EPI Creatinine Equation (2021)    Anion gap 9 5 - 15    Comment: Performed at [facility], [address] [place], [place] [address]  CBC with Differential     Status: None   Collection Time: 04/05/23  6:21 PM  Result Value Ref Range   WBC 8.8 4.0 - 10.5 K/uL   RBC 4.83 4.22 - 5.81 MIL/uL   Hemoglobin 14.3 13.0 - 17.0 g/dL   HCT 43.3 29.5 - 18.8 %   MCV 84.5 80.0 - 100.0 fL   MCH 29.6 26.0 - 34.0 pg   MCHC 35.0 30.0 - 36.0 g/dL   RDW 41.6 60.6 - 30.1 %   Platelets 182 150 - 400 K/uL   nRBC 0.0 0.0 - 0.2 %   Neutrophils Relative % 82 %    Neutro Abs 7.2 1.7 - 7.7 K/uL   Lymphocytes Relative 12 %   Lymphs Abs 1.0 0.7 - 4.0 K/uL   Monocytes Relative 4 %   Monocytes Absolute 0.4 0.1 - 1.0 K/uL   Eosinophils Relative 1 %   Eosinophils Absolute 0.1 0.0 - 0.5 K/uL   Basophils Relative 1 %   Basophils Absolute 0.0 0.0 - 0.1 K/uL   Immature Granulocytes 0 %   Abs Immature Granulocytes 0.03 0.00 - 0.07 K/uL    Comment: Performed at [facility], [address] [place], [place] [address]  Lactic acid, plasma     Status: None   Collection Time: 04/05/23  6:21 PM  Result Value Ref Range   Lactic Acid, Venous 1.3 0.5 - 1.9 mmol/L    Comment: Performed at [facility], [address] [place], [place] [address]  Culture, blood (routine x 2)     Status: None (Preliminary result)   Collection Time: 04/05/23  6:21 PM   Specimen: BLOOD  Result Value Ref Range   Specimen Description BLOOD BLOOD LEFT FOREARM    Special Requests      BOTTLES DRAWN AEROBIC AND ANAEROBIC Blood Culture adequate volume Performed at [facility], [address] [place], [place] [address]    Culture PENDING    Report Status PENDING   Culture, blood (routine x 2)     Status: None (Preliminary result)   Collection Time: 04/05/23  6:23 PM   Specimen: BLOOD  Result Value Ref Range   Specimen Description BLOOD RIGHT ANTECUBITAL    Special Requests      BOTTLES DRAWN AEROBIC AND ANAEROBIC Blood Culture adequate volume Performed at [facility], [address], [place], [place] [address]    Culture PENDING    Report Status PENDING    No results found.  Pending Labs Unresulted Labs (From admission, onward)     Start     Ordered   04/05/23 1750  Lactic acid, plasma  Now then every 2 hours,   R (with STAT occurrences)      04/05/23 1749            Vitals/Pain Today's Vitals   04/05/23 1711 04/05/23 1713 04/05/23 1900 04/05/23 1905  BP:  (!) 179/69 (!) 153/60   Pulse:  75 (!) 50   Resp:  (!) 22 10   Temp:  97.8 F (36.6 C)    TempSrc:  Oral     SpO2:  100% 97%   Weight: 87 kg     Height: 6' (1.829 m)     PainSc: 0-No pain  0-No pain    Isolation Precautions No active isolations  Medications Medications  vancomycin (VANCOREADY) IVPB 2000 mg/400 mL (2,000 mg Intravenous New Bag/Given 04/05/23 1857)  vancomycin (VANCOCIN) IVPB 1000 mg/200 mL premix (has no administration in time range)  sodium chloride 0.9 % bolus 1,000 mL (0 mLs Intravenous Stopped 04/05/23 1928)  ondansetron (ZOFRAN) injection 4 mg (4 mg Intravenous Given 04/05/23 1822)    Mobility walks with device     Focused Assessments    R Recommendations: See Admitting Provider Note  Report given to:   Additional Notes: A&O; Continent with urinal; 20G LAC; Ambu with cane at baseline

## 2023-04-05 NOTE — ED Triage Notes (Signed)
Pt has wound the right lower leg and is currently on antibiotics. Pt developed chills and nausea today.

## 2023-04-06 DIAGNOSIS — I739 Peripheral vascular disease, unspecified: Secondary | ICD-10-CM | POA: Diagnosis present

## 2023-04-06 DIAGNOSIS — L03115 Cellulitis of right lower limb: Secondary | ICD-10-CM | POA: Diagnosis not present

## 2023-04-06 LAB — CBC
HCT: 37 % — ABNORMAL LOW (ref 39.0–52.0)
Hemoglobin: 12.8 g/dL — ABNORMAL LOW (ref 13.0–17.0)
MCH: 29.9 pg (ref 26.0–34.0)
MCHC: 34.6 g/dL (ref 30.0–36.0)
MCV: 86.4 fL (ref 80.0–100.0)
Platelets: 152 10*3/uL (ref 150–400)
RBC: 4.28 MIL/uL (ref 4.22–5.81)
RDW: 13.6 % (ref 11.5–15.5)
WBC: 6.3 10*3/uL (ref 4.0–10.5)
nRBC: 0 % (ref 0.0–0.2)

## 2023-04-06 LAB — COMPREHENSIVE METABOLIC PANEL
ALT: 21 U/L (ref 0–44)
AST: 23 U/L (ref 15–41)
Albumin: 3.4 g/dL — ABNORMAL LOW (ref 3.5–5.0)
Alkaline Phosphatase: 57 U/L (ref 38–126)
Anion gap: 8 (ref 5–15)
BUN: 17 mg/dL (ref 8–23)
CO2: 21 mmol/L — ABNORMAL LOW (ref 22–32)
Calcium: 8.8 mg/dL — ABNORMAL LOW (ref 8.9–10.3)
Chloride: 106 mmol/L (ref 98–111)
Creatinine, Ser: 1.02 mg/dL (ref 0.61–1.24)
GFR, Estimated: >60 mL/min (ref >60)
Glucose, Bld: 88 mg/dL (ref 70–99)
Potassium: 4.1 mmol/L (ref 3.5–5.1)
Sodium: 135 mmol/L (ref 135–145)
Total Bilirubin: 0.9 mg/dL (ref <1.2)
Total Protein: 5.8 g/dL — ABNORMAL LOW (ref 6.5–8.1)

## 2023-04-06 LAB — OSMOLALITY, URINE: Osmolality, Ur: 575 mosm/kg (ref 300–900)

## 2023-04-06 LAB — OSMOLALITY: Osmolality: 296 mosm/kg — ABNORMAL HIGH (ref 275–295)

## 2023-04-06 MED ORDER — HYDROCORTISONE 1 % EX CREA
TOPICAL_CREAM | Freq: Three times a day (TID) | CUTANEOUS | Status: DC
  Administered 2023-04-06: 1 via TOPICAL
  Filled 2023-04-06: qty 28

## 2023-04-06 MED ORDER — HYDROCORTISONE 0.5 % EX CREA: TOPICAL_CREAM | Freq: Four times a day (QID) | CUTANEOUS | Status: DC | PRN

## 2023-04-06 MED ORDER — ASPIRIN 81 MG PO TBEC
81.0000 mg | DELAYED_RELEASE_TABLET | Freq: Every day | ORAL | Status: DC
  Administered 2023-04-07: 81 mg via ORAL
  Filled 2023-04-06: qty 1

## 2023-04-06 MED ORDER — HYDROCODONE-ACETAMINOPHEN 5-325 MG PO TABS
1.0000 | ORAL_TABLET | ORAL | Status: DC | PRN
  Administered 2023-04-06: 1 via ORAL
  Filled 2023-04-06: qty 1

## 2023-04-06 NOTE — TOC CM/SW Note (Signed)
Transition of Care [facility]) - Inpatient Brief Assessment   Patient Details  Name: Roberto Rosales MRN: [id number] Date of Birth: 17-Oct-1945  Transition of Care [facility] CM/SW Contact:    Armanda Heritage, RN Phone Number: 04/06/2023, 10:08 AM   Clinical Narrative: Chart reviewed, no TOC needs identified at this time. Anticipate discharge home with oral antibiotics per MD.   Transition of Care Asessment: Insurance and Status: Insurance coverage has been reviewed Patient has primary care physician: Yes Home environment has been reviewed: from home Prior level of function:: independent Prior/Current Home Services: No current home services Social Determinants of Health Reivew: SDOH reviewed no interventions necessary Readmission risk has been reviewed: Yes Transition of care needs: no transition of care needs at this time

## 2023-04-06 NOTE — Consult Note (Signed)
WOC Nurse Consult Note: Reason for Consult::LE wounds Patient followed outpatient, recent ABIs obtained. History of PAD. Wound noted in 09/2022, have progressed.  ABI RLE 0.68 LLE 1.1 Wound type: Arterial ulceration; right pretibial Arterial ulceration; possible mixed etiology right medial LE; unroofed blister Pressure Injury POA: NA Measurement: see nursing notes Wound bed: 100% non viable tissue/yellow/black; right pretibial 100% pink, moist right medial  Drainage (amount, consistency, odor) none documented Periwound: erythema  Dressing procedure/placement/frequency: Cleanse RLE wounds with Vashe Hart [place] # [phone]) Single layer of xeroform gauze over each wound, would not recommend debridement until evaluation per vascular with recent abnormal ABIs FU with VVS or other vascular MD outpatient if scheduled.   Consider vascular referral     Re consult if needed, will not follow at this time. Thanks  Chriss Mannan [employer], RN,CWOCN, CNS, CWON-AP [phone])

## 2023-04-06 NOTE — Plan of Care (Signed)

## 2023-04-06 NOTE — Progress Notes (Signed)
Redressed patient's dressing to his RLE would. Patient reported pain to the area; notified Dr. Marisa Severin.

## 2023-04-06 NOTE — Progress Notes (Addendum)
PROGRESS NOTE   Roberto Rosales, is a 77 y.o. male, DOB - 04/12/1946, OZH:[id number]  Admit date - 04/05/2023   Admitting Physician Frankey Shown, DO  Outpatient Primary MD for the patient is [facility]  LOS - 1  Chief Complaint  Patient presents with   Wound Infection       Brief Narrative:  77 y.o. male with medical history significant of peripheral neuropathy, PAD, HLD, admitted on 04/05/2023 with right leg cellulitis    -Assessment and Plan: 1)Rt Leg Cellulitis--- failed outpatient antibiotics  (was treated with doxycycline and Bactrim as outpatient-over the last week or so) -Continue IV vancomycin -No fevers or leukocytosis at this time -Wound care consult appreciated -Patient has underlying PAD which will hinder healing of his open wounds cellulitis  2)PAD--- lower extremity ultrasounds with ABI from [facility] healthcare dated 02/16/2023 showed Depressed right ankle-brachial index of 0.68 consistent with  moderate underlying peripheral arterial disease.  -Aspirin therapy advised- -continue Lipitor patient states that recent lipid panel from [facility] was within acceptable range -outpatient follow-up with vascular surgery advised -Patient denies intermittent claudication--he goes to the gym 5-6 times a week to walk and work out --without intermittent claudication  3)Glaucoma--c/n eyedrops  4) hyponatremia--- due to persistent nausea and poor oral intake -Sodium normalized with hydration  Status is: Inpatient   Disposition: The patient is from: Home              Anticipated d/c is to: Home              Anticipated d/c date is: 1 day              Patient currently is not medically stable to d/c. Barriers: Not Clinically Stable-   Code Status :  -  Code Status: Full Code   Family Communication:    NA (patient is alert, awake and coherent)   DVT Prophylaxis  :   - SCDs  enoxaparin (LOVENOX) injection 40 mg Start: 04/05/23 2000 SCDs  Start: 04/05/23 1951   Lab Results  Component Value Date   PLT 152 04/06/2023   Inpatient Medications  Scheduled Meds:  [START ON 04/07/2023] aspirin EC  81 mg Oral Q breakfast   atorvastatin  5 mg Oral QHS   dorzolamide-timolol  1 drop Left Eye BID   enoxaparin (LOVENOX) injection  40 mg Subcutaneous Q24H   latanoprost  1 drop Both Eyes QHS   Continuous Infusions:  vancomycin 200 mL/hr at 04/06/23 0656   PRN Meds:.acetaminophen **OR** acetaminophen, ondansetron **OR** ondansetron (ZOFRAN) IV, mouth rinse   Anti-infectives (From admission, onward)    Start     Dose/Rate Route Frequency Ordered Stop   04/06/23 0600  vancomycin (VANCOCIN) IVPB 1000 mg/200 mL premix        1,000 mg 200 mL/hr over 60 Minutes Intravenous Every 12 hours 04/05/23 1908     04/05/23 1815  vancomycin (VANCOREADY) IVPB 2000 mg/400 mL        2,000 mg 200 mL/hr over 120 Minutes Intravenous  Once 04/05/23 1808 04/05/23 2103      Subjective: Roberto Rosales today has no fevers, no emesis,  No chest pain,   -Right leg discomfort persist No chest pains no intermittent claudication  Objective: Vitals:   04/05/23 2025 04/05/23 2359 04/06/23 0419 04/06/23 0742  BP: (!) 164/71 (!) 134/54 (!) 141/62 (!) 149/67  Pulse: (!) 55 (!) 48 (!) 47 (!) 52  Resp: 16 18 18 12   Temp: 97.9 F (  36.6 C) 97.9 F (36.6 C) 98.6 F (37 C) 97.9 F (36.6 C)  TempSrc:    Oral  SpO2: 97% 100% 96% 97%  Weight:      Height:        Intake/Output Summary (Last 24 hours) at 04/06/2023 1127 Last data filed at 04/06/2023 0900 Gross per 24 hour  Intake 1028.25 ml  Output --  Net 1028.25 ml   Filed Weights   04/05/23 1711  Weight: 87 kg   Physical Exam Gen:- Awake Alert,  in no apparent distress  HEENT:- [place].AT, No sclera icterus Neck-Supple Neck,No JVD,.  Lungs-  CTAB , fair symmetrical air movement CV- S1, S2 normal, regular  Abd-  +ve B.Sounds, Abd Soft, No tenderness,    Psych-affect is appropriate, oriented  x3 Neuro-no new focal deficits, no tremors Extremities---pedal pulses present bilaterally -  Media Information  Document Information  Photos  Rt Leg  04/06/2023 09:11  Attached To:  Hospital Encounter on 04/05/23  Source Information  Shon Hale, MD  [facility]  Document History     Data Reviewed: I have personally reviewed following labs and imaging studies  CBC: Recent Labs  Lab 04/05/23 1821 04/06/23 0425  WBC 8.8 6.3  NEUTROABS 7.2  --   HGB 14.3 12.8*  HCT 40.8 37.0*  MCV 84.5 86.4  PLT 182 152   Basic Metabolic Panel: Recent Labs  Lab 04/05/23 1821 04/06/23 0425  NA 131* 135  K 4.1 4.1  CL 102 106  CO2 20* 21*  GLUCOSE 126* 88  BUN 21 17  CREATININE 0.96 1.02  CALCIUM 9.1 8.8*   GFR: Estimated Creatinine Clearance: 66.6 mL/min (by C-G formula based on SCr of 1.02 mg/dL). Liver Function Tests: Recent Labs  Lab 04/06/23 0425  AST 23  ALT 21  ALKPHOS 57  BILITOT 0.9  PROT 5.8*  ALBUMIN 3.4*   Recent Results (from the past 240 hour(s))  Culture, blood (routine x 2)     Status: None (Preliminary result)   Collection Time: 04/05/23  6:21 PM   Specimen: BLOOD  Result Value Ref Range Status   Specimen Description BLOOD BLOOD LEFT FOREARM  Final   Special Requests   Final    BOTTLES DRAWN AEROBIC AND ANAEROBIC Blood Culture adequate volume   Culture   Final    NO GROWTH < 12 HOURS Performed at [facility], [address] [place], [place] [address]    Report Status PENDING  Incomplete  Culture, blood (routine x 2)     Status: None (Preliminary result)   Collection Time: 04/05/23  6:23 PM   Specimen: BLOOD  Result Value Ref Range Status   Specimen Description BLOOD RIGHT ANTECUBITAL  Final   Special Requests   Final    BOTTLES DRAWN AEROBIC AND ANAEROBIC Blood Culture adequate volume   Culture   Final    NO GROWTH < 12 HOURS Performed at [facility], [address] [place], [place] [address]    Report Status PENDING  Incomplete     Scheduled Meds:  [START ON 04/07/2023] aspirin EC  81 mg Oral Q breakfast   atorvastatin  5 mg Oral QHS   dorzolamide-timolol  1 drop Left Eye BID   enoxaparin (LOVENOX) injection  40 mg Subcutaneous Q24H   latanoprost  1 drop Both Eyes QHS   Continuous Infusions:  vancomycin 200 mL/hr at 04/06/23 0656    LOS: 1 day   Shon Hale M.D on 04/06/2023 at 11:27 AM  Go  to [email or web address] - for contact info  [facility] - Office  [phone]  If 7PM-7AM, please contact night-coverage [email or web address] 04/06/2023, 11:27 AM

## 2023-04-07 DIAGNOSIS — L03115 Cellulitis of right lower limb: Secondary | ICD-10-CM | POA: Diagnosis not present

## 2023-04-07 LAB — CREATININE, SERUM
Creatinine, Ser: 1.17 mg/dL (ref 0.61–1.24)
GFR, Estimated: >60 mL/min (ref >60)

## 2023-04-07 MED ORDER — HYDROCORTISONE 1 % EX CREA: TOPICAL_CREAM | Freq: Two times a day (BID) | CUTANEOUS | 0 refills | Status: DC | PRN

## 2023-04-07 MED ORDER — TRAMADOL HCL 50 MG PO TABS: 50.0000 mg | ORAL_TABLET | Freq: Two times a day (BID) | ORAL | 0 refills | Status: DC | PRN

## 2023-04-07 MED ORDER — DOXYCYCLINE HYCLATE 100 MG PO TABS: 100.0000 mg | ORAL_TABLET | Freq: Two times a day (BID) | ORAL | 0 refills | Status: AC

## 2023-04-07 MED ORDER — CEPHALEXIN 500 MG PO CAPS: 500.0000 mg | ORAL_CAPSULE | Freq: Three times a day (TID) | ORAL | 0 refills | Status: AC

## 2023-04-07 MED ORDER — ACETAMINOPHEN 325 MG PO TABS: 650.0000 mg | ORAL_TABLET | Freq: Four times a day (QID) | ORAL | Status: AC | PRN

## 2023-04-07 MED ORDER — ASPIRIN 81 MG PO TBEC: 81.0000 mg | DELAYED_RELEASE_TABLET | Freq: Every day | ORAL | 3 refills | Status: AC

## 2023-04-07 NOTE — Discharge Instructions (Signed)
1)Avoid ibuprofen/Advil/Aleve/Motrin/Goody Powders/Naproxen/BC powders/Meloxicam/Diclofenac/Indomethacin and other Nonsteroidal anti-inflammatory medications as these will make you more likely to bleed and can cause stomach ulcers, can also cause Kidney problems.   2)Please follow-up with vascular surgeon Dr. Tawanna Cooler Early at  [address], [place], [place] [address], Phone: [phone]--- to discuss peripheral artery disease concerns with poor circulation (Lower Extremity Arterial Ultrasound with ABI from [facility] healthcare dated 02/16/2023 showed Depressed right ankle-brachial index of 0.68 consistent with moderate underlying peripheral arterial disease)  3) please note that there are a few changes to your current medications  4)Cleanse RLE wounds with saline solution, apply Single layer of xeroform gauze (petroleum non-stick gauze) over each wound

## 2023-04-07 NOTE — Discharge Summary (Signed)
Roberto Rosales, is a 77 y.o. male  DOB 1945/12/09  MRN [id number].  Admission date:  04/05/2023  Admitting Physician  Frankey Shown, DO  Discharge Date:  04/07/2023   Primary MD  [facility]  Recommendations for primary care physician for things to follow:  1)Avoid ibuprofen/Advil/Aleve/Motrin/Goody Powders/Naproxen/BC powders/Meloxicam/Diclofenac/Indomethacin and other Nonsteroidal anti-inflammatory medications as these will make you more likely to bleed and can cause stomach ulcers, can also cause Kidney problems.   2)Please follow-up with vascular surgeon Dr. Tawanna Cooler Early at  [address] [place], [place] [address], Phone: [phone]--- to discuss peripheral artery disease concerns with poor circulation (Lower Extremity Arterial Ultrasound with ABI from [facility] healthcare dated 02/16/2023 showed Depressed right ankle-brachial index of 0.68 consistent with moderate underlying peripheral arterial disease)  3) please note that there are a few changes to your current medications  4)Cleanse RLE wounds with saline solution, apply Single layer of xeroform gauze (petroleum non-stick gauze) over each wound  Admission Diagnosis  Cellulitis of right lower extremity [L03.115]  Discharge Diagnosis  Cellulitis of right lower extremity [L03.115]   Principal Problem:   Cellulitis of right lower extremity Active Problems:   Open wound of right lower extremity/Cellulitis   Mixed hyperlipidemia   PAD--Rt Leg ABI 0.68   Glaucoma   Nausea   Hyponatremia      Past Medical History:  Diagnosis Date   Epistaxis    Glaucoma    High cholesterol    Neuropathy     Past Surgical History:  Procedure Laterality Date   BACK SURGERY     CATARACT EXTRACTION Bilateral 2007   COLONOSCOPY  11/04/2007   Dr. Cleotis Nipper, normal exam.  Recommended repeat colonoscopy in 10 years.   COLONOSCOPY WITH PROPOFOL N/A  02/23/2020   Procedure: COLONOSCOPY WITH PROPOFOL;  Surgeon: Lanelle Bal, DO;  [facility];  Service: Endoscopy;  Laterality: N/A;  7:30am   NASAL ENDOSCOPY WITH EPISTAXIS CONTROL Right 10/11/2017   Procedure: NASAL CAUTERY AND  BIOPSY OF RIGHT NASAL GRANULATION TISSUE;  Surgeon: Newman Pies, MD;  Location: [facility] OR;  Service: ENT;  Laterality: Right;   NASAL ENDOSCOPY WITH EPISTAXIS CONTROL Right 10/22/2017   Procedure: NASAL ENDOSCOPY WITH RIGHT EPISTAXIS CONTROL;  Surgeon: Newman Pies, MD;  Location: [facility];  Service: ENT;  Laterality: Right;   POLYPECTOMY  02/23/2020   Procedure: POLYPECTOMY;  Surgeon: Lanelle Bal, DO;  Location: [facility];  Service: Endoscopy;;   TONSILLECTOMY  1950     HPI  from the history and physical done on the day of admission:    HPI: Roberto Rosales is a 77 y.o. male with medical history significant of peripheral neuropathy, hyperlipidemia who presents to the emergency department due to worsening right lower extremity wound infection.  He complained of a blister on his right lower leg which became itchy on Saturday (11/23), he scratched it slightly, but it bursts releasing a yellowish purulent discharge.  He went to his PCP on Monday (11/25) and was started  on doxycycline, this was switched to Bactrim yesterday (9/27).  He complained of nausea and shaking chills at home within the last 24 hours with a superimposed erythema around the wound on the leg.  Patient decided to go to the ED for further evaluation and management.  He denies fever, chest pain, shortness of breath, headache.   ED Course:  In the emergency department, BP was 179/69, respiratory rate was 22/min, but other vital signs were within normal range.  Workup in the ED showed normal CBC and BMP except for sodium of 131, bicarb of 20, blood glucose 126.  Lactic acid was normal at 1.3.  Blood culture pending. Patient was treated with IV Zofran 4 mg x 1, he was started on IV  vancomycin.  IV NS 1 L was provided. Hospitalist was asked to admit patient for further evaluation and management.   Review of Systems: Review of systems as noted in the HPI. All other systems reviewed and are negative.    Hospital Course:     Brief Narrative:  77 y.o. male with medical history significant of peripheral neuropathy, PAD, HLD, admitted on 04/05/2023 with right leg cellulitis     -Assessment and Plan: 1)Rt Leg Cellulitis--- failed outpatient antibiotics  (was treated with doxycycline and Bactrim as outpatient-over the last week or so) -Received IV vancomycin -No fevers or leukocytosis at this time -Wound care consult appreciated -Patient has underlying PAD which will hinder healing of his open wounds cellulitis -Patient's right leg wounds are most likely related to his underlying PAD -Aspirin and Lipitor advised -Outpatient follow-up with vascular surgeon advised -Discharge on Doxy/Keflex combo, however antibiotics alone probably will not suffice -Underlying vascular insufficiency needs to be addressed   2)PAD--- lower extremity ultrasounds with ABI from [facility] healthcare dated 02/16/2023 showed Depressed right ankle-brachial index of 0.68 consistent with  moderate underlying peripheral arterial disease.  -Aspirin therapy advised- -continue Lipitor patient states that recent lipid panel from [facility] was within acceptable range -outpatient follow-up with vascular surgery advised -Patient denies intermittent claudication--he goes to the gym 5-6 times a week to walk and work out --without intermittent claudication   3)Glaucoma--c/n eyedrops   4) hyponatremia--- due to persistent nausea and poor oral intake -Sodium normalized with hydration  5) acute anemia--hemoglobin down to 12.8 from a baseline usually between 13 and 14 , suspect this is due to hemodilution from IV fluids -No bleeding concerns   Disposition: The patient is from: Home               Anticipated d/c is to: Home  Discharge Condition: stable  Follow UP   Follow-up Information     Early, Kristen Loader, MD. Schedule an appointment as soon as possible for a visit.   Specialties: Vascular Surgery, Cardiology Why: PAD---Depressed right ankle-brachial index of 0.68 consistent with  moderate underlying peripheral arterial disease.              Diet and Activity recommendation:  As advised  Discharge Instructions    Discharge Instructions     Call MD for:  difficulty breathing, headache or visual disturbances   Complete by: As directed    Call MD for:  persistant dizziness or light-headedness   Complete by: As directed    Call MD for:  persistant nausea and vomiting   Complete by: As directed    Call MD for:  temperature >100.4   Complete by: As directed    Diet - low sodium heart healthy  Complete by: As directed    Discharge instructions   Complete by: As directed    1)Avoid ibuprofen/Advil/Aleve/Motrin/Goody Powders/Naproxen/BC powders/Meloxicam/Diclofenac/Indomethacin and other Nonsteroidal anti-inflammatory medications as these will make you more likely to bleed and can cause stomach ulcers, can also cause Kidney problems.   2)Please follow-up with vascular surgeon Dr. Tawanna Cooler Early at  [address], [place], [place] [address], Phone: [phone]--- to discuss peripheral artery disease concerns with poor circulation (Lower Extremity Arterial Ultrasound with ABI from [facility] healthcare dated 02/16/2023 showed Depressed right ankle-brachial index of 0.68 consistent with moderate underlying peripheral arterial disease)  3) please note that there are a few changes to your current medications  4)Cleanse RLE wounds with saline solution, apply Single layer of xeroform gauze (petroleum non-stick gauze) over each wound   Discharge wound care:   Complete by: As directed    Cleanse RLE wounds with saline solution, apply Single layer of xeroform gauze (petroleum non-stick  gauze) over each wound   Increase activity slowly   Complete by: As directed        Discharge Medications     Allergies as of 04/07/2023       Reactions   Codeine Nausea And Vomiting        Medication List     STOP taking these medications    naproxen sodium 220 MG tablet Commonly known as: ALEVE   sulfamethoxazole-trimethoprim 800-160 MG tablet Commonly known as: BACTRIM DS       TAKE these medications    acetaminophen 325 MG tablet Commonly known as: TYLENOL Take 2 tablets (650 mg total) by mouth every 6 (six) hours as needed for mild pain (pain score 1-3) (or Fever >/= 101). What changed:  medication strength how much to take reasons to take this   aspirin EC 81 MG tablet Take 1 tablet (81 mg total) by mouth daily with breakfast. Swallow whole. Start taking on: April 08, 2023   atorvastatin 10 MG tablet Commonly known as: LIPITOR Take 5 mg by mouth at bedtime.   cephALEXin 500 MG capsule Commonly known as: Keflex Take 1 capsule (500 mg total) by mouth 3 (three) times daily for 5 days.   dorzolamide-timolol 2-0.5 % ophthalmic solution Commonly known as: COSOPT Place 1 drop into the left eye 2 (two) times daily.   doxycycline 100 MG tablet Commonly known as: VIBRA-TABS Take 1 tablet (100 mg total) by mouth 2 (two) times daily for 5 days.   hydrocortisone cream 1 % Apply topically 2 (two) times daily as needed for itching. Apply Sparingly   latanoprost 0.005 % ophthalmic solution Commonly known as: XALATAN Place 1 drop into both eyes at bedtime.   multivitamin with minerals Tabs tablet Take 1 tablet by mouth daily.   traMADol 50 MG tablet Commonly known as: ULTRAM Take 1 tablet (50 mg total) by mouth every 12 (twelve) hours as needed. What changed: when to take this   triamcinolone cream 0.1 % Commonly known as: KENALOG Apply 1 Application topically 3 (three) times daily.               Discharge Care Instructions  (From  admission, onward)           Start     Ordered   04/07/23 0000  Discharge wound care:       Comments: Cleanse RLE wounds with saline solution, apply Single layer of xeroform gauze (petroleum non-stick gauze) over each wound   04/07/23 1008  Major procedures and Radiology Reports - PLEASE review detailed and final reports for all details, in brief -   Micro Results   Recent Results (from the past 240 hour(s))  Culture, blood (routine x 2)     Status: None (Preliminary result)   Collection Time: 04/05/23  6:21 PM   Specimen: BLOOD  Result Value Ref Range Status   Specimen Description BLOOD BLOOD LEFT FOREARM  Final   Special Requests   Final    BOTTLES DRAWN AEROBIC AND ANAEROBIC Blood Culture adequate volume   Culture   Final    NO GROWTH 2 DAYS Performed at [facility], [address] [place], [place] [address]    Report Status PENDING  Incomplete  Culture, blood (routine x 2)     Status: None (Preliminary result)   Collection Time: 04/05/23  6:23 PM   Specimen: BLOOD  Result Value Ref Range Status   Specimen Description BLOOD RIGHT ANTECUBITAL  Final   Special Requests   Final    BOTTLES DRAWN AEROBIC AND ANAEROBIC Blood Culture adequate volume   Culture   Final    NO GROWTH 2 DAYS Performed at [facility], [address] [place], [place] [address]    Report Status PENDING  Incomplete   Today   Subjective    Roberto Rosales today has no new complaints  No fever  Or chills   No Nausea, Vomiting or Diarrhea        Patient has been seen and examined prior to discharge   Objective   Blood pressure 128/66, pulse (!) 50, temperature 98.6 F (37 C), resp. rate 18, height 6' (1.829 m), weight 87 kg, SpO2 94%.   Intake/Output Summary (Last 24 hours) at 04/07/2023 1010 Last data filed at 04/07/2023 0800 Gross per 24 hour  Intake 1067.94 ml  Output --  Net 1067.94 ml   Exam Gen:- Awake Alert, no acute distress  HEENT:- [place].AT, No sclera  icterus Neck-Supple Neck,No JVD,.  Lungs-  CTAB , good air movement bilaterally CV- S1, S2 normal, regular Abd-  +ve B.Sounds, Abd Soft, No tenderness,    Psych-affect is appropriate, oriented x3 Neuro-no new focal deficits, no tremors  Extremity/Skin:- +ve  pulses, --improving right leg erythema, warmth swelling and tenderness, 2 open areas appeared hemostatic without significant drainage--please see photos in epic   Data Review   CBC w Diff:  Lab Results  Component Value Date   WBC 6.3 04/06/2023   HGB 12.8 (L) 04/06/2023   HCT 37.0 (L) 04/06/2023   PLT 152 04/06/2023   LYMPHOPCT 12 04/05/2023   MONOPCT 4 04/05/2023   EOSPCT 1 04/05/2023   BASOPCT 1 04/05/2023    CMP:  Lab Results  Component Value Date   NA 135 04/06/2023   K 4.1 04/06/2023   CL 106 04/06/2023   CO2 21 (L) 04/06/2023   BUN 17 04/06/2023   CREATININE 1.17 04/07/2023   PROT 5.8 (L) 04/06/2023   PROT 6.5 02/22/2017   ALBUMIN 3.4 (L) 04/06/2023   BILITOT 0.9 04/06/2023   ALKPHOS 57 04/06/2023   AST 23 04/06/2023   ALT 21 04/06/2023  .  Total Discharge time is about 33 minutes  Shon Hale M.D on 04/07/2023 at 10:10 AM  Go to [email or web address] -  for contact info  [facility] - Office  [phone]

## 2023-04-07 NOTE — Plan of Care (Signed)

## 2023-04-10 LAB — CULTURE, BLOOD (ROUTINE X 2)
Culture: NO GROWTH
Culture: NO GROWTH
Special Requests: ADEQUATE
Special Requests: ADEQUATE

## 2023-04-19 DIAGNOSIS — R238 Other skin changes: Secondary | ICD-10-CM | POA: Diagnosis not present

## 2023-04-19 DIAGNOSIS — L03115 Cellulitis of right lower limb: Secondary | ICD-10-CM | POA: Diagnosis not present

## 2023-04-19 DIAGNOSIS — R609 Edema, unspecified: Secondary | ICD-10-CM | POA: Diagnosis not present

## 2023-04-19 DIAGNOSIS — Z6827 Body mass index (BMI) 27.0-27.9, adult: Secondary | ICD-10-CM | POA: Diagnosis not present

## 2023-04-19 DIAGNOSIS — R030 Elevated blood-pressure reading, without diagnosis of hypertension: Secondary | ICD-10-CM | POA: Diagnosis not present

## 2023-04-23 NOTE — Progress Notes (Signed)
[facility] OF [place]  ASSESSMENT / PLAN: Roberto Rosales is a 77 y.o. male with venous ulceration of the right ankle.  He has asymptomatic peripheral arterial disease by noninvasive testing.  This testing is discordant with my physical exam today.  Will plan to initiate Unna boot therapy today.  Will see him back in 1 week.  Will check an ABI at that time.  CHIEF COMPLAINT: Right ankle ulceration  HISTORY OF PRESENT ILLNESS: Roberto Rosales is a 77 y.o. male referred to clinic for evaluation of right ankle ulceration.  He has a history of chronic wounds in the bilateral ankles and areas typical venous ulceration above the malleoli.  His significant swelling in his bilateral lower extremities with venous stasis dermatitis and reticular veins.  He has neuropathy about his feet and does not have a sensation about his feet.  Past Medical History:  Diagnosis Date   Epistaxis    Glaucoma    High cholesterol    Neuropathy     Past Surgical History:  Procedure Laterality Date   BACK SURGERY     CATARACT EXTRACTION Bilateral 2007   COLONOSCOPY  11/04/2007   Dr. Cleotis Nipper, normal exam.  Recommended repeat colonoscopy in 10 years.   COLONOSCOPY WITH PROPOFOL N/A 02/23/2020   Procedure: COLONOSCOPY WITH PROPOFOL;  Surgeon: Lanelle Bal, DO;  [facility];  Service: Endoscopy;  Laterality: N/A;  7:30am   NASAL ENDOSCOPY WITH EPISTAXIS CONTROL Right 10/11/2017   Procedure: NASAL CAUTERY AND  BIOPSY OF RIGHT NASAL GRANULATION TISSUE;  Surgeon: Newman Pies, MD;  Location: [facility];  Service: ENT;  Laterality: Right;   NASAL ENDOSCOPY WITH EPISTAXIS CONTROL Right 10/22/2017   Procedure: NASAL ENDOSCOPY WITH RIGHT EPISTAXIS CONTROL;  Surgeon: Newman Pies, MD;  Location: [facility];  Service: ENT;  Laterality: Right;   POLYPECTOMY  02/23/2020   Procedure: POLYPECTOMY;  Surgeon: Lanelle Bal, DO;  [facility]: [facility];  Service: Endoscopy;;   TONSILLECTOMY   1950    Family History  Problem Relation Age of Onset   Cerebral aneurysm Mother    Heart disease Father    Stroke Father    Neuropathy Neg Hx    Colon cancer Neg Hx     Social History   Socioeconomic History   Marital status: Married    Spouse name: Not on file   Number of children: 2   Years of education: 14   Highest education level: Not on file  Occupational History   Occupation: Retired  Tobacco Use   Smoking status: Never   Smokeless tobacco: Never  Vaping Use   Vaping status: Never Used  Substance and Sexual Activity   Alcohol use: No   Drug use: No   Sexual activity: Not Currently    Partners: Female  Other Topics Concern   Not on file  Social History Narrative   Lives at home w/ his wife   Right-handed   Caffeine: 1 - 1 1/2 cups coffee per day   Social Drivers of Health   Financial Resource Strain: Not on file  Food Insecurity: No Food Insecurity (04/05/2023)   Hunger Vital Sign    Worried About Running Out of Food in the Last Year: Never true    Ran Out of Food in the Last Year: Never true  Transportation Needs: No Transportation Needs (04/05/2023)   PRAPARE - Administrator, Civil Service (Medical): No    Lack of Transportation (  Non-Medical): No  Physical Activity: Not on file  Stress: Not on file  Social Connections: Not on file  Intimate Partner Violence: Not At Risk (04/05/2023)   Humiliation, Afraid, Rape, and Kick questionnaire    Fear of Current or Ex-Partner: No    Emotionally Abused: No    Physically Abused: No    Sexually Abused: No    Allergies  Allergen Reactions   Codeine Nausea And Vomiting    Current Outpatient Medications  Medication Sig Dispense Refill   acetaminophen (TYLENOL) 325 MG tablet Take 2 tablets (650 mg total) by mouth every 6 (six) hours as needed for mild pain (pain score 1-3) (or Fever >/= 101).     aspirin EC 81 MG tablet Take 1 tablet (81 mg total) by mouth daily with breakfast. Swallow whole.  100 tablet 3   atorvastatin (LIPITOR) 10 MG tablet Take 5 mg by mouth at bedtime.      clindamycin (CLEOCIN) 300 MG capsule Take 300 mg by mouth 3 (three) times daily.     dorzolamide-timolol (COSOPT) 22.3-6.8 MG/ML ophthalmic solution Place 1 drop into the left eye 2 (two) times daily.     furosemide (LASIX) 20 MG tablet Take 20 mg by mouth daily.     hydrocortisone cream 1 % Apply topically 2 (two) times daily as needed for itching. Apply Sparingly 30 g 0   latanoprost (XALATAN) 0.005 % ophthalmic solution Place 1 drop into both eyes at bedtime.      Multiple Vitamin (MULTIVITAMIN WITH MINERALS) TABS tablet Take 1 tablet by mouth daily.     traMADol (ULTRAM) 50 MG tablet Take 1 tablet (50 mg total) by mouth every 12 (twelve) hours as needed. 15 tablet 0   triamcinolone cream (KENALOG) 0.1 % Apply 1 Application topically 3 (three) times daily.     No current facility-administered medications for this visit.    PHYSICAL EXAM Vitals:   04/24/23 0921  BP: (!) 168/76  Pulse: (!) 59  Resp: (!) 98  Weight: 193 lb 6.4 oz (87.7 kg)  Height: 6' (1.829 m)    Elderly man in no distress Regular rate and rhythm Unlabored breathing Palpable right dorsalis pedis pulse, expected given ABI findings    PERTINENT LABORATORY AND RADIOLOGIC DATA  Most recent CBC    Latest Ref Rng & Units 04/06/2023    4:25 AM 04/05/2023    6:21 PM 11/01/2019    5:15 AM  CBC  WBC 4.0 - 10.5 K/uL 6.3  8.8  9.2   Hemoglobin 13.0 - 17.0 g/dL 31.5  17.6  16.0   Hematocrit 39.0 - 52.0 % 37.0  40.8  42.3   Platelets 150 - 400 K/uL 152  182  158      Most recent CMP    Latest Ref Rng & Units 04/07/2023    4:54 AM 04/06/2023    4:25 AM 04/05/2023    6:21 PM  CMP  Glucose 70 - 99 mg/dL  88  737   BUN 8 - 23 mg/dL  17  21   Creatinine 1.06 - 1.24 mg/dL 2.69  4.85  4.62   Sodium 135 - 145 mmol/L  135  131   Potassium 3.5 - 5.1 mmol/L  4.1  4.1   Chloride 98 - 111 mmol/L  106  102   CO2 22 - 32 mmol/L  21   20   Calcium 8.9 - 10.3 mg/dL  8.8  9.1   Total Protein 6.5 - 8.1 g/dL  5.8    Total Bilirubin <1.2 mg/dL  0.9    Alkaline Phos 38 - 126 U/L  57    AST 15 - 41 U/L  23    ALT 0 - 44 U/L  21      Renal function Estimated Creatinine Clearance: 58 mL/min (by C-G formula based on SCr of 1.17 mg/dL).  Hgb A1c MFr Bld (%)  Date Value  02/22/2017 5.4   CLINICAL DATA:  Peripheral arterial disease   EXAM:  NONINVASIVE PHYSIOLOGIC VASCULAR STUDY OF BILATERAL LOWER  EXTREMITIES   TECHNIQUE:  Evaluation of both lower extremities were performed at rest,  including calculation of ankle-brachial indices with single level  Doppler, pressure and pulse volume recording.   COMPARISON:  None Available.   FINDINGS:  Right ABI:  0.68   Left ABI:  1.1   Right Lower Extremity:  Blunted biphasic arterial waveforms.   Left Lower Extremity:  Normal arterial waveforms at the ankle.   0.5-0.79 Moderate PAD   IMPRESSION:  1. Depressed right ankle-brachial index of 0.68 consistent with  moderate underlying peripheral arterial disease.  2. The resting left ankle-brachial index is normal.   Rande Brunt. Lenell Antu, MD FACS [facility] Phone Number: [phone] 04/24/2023 10:21 AM   Total time spent on preparing this encounter including chart review, data review, collecting history, examining the patient, coordinating care for this new patient, 60 minutes.  Portions of this report may have been transcribed using voice recognition software.  Every effort has been made to ensure accuracy; however, inadvertent computerized transcription errors may still be present.

## 2023-04-24 ENCOUNTER — Encounter: Payer: Self-pay | Admitting: Vascular Surgery

## 2023-04-24 ENCOUNTER — Ambulatory Visit: Payer: PPO | Admitting: Vascular Surgery

## 2023-04-24 VITALS — BP 168/76 | HR 59 | Resp 98 | Ht 72.0 in | Wt 193.4 lb

## 2023-04-24 DIAGNOSIS — L97319 Non-pressure chronic ulcer of right ankle with unspecified severity: Secondary | ICD-10-CM | POA: Diagnosis not present

## 2023-04-24 DIAGNOSIS — I739 Peripheral vascular disease, unspecified: Secondary | ICD-10-CM | POA: Diagnosis not present

## 2023-04-24 DIAGNOSIS — I83013 Varicose veins of right lower extremity with ulcer of ankle: Secondary | ICD-10-CM | POA: Diagnosis not present

## 2023-04-25 ENCOUNTER — Other Ambulatory Visit: Payer: Self-pay

## 2023-04-25 ENCOUNTER — Telehealth: Payer: Self-pay

## 2023-04-25 DIAGNOSIS — I83013 Varicose veins of right lower extremity with ulcer of ankle: Secondary | ICD-10-CM

## 2023-04-25 NOTE — Telephone Encounter (Signed)
Pt called requesting a return call d/t a problem.  Reviewed pt's chart, returned call for clarification, two identifiers used. Pt stated that his leg began hurting so badly from the Unna boot during the night that he had to remove it.  Instructed him to try some Zinc cream/ointment for the 2 blistered areas, cover, change daily, and use compression stockings. Pt stated that he has a hard time getting the socks on. Gave him some tips and tricks to help. Pt will try his best and keep his upcoming appt for another Unna boot. Confirmed understanding.

## 2023-04-30 ENCOUNTER — Ambulatory Visit: Payer: PPO | Admitting: Physician Assistant

## 2023-04-30 ENCOUNTER — Ambulatory Visit (HOSPITAL_COMMUNITY)
Admission: RE | Admit: 2023-04-30 | Discharge: 2023-04-30 | Disposition: A | Discharge status: Home / Self Care | Payer: PPO | Source: Ambulatory Visit | Attending: Surgery | Admitting: Surgery

## 2023-04-30 VITALS — BP 147/73 | HR 55 | Temp 97.8°F | Resp 18 | Ht 72.0 in | Wt 191.5 lb

## 2023-04-30 DIAGNOSIS — L97319 Non-pressure chronic ulcer of right ankle with unspecified severity: Secondary | ICD-10-CM | POA: Diagnosis not present

## 2023-04-30 DIAGNOSIS — I83013 Varicose veins of right lower extremity with ulcer of ankle: Secondary | ICD-10-CM | POA: Diagnosis not present

## 2023-04-30 NOTE — Progress Notes (Signed)
VASC[facility]IN SPECIALISTS           OF [place]  History and Physical   Roberto Rosales is a 77 y.o. male who was recently seen by Dr. Lenell Antu for evaluation of right ankle ulceration.  He has a history of chronic wounds in the bilateral ankles and areas typical venous ulceration above the malleoli. His significant swelling in his bilateral lower extremities with venous stasis dermatitis and reticular veins. He has neuropathy in his feet and does not have sensation in his feet.  He was found to have a depressed ABI on the right of 0.68 and left was 1.1.  He was placed in an unna boot and was brought back for a venous reflux study.  He called the next day and stated he had to take the unna boot off due to pain.  He was encouraged to wear his compression socks.   He comes in today and states that he went to bed that evening with the unna boot and by 3am, he woke up and felt severe pain like a stabbing sensation and he took it off.  He states he elevates his legs on the recliner with a pillow underneath them.  He states if the wound does not stay moist, it becomes painful.  He states he has been putting some moisturizer on his wound.   He states the wound is improving.  He continues to have swelling in both legs.  He does have skin color changes.  The reticular veins have never bled.    The pt is on a statin for cholesterol management.  The pt is on a daily aspirin.   Other AC:  none The pt is on diuretic  The pt is not on medication for diabetes.   Tobacco hx:  never   Past Medical History:  Diagnosis Date   Epistaxis    Glaucoma    High cholesterol    Neuropathy     Past Surgical History:  Procedure Laterality Date   BACK SURGERY     CATARACT EXTRACTION Bilateral 2007   COLONOSCOPY  11/04/2007   Dr. Cleotis Nipper, normal exam.  Recommended repeat colonoscopy in 10 years.   COLONOSCOPY WITH PROPOFOL N/A 02/23/2020   Procedure: COLONOSCOPY WITH PROPOFOL;  Surgeon: Lanelle Bal, DO;  [facility];  Service: Endoscopy;  Laterality: N/A;  7:30am   NASAL ENDOSCOPY WITH EPISTAXIS CONTROL Right 10/11/2017   Procedure: NASAL CAUTERY AND  BIOPSY OF RIGHT NASAL GRANULATION TISSUE;  Surgeon: Newman Pies, MD;  Location: [facility];  Service: ENT;  Laterality: Right;   NASAL ENDOSCOPY WITH EPISTAXIS CONTROL Right 10/22/2017   Procedure: NASAL ENDOSCOPY WITH RIGHT EPISTAXIS CONTROL;  Surgeon: Newman Pies, MD;  Location: [facility];  Service: ENT;  Laterality: Right;   POLYPECTOMY  02/23/2020   Procedure: POLYPECTOMY;  Surgeon: Lanelle Bal, DO;  [facility];  Service: Endoscopy;;   TONSILLECTOMY  1950    Social History   Socioeconomic History   Marital status: Married    Spouse name: Not on file   Number of children: 2   Years of education: 14   Highest education level: Not on file  Occupational History   Occupation: Retired  Tobacco Use   Smoking status: Never   Smokeless tobacco: Never  Vaping Use   Vaping status: Never Used  Substance and Sexual Activity   Alcohol use: No   Drug use:  No   Sexual activity: Not Currently    Partners: Female  Other Topics Concern   Not on file  Social History Narrative   Lives at home w/ his wife   Right-handed   Caffeine: 1 - 1 1/2 cups coffee per day   Social Drivers of Health   Financial Resource Strain: Not on file  Food Insecurity: No Food Insecurity (04/05/2023)   Hunger Vital Sign    Worried About Running Out of Food in the Last Year: Never true    Ran Out of Food in the Last Year: Never true  Transportation Needs: No Transportation Needs (04/05/2023)   PRAPARE - Administrator, Civil Service (Medical): No    Lack of Transportation (Non-Medical): No  Physical Activity: Not on file  Stress: Not on file  Social Connections: Not on file  Intimate Partner Violence: Not At Risk (04/05/2023)   Humiliation, Afraid, Rape, and Kick questionnaire    Fear of Current or  Ex-Partner: No    Emotionally Abused: No    Physically Abused: No    Sexually Abused: No     Family History  Problem Relation Age of Onset   Cerebral aneurysm Mother    Heart disease Father    Stroke Father    Neuropathy Neg Hx    Colon cancer Neg Hx     Current Outpatient Medications  Medication Sig Dispense Refill   acetaminophen (TYLENOL) 325 MG tablet Take 2 tablets (650 mg total) by mouth every 6 (six) hours as needed for mild pain (pain score 1-3) (or Fever >/= 101).     aspirin EC 81 MG tablet Take 1 tablet (81 mg total) by mouth daily with breakfast. Swallow whole. 100 tablet 3   atorvastatin (LIPITOR) 10 MG tablet Take 5 mg by mouth at bedtime.      clindamycin (CLEOCIN) 300 MG capsule Take 300 mg by mouth 3 (three) times daily.     dorzolamide-timolol (COSOPT) 22.3-6.8 MG/ML ophthalmic solution Place 1 drop into the left eye 2 (two) times daily.     furosemide (LASIX) 20 MG tablet Take 20 mg by mouth daily.     hydrocortisone cream 1 % Apply topically 2 (two) times daily as needed for itching. Apply Sparingly 30 g 0   latanoprost (XALATAN) 0.005 % ophthalmic solution Place 1 drop into both eyes at bedtime.      Multiple Vitamin (MULTIVITAMIN WITH MINERALS) TABS tablet Take 1 tablet by mouth daily.     traMADol (ULTRAM) 50 MG tablet Take 1 tablet (50 mg total) by mouth every 12 (twelve) hours as needed. 15 tablet 0   triamcinolone cream (KENALOG) 0.1 % Apply 1 Application topically 3 (three) times daily.     No current facility-administered medications for this visit.    Allergies  Allergen Reactions   Codeine Nausea And Vomiting    REVIEW OF SYSTEMS:   [X]  denotes positive finding, [ ]  denotes negative finding Cardiac  Comments:  Chest pain or chest pressure:    Shortness of breath upon exertion:    Short of breath when lying flat:    Irregular heart rhythm:        Vascular    Pain in calf, thigh, or hip brought on by ambulation:    Pain in feet at night  that wakes you up from your sleep:     Blood clot in your veins:    Leg swelling:  x Venous ulceration right leg  Pulmonary    Oxygen at home:    Productive cough:     Wheezing:         Neurologic    Sudden weakness in arms or legs:     Sudden numbness in arms or legs:     Sudden onset of difficulty speaking or slurred speech:    Temporary loss of vision in one eye:     Problems with dizziness:         Gastrointestinal    Blood in stool:     Vomited blood:         Genitourinary    Burning when urinating:     Blood in urine:        Psychiatric    Major depression:         Hematologic    Bleeding problems:    Problems with blood clotting too easily:        Skin    Rashes or ulcers:        Constitutional    Fever or chills:      PHYSICAL EXAMINATION:  Today's Vitals   04/30/23 1250  BP: (!) 147/73  Pulse: (!) 55  Resp: 18  Temp: 97.8 F (36.6 C)  TempSrc: Temporal  SpO2: 97%  Weight: 191 lb 8 oz (86.9 kg)  Height: 6' (1.829 m)  PainSc: 3    Body mass index is 25.97 kg/m.   General:  WDWN in NAD; vital signs documented above Gait: Not observed HENT: WNL, normocephalic Pulmonary: normal non-labored breathing without wheezing Cardiac: regular HR Skin: without rashes Vascular Exam/Pulses: Monophasic doppler flow right DP/PT/pero; multiphasic doppler flow left DP/PT/pero Extremities: venous ulceration right leg with swelling; hemosiderin staining present   Neurologic: A&O X 3;  moving all extremities equally Psychiatric:  The pt has Normal affect.   Non-Invasive Vascular Imaging:   Venous duplex on 04/30/2023: Venous Reflux Times  +--------------+---------+------+-----------+------------+--------+  RIGHT        Reflux NoRefluxReflux TimeDiameter cmsComments                          Yes                                   +--------------+---------+------+-----------+------------+--------+  FV mid        no                                               +--------------+---------+------+-----------+------------+--------+  Popliteal    no                                              +--------------+---------+------+-----------+------------+--------+  GSV at SFJ              yes    >500 ms      .445              +--------------+---------+------+-----------+------------+--------+  GSV prox thigh          yes    >500 ms      .382              +--------------+---------+------+-----------+------------+--------+  GSV mid thigh  yes    >500 ms      .342              +--------------+---------+------+-----------+------------+--------+  GSV dist thigh          yes    >500 ms      .383              +--------------+---------+------+-----------+------------+--------+  GSV at knee             yes    >500 ms      .324              +--------------+---------+------+-----------+------------+--------+  GSV prox calf no                            .342              +--------------+---------+------+-----------+------------+--------+  GSV mid calf  no                            .347              +--------------+---------+------+-----------+------------+--------+  SSV Pop Fossa no                            .241              +--------------+---------+------+-----------+------------+--------+  SSV prox calf no                            .216              +--------------+---------+------+-----------+------------+--------+   Summary:  Right:  - No evidence of deep vein thrombosis seen in the right lower extremity, from the common femoral through the popliteal veins.  - No evidence of superficial venous thrombosis in the right lower extremity.  - Venous reflux is noted in the right sapheno-femoral junction.  - Venous reflux is noted in the right greater saphenous vein in the thigh.     CHARVIK ALLDREDGE is a 77 y.o. male who presents with: BLE swelling with venous  ulceration right leg as pictured above.      -pt does not have palpable pedal pulses.  His doppler flow in the right foot is monophasic and his ABI on the right was 0.68.  he was not able to tolerate the unna boot.  I put a wet to dry dressing with compression ace wrap on his right leg.  I gave him supplies to do this 1-2 times per day.  He has follow up appt with Dr. Lenell Antu in 2 weeks.  Discussed with pt that we will see if compression and leg elevation help to heal his wound.  Discussed he may not be able to elevate as needed due to his arterial insufficiency.  Also discussed if the wrap is painful, he can remove it and re-wrap with a milder compression.  I encouraged him to continue wet to dry dressing and not use moisturizer on his wound.  -pt does not have evidence of DVT.  Pt does have venous reflux in the right GSV at the [facility] and throughout the thigh, however, the vein is not of adequate size for laser ablation.  -discussed with pt to try to wear his compression on the left leg and to put on before getting out of bed  -discussed the importance  of leg elevation and how to elevate properly - pt is advised to elevate their legs and a diagram is given to them to demonstrate for pt to lay flat on their back with knees elevated and slightly bent with their feet higher than their knees, which puts their feet higher than their heart for 15 minutes per day.  If pt cannot lay flat, advised to lay as flat as possible.  -pt is advised to continue as much walking as possible and avoid sitting or standing for long periods of time.  -discussed importance of maintaining a normal weight and exercise and that water aerobics would also be beneficial once the wound has healed.  -handout with recommendations given -pt will f/u in 2 weeks with Dr. Lenell Antu in [place].    Doreatha Massed, [facility] [phone]  Clinic MD:  Myra Gianotti

## 2023-05-07 ENCOUNTER — Telehealth: Payer: Self-pay

## 2023-05-07 NOTE — Telephone Encounter (Signed)
Called pt regarding his MyChart message. He is c/o pain that he feels is from nerves. He has discussed taking something for this with his PCP. He decided to hold off at that time due to the side effects but is now wanting to get on something. I have advised him to reach back out to his PCP for this. Pt verbalized understanding. He has f/u with MD in 2 weeks and is aware of this appt.

## 2023-05-15 ENCOUNTER — Encounter: Payer: PPO | Admitting: Vascular Surgery

## 2023-05-22 ENCOUNTER — Encounter: Payer: Self-pay | Admitting: Vascular Surgery

## 2023-05-22 ENCOUNTER — Ambulatory Visit (INDEPENDENT_AMBULATORY_CARE_PROVIDER_SITE_OTHER): Payer: PPO | Admitting: Vascular Surgery

## 2023-05-22 DIAGNOSIS — I83013 Varicose veins of right lower extremity with ulcer of ankle: Secondary | ICD-10-CM | POA: Diagnosis not present

## 2023-05-22 DIAGNOSIS — L97319 Non-pressure chronic ulcer of right ankle with unspecified severity: Secondary | ICD-10-CM | POA: Diagnosis not present

## 2023-05-22 NOTE — Progress Notes (Signed)
 [facility] [place]  ASSESSMENT / PLAN: Roberto Rosales is a 78 y.o. male with venous ulceration of the right ankle.  He had an allergic reaction to the Unna boot medicated wrap.  I rewrapped him in a dry dressing with a Telfa overlaying the ulcer.  Will change this weekly.  Venous duplex shows incompetent greater saphenous vein, and should be considered for endovenous ablation.  CHIEF COMPLAINT: Right ankle ulceration  HISTORY OF PRESENT ILLNESS: Roberto Rosales is a 78 y.o. male referred to clinic for evaluation of right ankle ulceration.  He has a history of chronic wounds in the bilateral ankles and areas typical venous ulceration above the malleoli.  His significant swelling in his bilateral lower extremities with venous stasis dermatitis and reticular veins.  He has neuropathy about his feet and does not have a sensation about his feet.  05/21/22: Patient returns to clinic.  He is having difficulty with Unna boot changes.  He has had significant discomfort after the zinc  oxide wrap has been applied.  He is concerned he has had an allergic type reaction to this.  Past Medical History:  Diagnosis Date   Epistaxis    Glaucoma    High cholesterol    Neuropathy    Peripheral arterial disease [facility])     Past Surgical History:  Procedure Laterality Date   BACK SURGERY     CATARACT EXTRACTION Bilateral 2007   COLONOSCOPY  11/04/2007   Dr. Barry, normal exam.  Recommended repeat colonoscopy in 10 years.   COLONOSCOPY WITH PROPOFOL  N/A 02/23/2020   Procedure: COLONOSCOPY WITH PROPOFOL ;  Surgeon: Cindie Carlin MARLA, DO;  [facility];  Service: Endoscopy;  Laterality: N/A;  7:30am   NASAL ENDOSCOPY WITH EPISTAXIS CONTROL Right 10/11/2017   Procedure: NASAL CAUTERY AND  BIOPSY OF RIGHT NASAL GRANULATION TISSUE;  Surgeon: Karis Clunes, MD;  Location: [facility];  Service: ENT;  Laterality: Right;   NASAL ENDOSCOPY WITH EPISTAXIS CONTROL Right 10/22/2017   Procedure:  NASAL ENDOSCOPY WITH RIGHT EPISTAXIS CONTROL;  Surgeon: Karis Clunes, MD;  Location: [facility];  Service: ENT;  Laterality: Right;   POLYPECTOMY  02/23/2020   Procedure: POLYPECTOMY;  Surgeon: Cindie Carlin MARLA, DO;  [facility];  Service: Endoscopy;;   TONSILLECTOMY  1950    Family History  Problem Relation Age of Onset   Cerebral aneurysm Mother    Heart disease Father    Stroke Father    Neuropathy Neg Hx    Colon cancer Neg Hx     Social History   Socioeconomic History   Marital status: Married    Spouse name: Not on file   Number of children: 2   Years of education: 14   Highest education level: Not on file  Occupational History   Occupation: Retired  Tobacco Use   Smoking status: Never   Smokeless tobacco: Never  Vaping Use   Vaping status: Never Used  Substance and Sexual Activity   Alcohol  use: No   Drug use: No   Sexual activity: Not Currently    Partners: Female  Other Topics Concern   Not on file  Social History Narrative   Lives at home w/ his wife   Right-handed   Caffeine: 1 - 1 1/2 cups coffee per day   Social Drivers of Health   Financial Resource Strain: Not on file  Food Insecurity: No Food Insecurity (04/05/2023)   Hunger Vital Sign    Worried  About Running Out of Food in the Last Year: Never true    Ran Out of Food in the Last Year: Never true  Transportation Needs: No Transportation Needs (04/05/2023)   PRAPARE - Administrator, Civil Service (Medical): No    Lack of Transportation (Non-Medical): No  Physical Activity: Not on file  Stress: Not on file  Social Connections: Not on file  Intimate Partner Violence: Not At Risk (04/05/2023)   Humiliation, Afraid, Rape, and Kick questionnaire    Fear of Current or Ex-Partner: No    Emotionally Abused: No    Physically Abused: No    Sexually Abused: No    Allergies  Allergen Reactions   Codeine Nausea And Vomiting    Current Outpatient Medications   Medication Sig Dispense Refill   acetaminophen  (TYLENOL ) 325 MG tablet Take 2 tablets (650 mg total) by mouth every 6 (six) hours as needed for mild pain (pain score 1-3) (or Fever >/= 101).     aspirin  EC 81 MG tablet Take 1 tablet (81 mg total) by mouth daily with breakfast. Swallow whole. 100 tablet 3   atorvastatin  (LIPITOR) 10 MG tablet Take 5 mg by mouth at bedtime.      dorzolamide -timolol  (COSOPT ) 22.3-6.8 MG/ML ophthalmic solution Place 1 drop into the left eye 2 (two) times daily.     furosemide (LASIX) 20 MG tablet Take 20 mg by mouth daily.     hydrocortisone  cream 1 % Apply topically 2 (two) times daily as needed for itching. Apply Sparingly 30 g 0   latanoprost  (XALATAN ) 0.005 % ophthalmic solution Place 1 drop into both eyes at bedtime.      Multiple Vitamin (MULTIVITAMIN WITH MINERALS) TABS tablet Take 1 tablet by mouth daily.     traMADol  (ULTRAM ) 50 MG tablet Take 1 tablet (50 mg total) by mouth every 12 (twelve) hours as needed. 15 tablet 0   triamcinolone cream (KENALOG) 0.1 % Apply 1 Application topically 3 (three) times daily.     No current facility-administered medications for this visit.    PHYSICAL EXAM Elderly man in no distress Regular rate and rhythm Unlabored breathing Unchanged appearance of venous ulcer of the right ankle  PERTINENT LABORATORY AND RADIOLOGIC DATA  Most recent CBC    Latest Ref Rng & Units 04/06/2023    4:25 AM 04/05/2023    6:21 PM 11/01/2019    5:15 AM  CBC  WBC 4.0 - 10.5 K/uL 6.3  8.8  9.2   Hemoglobin 13.0 - 17.0 g/dL 87.1  85.6  85.6   Hematocrit 39.0 - 52.0 % 37.0  40.8  42.3   Platelets 150 - 400 K/uL 152  182  158      Most recent CMP    Latest Ref Rng & Units 04/07/2023    4:54 AM 04/06/2023    4:25 AM 04/05/2023    6:21 PM  CMP  Glucose 70 - 99 mg/dL  88  873   BUN 8 - 23 mg/dL  17  21   Creatinine 9.38 - 1.24 mg/dL 8.82  8.97  9.03   Sodium 135 - 145 mmol/L  135  131   Potassium 3.5 - 5.1 mmol/L  4.1  4.1    Chloride 98 - 111 mmol/L  106  102   CO2 22 - 32 mmol/L  21  20   Calcium  8.9 - 10.3 mg/dL  8.8  9.1   Total Protein 6.5 - 8.1 g/dL  5.8  Total Bilirubin <1.2 mg/dL  0.9    Alkaline Phos 38 - 126 U/L  57    AST 15 - 41 U/L  23    ALT 0 - 44 U/L  21      Renal function CrCl cannot be calculated (Patient's most recent lab result is older than the maximum 21 days allowed.).  Hgb A1c MFr Bld (%)  Date Value  02/22/2017 5.4   CLINICAL DATA:  Peripheral arterial disease   EXAM:  NONINVASIVE PHYSIOLOGIC VASCULAR STUDY OF BILATERAL LOWER  EXTREMITIES   TECHNIQUE:  Evaluation of both lower extremities were performed at rest,  including calculation of ankle-brachial indices with single level  Doppler, pressure and pulse volume recording.   COMPARISON:  None Available.   FINDINGS:  Right ABI:  0.68   Left ABI:  1.1   Right Lower Extremity:  Blunted biphasic arterial waveforms.   Left Lower Extremity:  Normal arterial waveforms at the ankle.   0.5-0.79 Moderate PAD   IMPRESSION:  1. Depressed right ankle-brachial index of 0.68 consistent with  moderate underlying peripheral arterial disease.  2. The resting left ankle-brachial index is normal.   Debby SAILOR. Magda, MD FACS [facility] Phone Number: [phone] 05/22/2023 4:17 PM   Total time spent on preparing this encounter including chart review, data review, collecting history, examining the patient, coordinating care for this established patient, 20 minutes  Portions of this report may have been transcribed using voice recognition software.  Every effort has been made to ensure accuracy; however, inadvertent computerized transcription errors may still be present.

## 2023-05-29 ENCOUNTER — Ambulatory Visit: Payer: PPO | Admitting: Physician Assistant

## 2023-05-29 VITALS — BP 157/84 | HR 71 | Ht 72.0 in | Wt 193.8 lb

## 2023-05-29 DIAGNOSIS — I83013 Varicose veins of right lower extremity with ulcer of ankle: Secondary | ICD-10-CM | POA: Diagnosis not present

## 2023-05-29 DIAGNOSIS — L97319 Non-pressure chronic ulcer of right ankle with unspecified severity: Secondary | ICD-10-CM

## 2023-05-29 NOTE — Progress Notes (Signed)
Office Note     CC:  follow up Requesting Provider:  [facility]  HPI: Roberto Rosales is a 78 y.o. (12/19/1945) male who presents for evaluation of right medial ankle venous ulceration.  He initially was placed in an [employer] however he had an allergic reaction to the zinc oxide.  He has been treating the venous ulceration on his own.  He has not been wearing compression.  He was also referred for further evaluation for potential right greater saphenous vein ablation by Dr. Lenell Antu.  He believes the venous ulcer is slowly improving.  He has no history of claudication, rest pain, or tissue loss at the level of the feet.  He denies fevers, chills, nausea/vomiting.   Past Medical History:  Diagnosis Date   Epistaxis    Glaucoma    High cholesterol    Neuropathy    Peripheral arterial disease [facility])     Past Surgical History:  Procedure Laterality Date   BACK SURGERY     CATARACT EXTRACTION Bilateral 2007   COLONOSCOPY  11/04/2007   Dr. Cleotis Nipper, normal exam.  Recommended repeat colonoscopy in 10 years.   COLONOSCOPY WITH PROPOFOL N/A 02/23/2020   Procedure: COLONOSCOPY WITH PROPOFOL;  Surgeon: Lanelle Bal, DO;  [facility];  Service: Endoscopy;  Laterality: N/A;  7:30am   NASAL ENDOSCOPY WITH EPISTAXIS CONTROL Right 10/11/2017   Procedure: NASAL CAUTERY AND  BIOPSY OF RIGHT NASAL GRANULATION TISSUE;  Surgeon: Newman Pies, MD;  Location: [facility];  Service: ENT;  Laterality: Right;   NASAL ENDOSCOPY WITH EPISTAXIS CONTROL Right 10/22/2017   Procedure: NASAL ENDOSCOPY WITH RIGHT EPISTAXIS CONTROL;  Surgeon: Newman Pies, MD;  Location: [facility];  Service: ENT;  Laterality: Right;   POLYPECTOMY  02/23/2020   Procedure: POLYPECTOMY;  Surgeon: Lanelle Bal, DO;  [facility];  Service: Endoscopy;;   TONSILLECTOMY  1950    Social History   Socioeconomic History   Marital status: Married    Spouse name: Not on file   Number of  children: 2   Years of education: 14   Highest education level: Not on file  Occupational History   Occupation: Retired  Tobacco Use   Smoking status: Never   Smokeless tobacco: Never  Vaping Use   Vaping status: Never Used  Substance and Sexual Activity   Alcohol use: No   Drug use: No   Sexual activity: Not Currently    Partners: Female  Other Topics Concern   Not on file  Social History Narrative   Lives at home w/ his wife   Right-handed   Caffeine: 1 - 1 1/2 cups coffee per day   Social Drivers of Health   Financial Resource Strain: Not on file  Food Insecurity: No Food Insecurity (04/05/2023)   Hunger Vital Sign    Worried About Running Out of Food in the Last Year: Never true    Ran Out of Food in the Last Year: Never true  Transportation Needs: No Transportation Needs (04/05/2023)   PRAPARE - Administrator, Civil Service (Medical): No    Lack of Transportation (Non-Medical): No  Physical Activity: Not on file  Stress: Not on file  Social Connections: Not on file  Intimate Partner Violence: Not At Risk (04/05/2023)   Humiliation, Afraid, Rape, and Kick questionnaire    Fear of Current or Ex-Partner: No    Emotionally Abused: No    Physically Abused: No  Sexually Abused: No    Family History  Problem Relation Age of Onset   Cerebral aneurysm Mother    Heart disease Father    Stroke Father    Neuropathy Neg Hx    Colon cancer Neg Hx     Current Outpatient Medications  Medication Sig Dispense Refill   acetaminophen (TYLENOL) 325 MG tablet Take 2 tablets (650 mg total) by mouth every 6 (six) hours as needed for mild pain (pain score 1-3) (or Fever >/= 101).     aspirin EC 81 MG tablet Take 1 tablet (81 mg total) by mouth daily with breakfast. Swallow whole. 100 tablet 3   atorvastatin (LIPITOR) 10 MG tablet Take 5 mg by mouth at bedtime.      dorzolamide-timolol (COSOPT) 22.3-6.8 MG/ML ophthalmic solution Place 1 drop into the left eye 2  (two) times daily.     gabapentin (NEURONTIN) 300 MG capsule Take 300 mg by mouth at bedtime.     hydrocortisone 2.5 % cream Apply topically 3 (three) times daily.     latanoprost (XALATAN) 0.005 % ophthalmic solution Place 1 drop into both eyes at bedtime.      Multiple Vitamin (MULTIVITAMIN WITH MINERALS) TABS tablet Take 1 tablet by mouth daily.     mupirocin ointment (BACTROBAN) 2 % SMARTSIG:0.5 Gram(s) Topical 3 Times Daily     traMADol (ULTRAM) 50 MG tablet Take 1 tablet (50 mg total) by mouth every 12 (twelve) hours as needed. 15 tablet 0   No current facility-administered medications for this visit.    Allergies  Allergen Reactions   Codeine Nausea And Vomiting     REVIEW OF SYSTEMS:   [X]  denotes positive finding, [ ]  denotes negative finding Cardiac  Comments:  Chest pain or chest pressure:    Shortness of breath upon exertion:    Short of breath when lying flat:    Irregular heart rhythm:        Vascular    Pain in calf, thigh, or hip brought on by ambulation:    Pain in feet at night that wakes you up from your sleep:     Blood clot in your veins:    Leg swelling:         Pulmonary    Oxygen at home:    Productive cough:     Wheezing:         Neurologic    Sudden weakness in arms or legs:     Sudden numbness in arms or legs:     Sudden onset of difficulty speaking or slurred speech:    Temporary loss of vision in one eye:     Problems with dizziness:         Gastrointestinal    Blood in stool:     Vomited blood:         Genitourinary    Burning when urinating:     Blood in urine:        Psychiatric    Major depression:         Hematologic    Bleeding problems:    Problems with blood clotting too easily:        Skin    Rashes or ulcers:        Constitutional    Fever or chills:      PHYSICAL EXAMINATION:  Vitals:   05/29/23 1336  BP: (!) 157/84  Pulse: 71  Weight: 193 lb 12.8 oz (87.9 kg)  Height: 6' (1.829 m)  General:  WDWN in  NAD; vital signs documented above Gait: Not observed HENT: WNL, normocephalic Pulmonary: normal non-labored breathing , without Rales, rhonchi,  wheezing Cardiac: regular HR Abdomen: soft, NT, no masses Skin: without rashes Vascular Exam/Pulses: Palpable DP and PT pulses bilaterally Extremities: Venous ulceration of the right medial lower leg; hyperpigmentation of both legs with venous congestion at the feet and ankles Musculoskeletal: no muscle wasting or atrophy  Neurologic: A&O X 3 Psychiatric:  The pt has Normal affect.     ASSESSMENT/PLAN:: 78 y.o. male here for follow up for evaluation of right lower leg venous ulceration  Mr. Kalil is a 78 year old with venous insufficiency and venous ulceration of the right lower leg.  He is unable to tolerate Unna boot due to reaction to zinc oxide.  He is also unable to tolerate Unna boots without zinc oxide.  We discussed the need for aggressive compression in order to allow the venous ulceration to heal.  He has agreed to use a local nonadhesive bandage in the area of the ulcer and wear a knee-high compression sock over top.  He should wear this starting in the morning and take them off in the evening.  We also discussed proper leg elevation to encourage venous return to the heart.  He should also avoid prolonged sitting and standing.  He has already been referred to Dr. Pascal Lux or Dr. Myra Gianotti for evaluation for possible right greater saphenous vein ablation.  He will return in 1 month for wound check.   Emilie Rutter, PA-C [facility] [phone]  Clinic MD:   Chestine Spore

## 2023-06-01 DIAGNOSIS — I10 Essential (primary) hypertension: Secondary | ICD-10-CM | POA: Diagnosis not present

## 2023-06-01 DIAGNOSIS — E7800 Pure hypercholesterolemia, unspecified: Secondary | ICD-10-CM | POA: Diagnosis not present

## 2023-06-01 DIAGNOSIS — E782 Mixed hyperlipidemia: Secondary | ICD-10-CM | POA: Diagnosis not present

## 2023-06-01 DIAGNOSIS — R7301 Impaired fasting glucose: Secondary | ICD-10-CM | POA: Diagnosis not present

## 2023-06-06 DIAGNOSIS — M21371 Foot drop, right foot: Secondary | ICD-10-CM | POA: Diagnosis not present

## 2023-06-06 DIAGNOSIS — E782 Mixed hyperlipidemia: Secondary | ICD-10-CM | POA: Diagnosis not present

## 2023-06-06 DIAGNOSIS — R7301 Impaired fasting glucose: Secondary | ICD-10-CM | POA: Diagnosis not present

## 2023-06-06 DIAGNOSIS — Z6827 Body mass index (BMI) 27.0-27.9, adult: Secondary | ICD-10-CM | POA: Diagnosis not present

## 2023-06-06 DIAGNOSIS — I10 Essential (primary) hypertension: Secondary | ICD-10-CM | POA: Diagnosis not present

## 2023-06-13 DIAGNOSIS — Z0001 Encounter for general adult medical examination with abnormal findings: Secondary | ICD-10-CM | POA: Diagnosis not present

## 2023-06-13 DIAGNOSIS — Z6827 Body mass index (BMI) 27.0-27.9, adult: Secondary | ICD-10-CM | POA: Diagnosis not present

## 2023-06-25 ENCOUNTER — Telehealth: Payer: Self-pay

## 2023-06-25 NOTE — Telephone Encounter (Signed)
Advice/Appointment: -pt called stating his pain with the ulcer near the ankle is getting worse.   -reviewed chart, returned call to pt w/o answer or able to leave message.

## 2023-06-26 DIAGNOSIS — I83008 Varicose veins of unspecified lower extremity with ulcer other part of lower leg: Secondary | ICD-10-CM | POA: Diagnosis not present

## 2023-06-26 DIAGNOSIS — L97909 Non-pressure chronic ulcer of unspecified part of unspecified lower leg with unspecified severity: Secondary | ICD-10-CM | POA: Diagnosis not present

## 2023-06-26 DIAGNOSIS — Z6827 Body mass index (BMI) 27.0-27.9, adult: Secondary | ICD-10-CM | POA: Diagnosis not present

## 2023-06-26 DIAGNOSIS — L03115 Cellulitis of right lower limb: Secondary | ICD-10-CM | POA: Diagnosis not present

## 2023-07-03 ENCOUNTER — Ambulatory Visit: Payer: PPO

## 2023-07-04 DIAGNOSIS — I83891 Varicose veins of right lower extremities with other complications: Secondary | ICD-10-CM | POA: Diagnosis not present

## 2023-07-04 DIAGNOSIS — L97818 Non-pressure chronic ulcer of other part of right lower leg with other specified severity: Secondary | ICD-10-CM | POA: Diagnosis not present

## 2023-07-04 DIAGNOSIS — I83893 Varicose veins of bilateral lower extremities with other complications: Secondary | ICD-10-CM | POA: Diagnosis not present

## 2023-07-04 DIAGNOSIS — I739 Peripheral vascular disease, unspecified: Secondary | ICD-10-CM | POA: Diagnosis not present

## 2023-07-04 DIAGNOSIS — I872 Venous insufficiency (chronic) (peripheral): Secondary | ICD-10-CM | POA: Diagnosis not present

## 2023-07-04 DIAGNOSIS — I83013 Varicose veins of right lower extremity with ulcer of ankle: Secondary | ICD-10-CM | POA: Diagnosis not present

## 2023-07-04 DIAGNOSIS — L03115 Cellulitis of right lower limb: Secondary | ICD-10-CM | POA: Diagnosis not present

## 2023-07-04 DIAGNOSIS — Z885 Allergy status to narcotic agent status: Secondary | ICD-10-CM | POA: Diagnosis not present

## 2023-07-04 DIAGNOSIS — L97319 Non-pressure chronic ulcer of right ankle with unspecified severity: Secondary | ICD-10-CM | POA: Diagnosis not present

## 2023-07-04 DIAGNOSIS — Z86718 Personal history of other venous thrombosis and embolism: Secondary | ICD-10-CM | POA: Diagnosis not present

## 2023-07-04 DIAGNOSIS — Z823 Family history of stroke: Secondary | ICD-10-CM | POA: Diagnosis not present

## 2023-07-10 DIAGNOSIS — L97319 Non-pressure chronic ulcer of right ankle with unspecified severity: Secondary | ICD-10-CM | POA: Diagnosis not present

## 2023-07-10 DIAGNOSIS — E785 Hyperlipidemia, unspecified: Secondary | ICD-10-CM | POA: Diagnosis not present

## 2023-07-10 DIAGNOSIS — I739 Peripheral vascular disease, unspecified: Secondary | ICD-10-CM | POA: Diagnosis not present

## 2023-07-10 DIAGNOSIS — I83019 Varicose veins of right lower extremity with ulcer of unspecified site: Secondary | ICD-10-CM | POA: Diagnosis not present

## 2023-07-10 DIAGNOSIS — I743 Embolism and thrombosis of arteries of the lower extremities: Secondary | ICD-10-CM | POA: Diagnosis not present

## 2023-07-20 ENCOUNTER — Observation Stay (HOSPITAL_COMMUNITY)
Admission: EM | Admit: 2023-07-20 | Discharge: 2023-07-21 | Disposition: A | Discharge status: Home / Self Care | Attending: Internal Medicine | Admitting: Internal Medicine

## 2023-07-20 ENCOUNTER — Other Ambulatory Visit: Payer: Self-pay

## 2023-07-20 ENCOUNTER — Emergency Department (HOSPITAL_COMMUNITY)

## 2023-07-20 ENCOUNTER — Encounter (HOSPITAL_COMMUNITY): Payer: Self-pay | Admitting: Emergency Medicine

## 2023-07-20 DIAGNOSIS — L089 Local infection of the skin and subcutaneous tissue, unspecified: Secondary | ICD-10-CM | POA: Diagnosis not present

## 2023-07-20 DIAGNOSIS — L97219 Non-pressure chronic ulcer of right calf with unspecified severity: Secondary | ICD-10-CM | POA: Diagnosis not present

## 2023-07-20 DIAGNOSIS — I739 Peripheral vascular disease, unspecified: Secondary | ICD-10-CM

## 2023-07-20 DIAGNOSIS — L03115 Cellulitis of right lower limb: Principal | ICD-10-CM | POA: Insufficient documentation

## 2023-07-20 DIAGNOSIS — S8990XA Unspecified injury of unspecified lower leg, initial encounter: Secondary | ICD-10-CM | POA: Diagnosis not present

## 2023-07-20 DIAGNOSIS — Z7982 Long term (current) use of aspirin: Secondary | ICD-10-CM | POA: Diagnosis not present

## 2023-07-20 DIAGNOSIS — M19071 Primary osteoarthritis, right ankle and foot: Secondary | ICD-10-CM | POA: Diagnosis not present

## 2023-07-20 DIAGNOSIS — E785 Hyperlipidemia, unspecified: Secondary | ICD-10-CM

## 2023-07-20 DIAGNOSIS — M7989 Other specified soft tissue disorders: Secondary | ICD-10-CM | POA: Diagnosis not present

## 2023-07-20 DIAGNOSIS — S81801A Unspecified open wound, right lower leg, initial encounter: Secondary | ICD-10-CM | POA: Diagnosis present

## 2023-07-20 DIAGNOSIS — M79604 Pain in right leg: Secondary | ICD-10-CM | POA: Diagnosis present

## 2023-07-20 DIAGNOSIS — E871 Hypo-osmolality and hyponatremia: Secondary | ICD-10-CM | POA: Diagnosis present

## 2023-07-20 DIAGNOSIS — Z0389 Encounter for observation for other suspected diseases and conditions ruled out: Secondary | ICD-10-CM | POA: Diagnosis not present

## 2023-07-20 DIAGNOSIS — Z79899 Other long term (current) drug therapy: Secondary | ICD-10-CM | POA: Insufficient documentation

## 2023-07-20 DIAGNOSIS — M7651 Patellar tendinitis, right knee: Secondary | ICD-10-CM | POA: Diagnosis not present

## 2023-07-20 DIAGNOSIS — M7731 Calcaneal spur, right foot: Secondary | ICD-10-CM | POA: Diagnosis not present

## 2023-07-20 DIAGNOSIS — Z789 Other specified health status: Secondary | ICD-10-CM | POA: Diagnosis not present

## 2023-07-20 DIAGNOSIS — I10 Essential (primary) hypertension: Secondary | ICD-10-CM | POA: Diagnosis not present

## 2023-07-20 LAB — CBC WITH DIFFERENTIAL/PLATELET
Abs Immature Granulocytes: 0.03 10*3/uL (ref 0.00–0.07)
Basophils Absolute: 0 10*3/uL (ref 0.0–0.1)
Basophils Relative: 1 %
Eosinophils Absolute: 0.3 10*3/uL (ref 0.0–0.5)
Eosinophils Relative: 5 %
HCT: 36.3 % — ABNORMAL LOW (ref 39.0–52.0)
Hemoglobin: 12.5 g/dL — ABNORMAL LOW (ref 13.0–17.0)
Immature Granulocytes: 1 %
Lymphocytes Relative: 23 %
Lymphs Abs: 1.3 10*3/uL (ref 0.7–4.0)
MCH: 29.6 pg (ref 26.0–34.0)
MCHC: 34.4 g/dL (ref 30.0–36.0)
MCV: 85.8 fL (ref 80.0–100.0)
Monocytes Absolute: 0.5 10*3/uL (ref 0.1–1.0)
Monocytes Relative: 9 %
Neutro Abs: 3.5 10*3/uL (ref 1.7–7.7)
Neutrophils Relative %: 61 %
Platelets: 168 10*3/uL (ref 150–400)
RBC: 4.23 MIL/uL (ref 4.22–5.81)
RDW: 13.2 % (ref 11.5–15.5)
WBC: 5.7 10*3/uL (ref 4.0–10.5)
nRBC: 0 % (ref 0.0–0.2)

## 2023-07-20 MED ORDER — VANCOMYCIN HCL 1750 MG/350ML IV SOLN
1750.0000 mg | Freq: Once | INTRAVENOUS | Status: AC
  Administered 2023-07-21: 1750 mg via INTRAVENOUS
  Filled 2023-07-20: qty 350

## 2023-07-20 MED ORDER — OXYCODONE-ACETAMINOPHEN 5-325 MG PO TABS
1.0000 | ORAL_TABLET | Freq: Once | ORAL | Status: AC
  Administered 2023-07-20: 1 via ORAL
  Filled 2023-07-20: qty 1

## 2023-07-20 MED ORDER — LACTATED RINGERS IV BOLUS (SEPSIS)
500.0000 mL | Freq: Once | INTRAVENOUS | Status: AC
  Administered 2023-07-20: 500 mL via INTRAVENOUS

## 2023-07-20 NOTE — ED Triage Notes (Signed)
 BIB EMS.  Pt has venous ulcer to right calf.  Finished anbx today.  Increased pain.

## 2023-07-20 NOTE — Progress Notes (Signed)
 ED Pharmacy Antibiotic Sign Off An antibiotic consult was received from an ED provider for vancomycin per pharmacy dosing for cellulits. A chart review was completed to assess appropriateness.   The following one time order(s) were placed:  Vancomycin 1750mg  IV x1  Further antibiotic and/or antibiotic pharmacy consults should be ordered by the admitting provider if indicated.   Thank you for allowing pharmacy to be a part of this patient's care.   Vernard Gambles, PharmD, BCPS   Clinical Pharmacist 07/20/23 11:49 PM

## 2023-07-20 NOTE — ED Provider Notes (Signed)
 [place] EMERGENCY DEPARTMENT AT [facility] Provider Note   CSN: [id number] Arrival date & time: 07/20/23  2302     History  Chief Complaint  Patient presents with   Leg Pain    Roberto Rosales is a 78 y.o. male.   Leg Pain Patient presents for leg pain.  For the past 4 months, he has had an ulcer on his right lower extremity.  He has undergone multiple courses of antibiotics for this.  He was prescribed clindamycin with no improvement.  He was then switched to doxycycline.  10 days ago, he underwent debridement with wound cultures.  Based on culture results, he was switched to Keflex.  He completed a 10 of Keflex today.  Tonight, he had worsened pain in the area of the ulcer.  He has had some intermittent recent chills.  He denies any other associated symptoms.     Home Medications Prior to Admission medications   Medication Sig Start Date End Date Taking? Authorizing Provider  acetaminophen (TYLENOL) 325 MG tablet Take 2 tablets (650 mg total) by mouth every 6 (six) hours as needed for mild pain (pain score 1-3) (or Fever >/= 101). 04/07/23   Shon Hale, MD  aspirin EC 81 MG tablet Take 1 tablet (81 mg total) by mouth daily with breakfast. Swallow whole. 04/08/23   Shon Hale, MD  atorvastatin (LIPITOR) 10 MG tablet Take 5 mg by mouth at bedtime.  08/16/16   [provider]  dorzolamide-timolol (COSOPT) 22.3-6.8 MG/ML ophthalmic solution Place 1 drop into the left eye 2 (two) times daily.    [provider]  gabapentin (NEURONTIN) 300 MG capsule Take 300 mg by mouth at bedtime. 05/07/23   [provider]  hydrocortisone 2.5 % cream Apply topically 3 (three) times daily. 05/16/23   [provider]  latanoprost (XALATAN) 0.005 % ophthalmic solution Place 1 drop into both eyes at bedtime.  07/18/16   [provider]  Multiple Vitamin (MULTIVITAMIN WITH MINERALS) TABS tablet Take 1 tablet by mouth daily.    [provider]  mupirocin ointment (BACTROBAN) 2 % SMARTSIG:0.5 Gram(s) Topical 3 Times Daily 04/19/23   [provider]  traMADol (ULTRAM) 50 MG tablet Take 1 tablet (50 mg total) by mouth every 12 (twelve) hours as needed. 04/07/23   Shon Hale, MD      Allergies    Codeine    Review of Systems   Review of Systems  Constitutional:  Positive for chills.  Skin:  Positive for wound.  All other systems reviewed and are negative.   Physical Exam Updated Vital Signs BP (!) 149/68   Pulse (!) 55   Temp 98.2 F (36.8 C) (Oral)   Resp 10   Ht 6' (1.829 m)   Wt 87.9 kg   SpO2 98%   BMI 26.28 kg/m  Physical Exam Vitals and nursing note reviewed.  Constitutional:      General: He is not in acute distress.    Appearance: Normal appearance. He is well-developed. He is not ill-appearing, toxic-appearing or diaphoretic.  HENT:     Head: Normocephalic and atraumatic.     Right Ear: External ear normal.     Left Ear: External ear normal.     Nose: Nose normal.     Mouth/Throat:     Mouth: Mucous membranes are moist.  Eyes:     Extraocular Movements: Extraocular movements intact.     Conjunctiva/sclera: Conjunctivae normal.  Cardiovascular:  Rate and Rhythm: Normal rate and regular rhythm.  Pulmonary:     Effort: Pulmonary effort is normal. No respiratory distress.  Abdominal:     General: There is no distension.     Palpations: Abdomen is soft.  Musculoskeletal:     Cervical back: Normal range of motion and neck supple.     Right lower leg: Edema present.     Left lower leg: Edema present.  Skin:    General: Skin is warm and dry.     Capillary Refill: Capillary refill takes less than 2 seconds.     Coloration: Skin is not jaundiced or pale.  Neurological:     General: No focal deficit present.     Mental Status: He is alert and oriented to person, place, and time.  Psychiatric:        Mood and Affect: Mood normal.        Behavior: Behavior normal.      ED Results / Procedures / Treatments   Labs (all labs ordered are listed, but only abnormal results are displayed) Labs Reviewed  COMPREHENSIVE METABOLIC PANEL - Abnormal; Notable for the following components:      Result Value   Sodium 134 (*)    Glucose, Bld 108 (*)    Calcium 8.7 (*)    ALT 50 (*)    All other components within normal limits  CBC WITH DIFFERENTIAL/PLATELET - Abnormal; Notable for the following components:   Hemoglobin 12.5 (*)    HCT 36.3 (*)    All other components within normal limits  CULTURE, BLOOD (ROUTINE X 2)  CULTURE, BLOOD (ROUTINE X 2)  LACTIC ACID, PLASMA  PROTIME-INR  MAGNESIUM  SEDIMENTATION RATE  C-REACTIVE PROTEIN    EKG None  Radiology CT TIBIA FIBULA RIGHT W CONTRAST Result Date: 07/21/2023 CLINICAL DATA:  Soft tissue infection suspected, lower leg, xray done EXAM: CT OF THE LOWER RIGHT EXTREMITY WITH CONTRAST TECHNIQUE: Multidetector CT imaging of the lower right extremity was performed according to the standard protocol following intravenous contrast administration. RADIATION DOSE REDUCTION: This exam was performed according to the departmental dose-optimization program which includes automated exposure control, adjustment of the mA and/or kV according to patient size and/or use of iterative reconstruction technique. CONTRAST:  75mL OMNIPAQUE IOHEXOL 300 MG/ML  SOLN COMPARISON:  CT right tibia fibula 07/20/2023 FINDINGS: Bones/Joint/Cartilage No evidence of fracture, dislocation, or joint effusion. Patellar enthesopathy. Degenerative changes of the infra syndesmotic distal fibula. No evidence of severe arthropathy. No aggressive appearing focal bone abnormality. Ligaments Suboptimally assessed by CT. Muscles and Tendons Grossly unremarkable. Soft tissues Diffuse subcutaneus soft tissue edema of the leg. No subcutaneus soft tissue emphysema. No organized fluid collection. Vascular calcifications. IMPRESSION: 1. Diffuse subcutaneus soft tissue  edema of the leg. No subcutaneus soft tissue emphysema. No organized fluid collection. Please note a necrotizing fasciitis cannot be excluded as this is a clinical diagnosis. 2.  No acute displaced fracture or dislocation. Electronically Signed   By: Tish Frederickson M.D.   On: 07/21/2023 01:10   DG Chest Port 1 View Result Date: 07/21/2023 CLINICAL DATA:  Questionable sepsis - evaluate for abnormality EXAM: PORTABLE CHEST 1 VIEW COMPARISON:  06/22/2021 FINDINGS: Heart and mediastinal contours are within normal limits. No focal opacities or effusions. No acute bony abnormality. IMPRESSION: No active disease. Electronically Signed   By: Charlett Nose M.D.   On: 07/21/2023 00:06   DG Tibia/Fibula Right Result Date: 07/21/2023 CLINICAL DATA:  Venous ulcer right calf EXAM: RIGHT TIBIA  AND FIBULA - 2 VIEW COMPARISON:  None Available. FINDINGS: There is no evidence of fracture or other focal bone lesions. Soft tissues are unremarkable. No evidence of osteomyelitis. IMPRESSION: No acute bony abnormality. Electronically Signed   By: Charlett Nose M.D.   On: 07/21/2023 00:05   DG Ankle 2 Views Right Result Date: 07/21/2023 CLINICAL DATA:  Questionable sepsis - evaluate for abnormality. Ulcer right calf. EXAM: RIGHT ANKLE - 2 VIEW COMPARISON:  None Available. FINDINGS: No acute bony abnormality. Specifically, no fracture, subluxation, or dislocation. Joint spaces maintained. Vascular calcifications noted. Plantar calcaneal spur. Soft tissues are intact. IMPRESSION: No acute bony abnormality. Electronically Signed   By: Charlett Nose M.D.   On: 07/21/2023 00:03    Procedures Procedures    Medications Ordered in ED Medications  oxyCODONE-acetaminophen (PERCOCET/ROXICET) 5-325 MG per tablet 1 tablet (1 tablet Oral Given 07/20/23 2333)  lactated ringers bolus 500 mL (0 mLs Intravenous Stopped 07/21/23 0035)  vancomycin (VANCOREADY) IVPB 1750 mg/350 mL (0 mg Intravenous Stopped 07/21/23 0230)  HYDROmorphone  (DILAUDID) injection 0.5 mg (0.5 mg Intravenous Given 07/21/23 0014)  piperacillin-tazobactam (ZOSYN) IVPB 3.375 g (0 g Intravenous Stopped 07/21/23 0247)  lactated ringers bolus 500 mL (0 mLs Intravenous Stopped 07/21/23 0222)  iohexol (OMNIPAQUE) 300 MG/ML solution 75 mL (75 mLs Intravenous Contrast Given 07/21/23 0036)    ED Course/ Medical Decision Making/ A&P                                 Medical Decision Making Amount and/or Complexity of Data Reviewed Labs: ordered. Radiology: ordered.  Risk Prescription drug management.   This patient presents to the ED for concern of leg pain, this involves an extensive number of treatment options, and is a complaint that carries with it a high risk of complications and morbidity.  The differential diagnosis includes cellulitis, gangrene, neuropathic pain, ischemia   Co morbidities that complicate the patient evaluation  HLD, PAD, neuropathy   Additional history obtained:  Additional history obtained from N/A External records from outside source obtained and reviewed including EMR   Lab Tests:  I Ordered, and personally interpreted labs.  The pertinent results include: Hemoglobin, no leukocytosis, normal kidney function, normal electrolytes, normal lactate, normal ESR   Imaging Studies ordered:  I ordered imaging studies including x-ray of chest, right ankle, right tib/fib; CT of tib/fib on right I independently visualized and interpreted imaging which showed soft tissue edema without evidence of necrotizing infection. I agree with the radiologist interpretation   Cardiac Monitoring: / EKG:  The patient was maintained on a cardiac monitor.  I personally viewed and interpreted the cardiac monitored which showed an underlying rhythm of: Sinus rhythm  Problem List / ED Course / Critical interventions / Medication management  Patient presenting for worsening pain in area of chronic ulcer.  On arrival, vital signs notable for  hypertension.  Patient currently describes right significant pain to the area which reportedly started this evening.  Percocet was ordered for analgesia.  On inspection of the wound, there is surrounding erythema, tenderness, as well as a central eschar.  Workup was initiated.  On reassessment, patient has continued pain out of proportion.  Dose of Dilaudid was ordered.  Vancomycin and Zosyn were ordered.  Patient's lab work was unremarkable.  No leukocytosis or elevation in inflammatory markers was identified.  On CT scan of area of concern, there was no radiographic evidence of a necrotizing  infection.  Given patient's acute pain, despite outpatient antibiotics, patient to be admitted for observation. I ordered medication including Percocet and Dilaudid for analgesia; IV fluids for hydration; vancomycin and Zosyn for cellulitis Reevaluation of the patient after these medicines showed that the patient improved I have reviewed the patients home medicines and have made adjustments as needed   Social Determinants of Health:  Lives independently        Final Clinical Impression(s) / ED Diagnoses Final diagnoses:  Cellulitis of right lower extremity  Failure of outpatient treatment    Rx / DC Orders ED Discharge Orders     None         Gloris Manchester, MD 07/21/23 [phone]

## 2023-07-21 ENCOUNTER — Observation Stay (HOSPITAL_COMMUNITY)

## 2023-07-21 ENCOUNTER — Encounter (HOSPITAL_COMMUNITY): Payer: Self-pay | Admitting: Internal Medicine

## 2023-07-21 ENCOUNTER — Emergency Department (HOSPITAL_COMMUNITY)

## 2023-07-21 DIAGNOSIS — R600 Localized edema: Secondary | ICD-10-CM | POA: Diagnosis not present

## 2023-07-21 DIAGNOSIS — S81801A Unspecified open wound, right lower leg, initial encounter: Secondary | ICD-10-CM | POA: Diagnosis present

## 2023-07-21 DIAGNOSIS — E785 Hyperlipidemia, unspecified: Secondary | ICD-10-CM

## 2023-07-21 DIAGNOSIS — I739 Peripheral vascular disease, unspecified: Secondary | ICD-10-CM

## 2023-07-21 DIAGNOSIS — M7651 Patellar tendinitis, right knee: Secondary | ICD-10-CM | POA: Diagnosis not present

## 2023-07-21 DIAGNOSIS — M79604 Pain in right leg: Secondary | ICD-10-CM | POA: Diagnosis not present

## 2023-07-21 DIAGNOSIS — M19071 Primary osteoarthritis, right ankle and foot: Secondary | ICD-10-CM | POA: Diagnosis not present

## 2023-07-21 DIAGNOSIS — E871 Hypo-osmolality and hyponatremia: Secondary | ICD-10-CM | POA: Diagnosis not present

## 2023-07-21 DIAGNOSIS — L97219 Non-pressure chronic ulcer of right calf with unspecified severity: Secondary | ICD-10-CM | POA: Diagnosis not present

## 2023-07-21 DIAGNOSIS — S81801S Unspecified open wound, right lower leg, sequela: Secondary | ICD-10-CM

## 2023-07-21 DIAGNOSIS — M7989 Other specified soft tissue disorders: Secondary | ICD-10-CM | POA: Diagnosis not present

## 2023-07-21 LAB — COMPREHENSIVE METABOLIC PANEL
ALT: 50 U/L — ABNORMAL HIGH (ref 0–44)
AST: 41 U/L (ref 15–41)
Albumin: 4 g/dL (ref 3.5–5.0)
Alkaline Phosphatase: 74 U/L (ref 38–126)
Anion gap: 7 (ref 5–15)
BUN: 21 mg/dL (ref 8–23)
CO2: 24 mmol/L (ref 22–32)
Calcium: 8.7 mg/dL — ABNORMAL LOW (ref 8.9–10.3)
Chloride: 103 mmol/L (ref 98–111)
Creatinine, Ser: 0.84 mg/dL (ref 0.61–1.24)
GFR, Estimated: >60 mL/min (ref >60)
Glucose, Bld: 108 mg/dL — ABNORMAL HIGH (ref 70–99)
Potassium: 4.2 mmol/L (ref 3.5–5.1)
Sodium: 134 mmol/L — ABNORMAL LOW (ref 135–145)
Total Bilirubin: 0.3 mg/dL (ref 0.0–1.2)
Total Protein: 6.8 g/dL (ref 6.5–8.1)

## 2023-07-21 LAB — PROTIME-INR
INR: 0.9 (ref 0.8–1.2)
Prothrombin Time: 12.5 s (ref 11.4–15.2)

## 2023-07-21 LAB — LACTIC ACID, PLASMA: Lactic Acid, Venous: 0.6 mmol/L (ref 0.5–1.9)

## 2023-07-21 LAB — MAGNESIUM: Magnesium: 2.2 mg/dL (ref 1.7–2.4)

## 2023-07-21 LAB — SEDIMENTATION RATE: Sed Rate: 5 mm/h (ref 0–16)

## 2023-07-21 LAB — C-REACTIVE PROTEIN: CRP: 0.6 mg/dL (ref <1.0)

## 2023-07-21 MED ORDER — IBUPROFEN 400 MG PO TABS
400.0000 mg | ORAL_TABLET | Freq: Three times a day (TID) | ORAL | Status: DC
  Administered 2023-07-21: 400 mg via ORAL
  Filled 2023-07-21: qty 1

## 2023-07-21 MED ORDER — ACETAMINOPHEN 325 MG PO TABS: 650.0000 mg | ORAL_TABLET | Freq: Four times a day (QID) | ORAL | Status: DC | PRN

## 2023-07-21 MED ORDER — IOHEXOL 300 MG/ML  SOLN
75.0000 mL | Freq: Once | INTRAMUSCULAR | Status: AC | PRN
  Administered 2023-07-21: 75 mL via INTRAVENOUS

## 2023-07-21 MED ORDER — PIPERACILLIN-TAZOBACTAM 3.375 G IVPB 30 MIN
3.3750 g | Freq: Once | INTRAVENOUS | Status: AC
Start: 2023-07-21 — End: 2023-07-21
  Administered 2023-07-21: 3.375 g via INTRAVENOUS
  Filled 2023-07-21: qty 50

## 2023-07-21 MED ORDER — ENOXAPARIN SODIUM 40 MG/0.4ML IJ SOSY
40.0000 mg | PREFILLED_SYRINGE | INTRAMUSCULAR | Status: DC
Start: 2023-07-21 — End: 2023-07-21
  Administered 2023-07-21: 40 mg via SUBCUTANEOUS
  Filled 2023-07-21: qty 0.4

## 2023-07-21 MED ORDER — ACETAMINOPHEN 325 MG PO TABS
650.0000 mg | ORAL_TABLET | Freq: Four times a day (QID) | ORAL | Status: DC
  Administered 2023-07-21: 650 mg via ORAL
  Filled 2023-07-21: qty 2

## 2023-07-21 MED ORDER — ONDANSETRON HCL 4 MG/2ML IJ SOLN: 4.0000 mg | Freq: Four times a day (QID) | INTRAMUSCULAR | Status: DC | PRN

## 2023-07-21 MED ORDER — OXYCODONE HCL 5 MG PO TABS: 5.0000 mg | ORAL_TABLET | Freq: Four times a day (QID) | ORAL | 0 refills | Status: AC | PRN

## 2023-07-21 MED ORDER — ASPIRIN 81 MG PO TBEC
81.0000 mg | DELAYED_RELEASE_TABLET | Freq: Every day | ORAL | Status: DC
  Administered 2023-07-21: 81 mg via ORAL
  Filled 2023-07-21: qty 1

## 2023-07-21 MED ORDER — HYDROMORPHONE HCL 1 MG/ML IJ SOLN
0.5000 mg | Freq: Once | INTRAMUSCULAR | Status: AC
  Administered 2023-07-21: 0.5 mg via INTRAVENOUS
  Filled 2023-07-21: qty 0.5

## 2023-07-21 MED ORDER — LIDOCAINE HCL URETHRAL/MUCOSAL 2 % EX GEL: 1.0000 | Freq: Three times a day (TID) | CUTANEOUS | Status: DC | PRN

## 2023-07-21 MED ORDER — ADULT MULTIVITAMIN W/MINERALS CH
1.0000 | ORAL_TABLET | Freq: Every day | ORAL | Status: DC
  Administered 2023-07-21: 1 via ORAL
  Filled 2023-07-21: qty 1

## 2023-07-21 MED ORDER — ONDANSETRON HCL 4 MG PO TABS: 4.0000 mg | ORAL_TABLET | Freq: Four times a day (QID) | ORAL | Status: DC | PRN

## 2023-07-21 MED ORDER — LACTATED RINGERS IV BOLUS (SEPSIS)
500.0000 mL | Freq: Once | INTRAVENOUS | Status: AC
  Administered 2023-07-21: 500 mL via INTRAVENOUS

## 2023-07-21 MED ORDER — ATORVASTATIN CALCIUM 10 MG PO TABS
5.0000 mg | ORAL_TABLET | Freq: Every day | ORAL | Status: DC
Start: 2023-07-21 — End: 2023-07-21

## 2023-07-21 MED ORDER — LATANOPROST 0.005 % OP SOLN: 1.0000 [drp] | Freq: Every day | OPHTHALMIC | Status: DC

## 2023-07-21 MED ORDER — PANTOPRAZOLE SODIUM 40 MG PO TBEC
40.0000 mg | DELAYED_RELEASE_TABLET | Freq: Every day | ORAL | Status: DC
  Administered 2023-07-21: 40 mg via ORAL
  Filled 2023-07-21: qty 1

## 2023-07-21 MED ORDER — HYDROMORPHONE HCL 1 MG/ML IJ SOLN
0.5000 mg | INTRAMUSCULAR | Status: DC | PRN
  Administered 2023-07-21: 0.5 mg via INTRAVENOUS
  Filled 2023-07-21: qty 0.5

## 2023-07-21 MED ORDER — DORZOLAMIDE HCL-TIMOLOL MAL 2-0.5 % OP SOLN: 1.0000 [drp] | Freq: Two times a day (BID) | OPHTHALMIC | Status: DC

## 2023-07-21 MED ORDER — OXYCODONE HCL 5 MG PO TABS
10.0000 mg | ORAL_TABLET | Freq: Once | ORAL | Status: AC
  Administered 2023-07-21: 10 mg via ORAL
  Filled 2023-07-21: qty 2

## 2023-07-21 MED ORDER — DULOXETINE HCL 30 MG PO CPEP
30.0000 mg | ORAL_CAPSULE | Freq: Every day | ORAL | Status: DC
  Administered 2023-07-21: 30 mg via ORAL
  Filled 2023-07-21: qty 1

## 2023-07-21 MED ORDER — LIDOCAINE 4 % EX CREA
TOPICAL_CREAM | Freq: Three times a day (TID) | CUTANEOUS | Status: DC
  Filled 2023-07-21: qty 5

## 2023-07-21 MED ORDER — DULOXETINE HCL 30 MG PO CPEP: 30.0000 mg | ORAL_CAPSULE | Freq: Every day | ORAL | 0 refills | Status: AC

## 2023-07-21 MED ORDER — LIDOCAINE HCL 4 % EX SOLN: Freq: Three times a day (TID) | CUTANEOUS | Status: DC

## 2023-07-21 MED ORDER — ACETAMINOPHEN 650 MG RE SUPP: 650.0000 mg | Freq: Four times a day (QID) | RECTAL | Status: DC | PRN

## 2023-07-21 NOTE — ED Notes (Signed)
 Patient transported to CT

## 2023-07-21 NOTE — Progress Notes (Addendum)
 PROGRESS NOTE    Roberto Rosales  WUJ:[id number] DOB: Feb 16, 1946 DOA: 07/20/2023 PCP: [facility]  Subjective: Pt seen and examined. Pt sees wound care center in [facility]. Also has vascular surgeon in [place].  Has had ulcer on his right leg for several months.  No signs of infection. Sed rate is normal. No drainage from wound.  Agreeable to DC to home with 7 day supply of oxycodone. Pt warned that oxycodone can be habit forming and to use sparingly.    Hospital Course: HPI:  Roberto Rosales is a 78 y.o. male with medical history significant of dyslipidemia, peripheral vascular disease and right leg wound who presented with worsening right leg pain.  Patient has a chronic right leg wound since November 2024. Follows with the wound care center at [facility]. He has been under different antibiotic regimens and different wound care protocols.  His wound started as a blister and evolved into a large deep ulcer.  He has completed yesterday a 10 days of Cephalexin with good toleration and has been doing daily wound care.  Overall his wound has been improving and healing, but today he developed significant right leg pain. It has been severe in intensity, worse to touch, with no improving factors and with no radiation.  No frank fever of chills. He has been taking gabapentin for pain, with no significant results besides making him sleepy.   Significant Events: Admitted 07/20/2023 open right leg wound   Significant Labs: WBC 5.7, HgB 12.5, plt 168 Na 134, K 4.2, CO2 of 24, BUN 21, scr 0.84, glu 108 ESR 5 Lactic acid 0.6  Significant Imaging Studies: CXR No active disease.  Right ankle/tib/fib No acute bony abnormality.  CT right tib/fib Diffuse subcutaneus soft tissue edema of the leg. No subcutaneus soft tissue emphysema. No organized fluid collection. Please note a necrotizing fasciitis cannot be excluded as this is a clinical diagnosis. 2.  No acute displaced  fracture or dislocation  Antibiotic Therapy: Anti-infectives (From admission, onward)    Start     Dose/Rate Route Frequency Ordered Stop   07/21/23 0030  piperacillin-tazobactam (ZOSYN) IVPB 3.375 g        3.375 g 100 mL/hr over 30 Minutes Intravenous  Once 07/21/23 0022 07/21/23 0247   07/21/23 0000  vancomycin (VANCOREADY) IVPB 1750 mg/350 mL        1,750 mg 175 mL/hr over 120 Minutes Intravenous  Once 07/20/23 2349 07/21/23 0230       Procedures:   Consultants:     Assessment and Plan: * Leg wound, right On admission. Chronic right leg wound seems to be improving, there are no local or systemic signs of infection.   Hold on further systemic antibiotic for now.  Patient has received one dose of 0,5 mg hydromorphone with improvement in his pain.  Plan for better pain control with acetaminophen, ibuprofen and for severe pain hydromorphone (later can be changed to oxycodone) Discontinue gabapentin, note he has not been taking tramadol (listed in his medication list). Further work up with right leg doppler US, note that he has one study from 04/2023 negative for deep vein thrombosis.  Pt and Ot.   07-21-2023 stable for DC. Doubt he has a Dvt. LE U/S negative for DVT. Rx for oxycodone and cymbalta for neuropathic pain.       Hyponatremia On admission. Renal function with serum cr at 0,84 with Na at 134.  Will allow patient regular diet, will hold on  IV fluids for now.  Follow up renal function and electrolytes in am.   07-21-2023 Na of 134. Clinically insignificant.  Dyslipidemia Continue with statin therapy.  PVD (peripheral vascular disease) (HCC) On admission.  Continue with aspirin and atorvastatin.  Patient has a pedal pulse palpable, the distal foot is warm and has no discoloration.   07-21-2023 f/u [facility] vascular surgery.Rx for oxycodone and cymbalta for neuropathic pain.   DVT prophylaxis: enoxaparin (LOVENOX) injection 40 mg Start: 07/21/23 1000 SCDs  Start: 07/21/23 0441    Code Status: Full Code Family Communication: no family at bedside Disposition Plan: return home Reason for continuing need for hospitalization: medically stable for DC.  Objective: Vitals:   07/21/23 0416 07/21/23 0417 07/21/23 0443 07/21/23 0846  BP:   (!) 151/66 (!) 170/64  Pulse: (!) 46 (!) 46 (!) 50 (!) 54  Resp: 10 10 18 20   Temp:   (!) 97.5 F (36.4 C) (!) 97.5 F (36.4 C)  TempSrc:   Oral Oral  SpO2: 99% 98% 99% 100%  Weight:      Height:        Intake/Output Summary (Last 24 hours) at 07/21/2023 1032 Last data filed at 07/21/2023 0852 Gross per 24 hour  Intake 1491.84 ml  Output 675 ml  Net 816.84 ml   Filed Weights   07/20/23 2333  Weight: 87.9 kg    Examination:  Physical Exam Vitals and nursing note reviewed.  Constitutional:      General: He is not in acute distress.    Appearance: He is normal weight. He is not toxic-appearing or diaphoretic.  HENT:     Head: Normocephalic and atraumatic.     Nose: Nose normal.  Pulmonary:     Effort: Pulmonary effort is normal.  Abdominal:     General: Abdomen is flat. There is no distension.  Skin:    General: Skin is warm and dry.     Comments: See picture of right lower leg wound  Neurological:     Mental Status: He is alert and oriented to person, place, and time.        Data Reviewed: I have personally reviewed following labs and imaging studies  CBC: Recent Labs  Lab 07/20/23 2328  WBC 5.7  NEUTROABS 3.5  HGB 12.5*  HCT 36.3*  MCV 85.8  PLT 168   Basic Metabolic Panel: Recent Labs  Lab 07/20/23 2328  NA 134*  K 4.2  CL 103  CO2 24  GLUCOSE 108*  BUN 21  CREATININE 0.84  CALCIUM 8.7*  MG 2.2   GFR: Estimated Creatinine Clearance: 80.8 mL/min (by C-G formula based on SCr of 0.84 mg/dL). Liver Function Tests: Recent Labs  Lab 07/20/23 2328  AST 41  ALT 50*  ALKPHOS 74  BILITOT 0.3  PROT 6.8  ALBUMIN 4.0   Coagulation Profile: Recent Labs  Lab  07/20/23 2328  INR 0.9   Sepsis Labs: Recent Labs  Lab 07/20/23 2328  LATICACIDVEN 0.6    Inflammatory Markers: Lab Results  Component Value Date/Time   ESRSEDRATE 5 07/20/2023 11:28 PM     Recent Results (from the past 240 hours)  Blood Culture (routine x 2)     Status: None (Preliminary result)   Collection Time: 07/20/23 11:28 PM   Specimen: Right Antecubital; Blood  Result Value Ref Range Status   Specimen Description RIGHT ANTECUBITAL  Final   Special Requests   Final    BOTTLES DRAWN AEROBIC AND ANAEROBIC Blood Culture adequate volume  Culture   Final    NO GROWTH < 12 HOURS Performed at [facility], [address], [place], [place] [address]    Report Status PENDING  Incomplete  Blood Culture (routine x 2)     Status: None (Preliminary result)   Collection Time: 07/21/23 12:07 AM   Specimen: BLOOD LEFT FOREARM  Result Value Ref Range Status   Specimen Description BLOOD LEFT FOREARM  Final   Special Requests   Final    BOTTLES DRAWN AEROBIC AND ANAEROBIC Blood Culture adequate volume   Culture   Final    NO GROWTH < 12 HOURS Performed at [facility], [address], [place], [place] [address]    Report Status PENDING  Incomplete     Radiology Studies: US Venous Img Lower Unilateral Right (DVT) Result Date: 07/21/2023 CLINICAL DATA:  Right lower extremity pain and edema. Nonhealing venous ulcer of the right calf. EXAM: RIGHT LOWER EXTREMITY VENOUS DOPPLER ULTRASOUND TECHNIQUE: Gray-scale sonography with graded compression, as well as color Doppler and duplex ultrasound were performed to evaluate the lower extremity deep venous systems from the level of the common femoral vein and including the common femoral, femoral, profunda femoral, popliteal and calf veins including the posterior tibial, peroneal and gastrocnemius veins when visible. The superficial great saphenous vein was also interrogated. Spectral Doppler was utilized to evaluate flow at rest and with  distal augmentation maneuvers in the common femoral, femoral and popliteal veins. COMPARISON:  None Available. FINDINGS: Contralateral Common Femoral Vein: Respiratory phasicity is normal and symmetric with the symptomatic side. No evidence of thrombus. Normal compressibility. Common Femoral Vein: No evidence of thrombus. Normal compressibility, respiratory phasicity and response to augmentation. Saphenofemoral Junction: No evidence of thrombus. Normal compressibility and flow on color Doppler imaging. Profunda Femoral Vein: No evidence of thrombus. Normal compressibility and flow on color Doppler imaging. Femoral Vein: No evidence of thrombus. Normal compressibility, respiratory phasicity and response to augmentation. Popliteal Vein: No evidence of thrombus. Normal compressibility, respiratory phasicity and response to augmentation. Calf Veins: No evidence of thrombus. Normal compressibility and flow on color Doppler imaging. Superficial Great Saphenous Vein: No evidence of thrombus. Normal compressibility. Venous Reflux:  None. Other Findings: No evidence of superficial thrombophlebitis or abnormal fluid collection. IMPRESSION: No evidence of right lower extremity deep venous thrombosis. Electronically Signed   By: Irish Lack M.D.   On: 07/21/2023 10:19   CT TIBIA FIBULA RIGHT W CONTRAST Result Date: 07/21/2023 CLINICAL DATA:  Soft tissue infection suspected, lower leg, xray done EXAM: CT OF THE LOWER RIGHT EXTREMITY WITH CONTRAST TECHNIQUE: Multidetector CT imaging of the lower right extremity was performed according to the standard protocol following intravenous contrast administration. RADIATION DOSE REDUCTION: This exam was performed according to the departmental dose-optimization program which includes automated exposure control, adjustment of the mA and/or kV according to patient size and/or use of iterative reconstruction technique. CONTRAST:  75mL OMNIPAQUE IOHEXOL 300 MG/ML  SOLN COMPARISON:  CT  right tibia fibula 07/20/2023 FINDINGS: Bones/Joint/Cartilage No evidence of fracture, dislocation, or joint effusion. Patellar enthesopathy. Degenerative changes of the infra syndesmotic distal fibula. No evidence of severe arthropathy. No aggressive appearing focal bone abnormality. Ligaments Suboptimally assessed by CT. Muscles and Tendons Grossly unremarkable. Soft tissues Diffuse subcutaneus soft tissue edema of the leg. No subcutaneus soft tissue emphysema. No organized fluid collection. Vascular calcifications. IMPRESSION: 1. Diffuse subcutaneus soft tissue edema of the leg. No subcutaneus soft tissue emphysema. No organized fluid collection. Please note a necrotizing fasciitis cannot be excluded  as this is a clinical diagnosis. 2.  No acute displaced fracture or dislocation. Electronically Signed   By: Tish Frederickson M.D.   On: 07/21/2023 01:10   DG Chest Port 1 View Result Date: 07/21/2023 CLINICAL DATA:  Questionable sepsis - evaluate for abnormality EXAM: PORTABLE CHEST 1 VIEW COMPARISON:  06/22/2021 FINDINGS: Heart and mediastinal contours are within normal limits. No focal opacities or effusions. No acute bony abnormality. IMPRESSION: No active disease. Electronically Signed   By: Charlett Nose M.D.   On: 07/21/2023 00:06   DG Tibia/Fibula Right Result Date: 07/21/2023 CLINICAL DATA:  Venous ulcer right calf EXAM: RIGHT TIBIA AND FIBULA - 2 VIEW COMPARISON:  None Available. FINDINGS: There is no evidence of fracture or other focal bone lesions. Soft tissues are unremarkable. No evidence of osteomyelitis. IMPRESSION: No acute bony abnormality. Electronically Signed   By: Charlett Nose M.D.   On: 07/21/2023 00:05   DG Ankle 2 Views Right Result Date: 07/21/2023 CLINICAL DATA:  Questionable sepsis - evaluate for abnormality. Ulcer right calf. EXAM: RIGHT ANKLE - 2 VIEW COMPARISON:  None Available. FINDINGS: No acute bony abnormality. Specifically, no fracture, subluxation, or dislocation. Joint  spaces maintained. Vascular calcifications noted. Plantar calcaneal spur. Soft tissues are intact. IMPRESSION: No acute bony abnormality. Electronically Signed   By: Charlett Nose M.D.   On: 07/21/2023 00:03    Scheduled Meds:  acetaminophen  650 mg Oral Q6H   aspirin EC  81 mg Oral Q breakfast   atorvastatin  5 mg Oral QHS   dorzolamide-timolol  1 drop Left Eye BID   DULoxetine  30 mg Oral Daily   enoxaparin (LOVENOX) injection  40 mg Subcutaneous Q24H   ibuprofen  400 mg Oral TID   latanoprost  1 drop Both Eyes QHS   lidocaine   Topical TID   multivitamin with minerals  1 tablet Oral Daily   pantoprazole  40 mg Oral Daily   Continuous Infusions:   LOS: 0 days   Time spent: 40 minutes  Carollee Herter, DO  [facility]  07/21/2023, 10:32 AM

## 2023-07-21 NOTE — Subjective & Objective (Signed)
 Pt seen and examined. Pt sees wound care center in [facility]. Also has vascular surgeon in [place].  Has had ulcer on his right leg for several months.  No signs of infection. Sed rate is normal. No drainage from wound.  Agreeable to DC to home with 7 day supply of oxycodone. Pt warned that oxycodone can be habit forming and to use sparingly.

## 2023-07-21 NOTE — Assessment & Plan Note (Addendum)
 On admission. Chronic right leg wound seems to be improving, there are no local or systemic signs of infection.   Hold on further systemic antibiotic for now.  Patient has received one dose of 0,5 mg hydromorphone with improvement in his pain.  Plan for better pain control with acetaminophen, ibuprofen and for severe pain hydromorphone (later can be changed to oxycodone) Discontinue gabapentin, note he has not been taking tramadol (listed in his medication list). Further work up with right leg doppler US, note that he has one study from 04/2023 negative for deep vein thrombosis.  Pt and Ot.   07-21-2023 stable for DC. Doubt he has a Dvt. LE U/S negative for DVT. Rx for oxycodone and cymbalta for neuropathic pain.

## 2023-07-21 NOTE — Plan of Care (Signed)

## 2023-07-21 NOTE — Discharge Summary (Addendum)
 [facility] Phys[facility] Discharge Summary   Patient name: Roberto Rosales  Admit date:     07/20/2023  Discharge date: 07/21/2023  Attending Physician: Coralie Keens [[id number]]  Discharge Physician: Carollee Herter   PCP: [facility]  Admitted From: Home  Disposition:  Home  Recommendations for Outpatient Follow-up:  Follow up with PCP in 1-2 weeks Follow up with [facility] vascular surgery and wound care Please follow up on the following pending results: Home Health:No Equipment/Devices: None  Discharge Condition:Stable CODE STATUS:FULL Diet recommendation: Regular Fluid Restriction: None  Hospital Summary: HPI:  [name] is a 78 y.o. male with medical history significant of dyslipidemia, peripheral vascular disease and right leg wound who presented with worsening right leg pain.  Patient has a chronic right leg wound since November 2024. Follows with the wound care center at [facility]. He has been under different antibiotic regimens and different wound care protocols.  His wound started as a blister and evolved into a large deep ulcer.  He has completed yesterday a 10 days of Cephalexin with good toleration and has been doing daily wound care.  Overall his wound has been improving and healing, but today he developed significant right leg pain. It has been severe in intensity, worse to touch, with no improving factors and with no radiation.  No frank fever of chills. He has been taking gabapentin for pain, with no significant results besides making him sleepy.   Significant Events: Admitted 07/20/2023 open right leg wound   Significant Labs: WBC 5.7, HgB 12.5, plt 168 Na 134, K 4.2, CO2 of 24, BUN 21, scr 0.84, glu 108 ESR 5 Lactic acid 0.6  Significant Imaging Studies: CXR No active disease.  Right ankle/tib/fib No acute bony abnormality.  CT right tib/fib Diffuse subcutaneus soft tissue edema of the leg. No subcutaneus soft tissue  emphysema. No organized fluid collection. Please note a necrotizing fasciitis cannot be excluded as this is a clinical diagnosis. 2.  No acute displaced fracture or dislocation  Antibiotic Therapy: Anti-infectives (From admission, onward)    Start     Dose/Rate Route Frequency Ordered Stop   07/21/23 0030  piperacillin-tazobactam (ZOSYN) IVPB 3.375 g        3.375 g 100 mL/hr over 30 Minutes Intravenous  Once 07/21/23 0022 07/21/23 0247   07/21/23 0000  vancomycin (VANCOREADY) IVPB 1750 mg/350 mL        1,750 mg 175 mL/hr over 120 Minutes Intravenous  Once 07/20/23 2349 07/21/23 0230       Procedures:   Consultants:    Hospital Course by Problem: * Leg wound, right On admission. Chronic right leg wound seems to be improving, there are no local or systemic signs of infection.   Hold on further systemic antibiotic for now.  Patient has received one dose of 0,5 mg hydromorphone with improvement in his pain.  Plan for better pain control with acetaminophen, ibuprofen and for severe pain hydromorphone (later can be changed to oxycodone) Discontinue gabapentin, note he has not been taking tramadol (listed in his medication list). Further work up with right leg doppler US, note that he has one study from 04/2023 negative for deep vein thrombosis.  Pt and Ot.   07-21-2023 stable for DC. Doubt he has a Dvt. LE U/S negative for DVT. Rx for oxycodone and cymbalta for neuropathic pain.       Hyponatremia On admission. Renal function with serum cr at 0,84 with Na at 134.  Will allow  patient regular diet, will hold on IV fluids for now.  Follow up renal function and electrolytes in am.   07-21-2023 Na of 134. Clinically insignificant.  Dyslipidemia Continue with statin therapy.  PVD (peripheral vascular disease) (HCC) On admission.  Continue with aspirin and atorvastatin.  Patient has a pedal pulse palpable, the distal foot is warm and has no discoloration.   07-21-2023 f/u [facility]  vascular surgery.Rx for oxycodone and cymbalta for neuropathic pain.    Discharge Diagnoses:  Principal Problem:   Leg wound, right Active Problems:   Hyponatremia   Dyslipidemia   PVD (peripheral vascular disease) [facility])   Discharge Instructions  Discharge Instructions     Call MD for:  difficulty breathing, headache or visual disturbances   Complete by: As directed    Call MD for:  extreme fatigue   Complete by: As directed    Call MD for:  hives   Complete by: As directed    Call MD for:  persistant dizziness or light-headedness   Complete by: As directed    Call MD for:  persistant nausea and vomiting   Complete by: As directed    Call MD for:  redness, tenderness, or signs of infection (pain, swelling, redness, odor or green/yellow discharge around incision site)   Complete by: As directed    Call MD for:  temperature >100.4   Complete by: As directed    Diet - low sodium heart healthy   Complete by: As directed    Discharge instructions   Complete by: As directed    1. Follow up with your primary care provider in 1-2 weeks following discharge from hospital. 2. Follow up with wound care center in [facility] as scheduled. 3. Do not drive or operate heavy machinery while taking pain pills. 4. Oxycodone(pain pills) can be addictive. Use sparingly.   Increase activity slowly   Complete by: As directed       Allergies as of 07/21/2023       Reactions   Codeine Nausea And Vomiting        Medication List     STOP taking these medications    gabapentin 300 MG capsule Commonly known as: NEURONTIN   traMADol 50 MG tablet Commonly known as: ULTRAM       TAKE these medications    acetaminophen 325 MG tablet Commonly known as: TYLENOL Take 2 tablets (650 mg total) by mouth every 6 (six) hours as needed for mild pain (pain score 1-3) (or Fever >/= 101).   aspirin EC 81 MG tablet Take 1 tablet (81 mg total) by mouth daily with breakfast. Swallow whole.    atorvastatin 10 MG tablet Commonly known as: LIPITOR Take 5 mg by mouth at bedtime.   dorzolamide-timolol 2-0.5 % ophthalmic solution Commonly known as: COSOPT Place 1 drop into the left eye 2 (two) times daily.   DULoxetine 30 MG capsule Commonly known as: CYMBALTA Take 1 capsule (30 mg total) by mouth daily. Start taking on: July 22, 2023   hydrocortisone 2.5 % cream Apply topically 3 (three) times daily.   latanoprost 0.005 % ophthalmic solution Commonly known as: XALATAN Place 1 drop into both eyes at bedtime.   multivitamin with minerals Tabs tablet Take 1 tablet by mouth daily.   mupirocin ointment 2 % Commonly known as: BACTROBAN SMARTSIG:0.5 Gram(s) Topical 3 Times Daily   oxyCODONE 5 MG immediate release tablet Commonly known as: Roxicodone Take 1 tablet (5 mg total) by mouth every 6 (six)  hours as needed for up to 8 days for moderate pain (pain score 4-6).        Allergies  Allergen Reactions   Codeine Nausea And Vomiting    Discharge Exam: Vitals:   07/21/23 0443 07/21/23 0846  BP: (!) 151/66 (!) 170/64  Pulse: (!) 50 (!) 54  Resp: 18 20  Temp: (!) 97.5 F (36.4 C) (!) 97.5 F (36.4 C)  SpO2: 99% 100%    Physical Exam Vitals and nursing note reviewed.  Constitutional:      General: He is not in acute distress.    Appearance: He is normal weight. He is not toxic-appearing or diaphoretic.  HENT:     Head: Normocephalic and atraumatic.     Nose: Nose normal.  Pulmonary:     Effort: Pulmonary effort is normal.  Abdominal:     General: Abdomen is flat. There is no distension.  Skin:    General: Skin is warm and dry.     Comments: See picture of right lower leg wound  Neurological:     Mental Status: He is alert and oriented to person, place, and time.        The results of significant diagnostics from this hospitalization (including imaging, microbiology, ancillary and laboratory) are listed below for reference.     Microbiology: Recent Results (from the past 240 hours)  Blood Culture (routine x 2)     Status: None (Preliminary result)   Collection Time: 07/20/23 11:28 PM   Specimen: Right Antecubital; Blood  Result Value Ref Range Status   Specimen Description RIGHT ANTECUBITAL  Final   Special Requests   Final    BOTTLES DRAWN AEROBIC AND ANAEROBIC Blood Culture adequate volume   Culture   Final    NO GROWTH < 12 HOURS Performed at [facility], [address] [place], [place] [address]    Report Status PENDING  Incomplete  Blood Culture (routine x 2)     Status: None (Preliminary result)   Collection Time: 07/21/23 12:07 AM   Specimen: BLOOD LEFT FOREARM  Result Value Ref Range Status   Specimen Description BLOOD LEFT FOREARM  Final   Special Requests   Final    BOTTLES DRAWN AEROBIC AND ANAEROBIC Blood Culture adequate volume   Culture   Final    NO GROWTH < 12 HOURS Performed at [facility], [address], [place], [place] [address]    Report Status PENDING  Incomplete     Labs: Basic Metabolic Panel: Recent Labs  Lab 07/20/23 2328  NA 134*  K 4.2  CL 103  CO2 24  GLUCOSE 108*  BUN 21  CREATININE 0.84  CALCIUM 8.7*  MG 2.2   Liver Function Tests: Recent Labs  Lab 07/20/23 2328  AST 41  ALT 50*  ALKPHOS 74  BILITOT 0.3  PROT 6.8  ALBUMIN 4.0   CBC: Recent Labs  Lab 07/20/23 2328  WBC 5.7  NEUTROABS 3.5  HGB 12.5*  HCT 36.3*  MCV 85.8  PLT 168   Sepsis Labs Recent Labs  Lab 07/20/23 2328  WBC 5.7   Inflammatory Markers: Lab Results  Component Value Date/Time   ESRSEDRATE 5 07/20/2023 11:28 PM    Procedures/Studies: US Venous Img Lower Unilateral Right (DVT) Result Date: 07/21/2023 CLINICAL DATA:  Right lower extremity pain and edema. Nonhealing venous ulcer of the right calf. EXAM: RIGHT LOWER EXTREMITY VENOUS DOPPLER ULTRASOUND TECHNIQUE: Gray-scale sonography with graded compression, as well as color Doppler and duplex ultrasound were  performed to evaluate the lower extremity deep venous systems from the level of the common femoral vein and including the common femoral, femoral, profunda femoral, popliteal and calf veins including the posterior tibial, peroneal and gastrocnemius veins when visible. The superficial great saphenous vein was also interrogated. Spectral Doppler was utilized to evaluate flow at rest and with distal augmentation maneuvers in the common femoral, femoral and popliteal veins. COMPARISON:  None Available. FINDINGS: Contralateral Common Femoral Vein: Respiratory phasicity is normal and symmetric with the symptomatic side. No evidence of thrombus. Normal compressibility. Common Femoral Vein: No evidence of thrombus. Normal compressibility, respiratory phasicity and response to augmentation. Saphenofemoral Junction: No evidence of thrombus. Normal compressibility and flow on color Doppler imaging. Profunda Femoral Vein: No evidence of thrombus. Normal compressibility and flow on color Doppler imaging. Femoral Vein: No evidence of thrombus. Normal compressibility, respiratory phasicity and response to augmentation. Popliteal Vein: No evidence of thrombus. Normal compressibility, respiratory phasicity and response to augmentation. Calf Veins: No evidence of thrombus. Normal compressibility and flow on color Doppler imaging. Superficial Great Saphenous Vein: No evidence of thrombus. Normal compressibility. Venous Reflux:  None. Other Findings: No evidence of superficial thrombophlebitis or abnormal fluid collection. IMPRESSION: No evidence of right lower extremity deep venous thrombosis. Electronically Signed   By: Irish Lack M.D.   On: 07/21/2023 10:19   CT TIBIA FIBULA RIGHT W CONTRAST Result Date: 07/21/2023 CLINICAL DATA:  Soft tissue infection suspected, lower leg, xray done EXAM: CT OF THE LOWER RIGHT EXTREMITY WITH CONTRAST TECHNIQUE: Multidetector CT imaging of the lower right extremity was performed according to  the standard protocol following intravenous contrast administration. RADIATION DOSE REDUCTION: This exam was performed according to the departmental dose-optimization program which includes automated exposure control, adjustment of the mA and/or kV according to patient size and/or use of iterative reconstruction technique. CONTRAST:  75mL OMNIPAQUE IOHEXOL 300 MG/ML  SOLN COMPARISON:  CT right tibia fibula 07/20/2023 FINDINGS: Bones/Joint/Cartilage No evidence of fracture, dislocation, or joint effusion. Patellar enthesopathy. Degenerative changes of the infra syndesmotic distal fibula. No evidence of severe arthropathy. No aggressive appearing focal bone abnormality. Ligaments Suboptimally assessed by CT. Muscles and Tendons Grossly unremarkable. Soft tissues Diffuse subcutaneus soft tissue edema of the leg. No subcutaneus soft tissue emphysema. No organized fluid collection. Vascular calcifications. IMPRESSION: 1. Diffuse subcutaneus soft tissue edema of the leg. No subcutaneus soft tissue emphysema. No organized fluid collection. Please note a necrotizing fasciitis cannot be excluded as this is a clinical diagnosis. 2.  No acute displaced fracture or dislocation. Electronically Signed   By: Tish Frederickson M.D.   On: 07/21/2023 01:10   DG Chest Port 1 View Result Date: 07/21/2023 CLINICAL DATA:  Questionable sepsis - evaluate for abnormality EXAM: PORTABLE CHEST 1 VIEW COMPARISON:  06/22/2021 FINDINGS: Heart and mediastinal contours are within normal limits. No focal opacities or effusions. No acute bony abnormality. IMPRESSION: No active disease. Electronically Signed   By: Charlett Nose M.D.   On: 07/21/2023 00:06   DG Tibia/Fibula Right Result Date: 07/21/2023 CLINICAL DATA:  Venous ulcer right calf EXAM: RIGHT TIBIA AND FIBULA - 2 VIEW COMPARISON:  None Available. FINDINGS: There is no evidence of fracture or other focal bone lesions. Soft tissues are unremarkable. No evidence of osteomyelitis.  IMPRESSION: No acute bony abnormality. Electronically Signed   By: Charlett Nose M.D.   On: 07/21/2023 00:05   DG Ankle 2 Views Right Result Date: 07/21/2023 CLINICAL DATA:  Questionable sepsis - evaluate for abnormality. Ulcer right calf.  EXAM: RIGHT ANKLE - 2 VIEW COMPARISON:  None Available. FINDINGS: No acute bony abnormality. Specifically, no fracture, subluxation, or dislocation. Joint spaces maintained. Vascular calcifications noted. Plantar calcaneal spur. Soft tissues are intact. IMPRESSION: No acute bony abnormality. Electronically Signed   By: Charlett Nose M.D.   On: 07/21/2023 00:03    Time coordinating discharge: 45 mins  SIGNED:  Carollee Herter, DO [facility] 07/21/23, 10:32 AM

## 2023-07-21 NOTE — Progress Notes (Signed)
 Two person skin assessment completed by this RN and Derrick Ravel, RN. Patient has an open wound on his right leg which was the reason for admission. All other skin is clean, dry, and intact and free from any wounds. Pictures of right leg wound taken and uploaded into his chart.

## 2023-07-21 NOTE — H&P (Addendum)
 History and Physical    Patient: Roberto Rosales:[id number] DOB: 07-16-1945 DOA: 07/20/2023 DOS: the patient was seen and examined on 07/21/2023 PCP: [facility]  Patient coming from: Home  Chief Complaint:  Chief Complaint  Patient presents with   Leg Pain   HPI: Roberto Rosales is a 78 y.o. male with medical history significant of dyslipidemia, peripheral vascular disease and right leg wound who presented with worsening right leg pain.  Patient has a chronic right leg wound since November 2024. Follows with the wound care center at [facility]. He has been under different antibiotic regimens and different wound care protocols.  His wound started as a blister and evolved into a large deep ulcer.  He has completed yesterday a 10 days of Cephalexin with good toleration and has been doing daily wound care.  Overall his wound has been improving and healing, but today he developed significant right leg pain. It has been severe in intensity, worse to touch, with no improving factors and with no radiation.  No frank fever of chills. He has been taking gabapentin for pain, with no significant results besides making him sleepy.   Review of Systems: As mentioned in the history of present illness. All other systems reviewed and are negative. Past Medical History:  Diagnosis Date   Epistaxis    Glaucoma    High cholesterol    Neuropathy    Peripheral arterial disease [facility])    Past Surgical History:  Procedure Laterality Date   BACK SURGERY     CATARACT EXTRACTION Bilateral 2007   COLONOSCOPY  11/04/2007   Dr. Cleotis Nipper, normal exam.  Recommended repeat colonoscopy in 10 years.   COLONOSCOPY WITH PROPOFOL N/A 02/23/2020   Procedure: COLONOSCOPY WITH PROPOFOL;  Surgeon: Lanelle Bal, DO;  Location: [facility];  Service: Endoscopy;  Laterality: N/A;  7:30am   NASAL ENDOSCOPY WITH EPISTAXIS CONTROL Right 10/11/2017   Procedure: NASAL CAUTERY AND  BIOPSY OF RIGHT NASAL  GRANULATION TISSUE;  Surgeon: Newman Pies, MD;  Location: [facility] OR;  Service: ENT;  Laterality: Right;   NASAL ENDOSCOPY WITH EPISTAXIS CONTROL Right 10/22/2017   Procedure: NASAL ENDOSCOPY WITH RIGHT EPISTAXIS CONTROL;  Surgeon: Newman Pies, MD;  Location: [facility];  Service: ENT;  Laterality: Right;   POLYPECTOMY  02/23/2020   Procedure: POLYPECTOMY;  Surgeon: Lanelle Bal, DO;  Location: [facility];  Service: Endoscopy;;   TONSILLECTOMY  1950   Social History:  reports that he has never smoked. He has never used smokeless tobacco. He reports that he does not drink alcohol and does not use drugs.  Allergies  Allergen Reactions   Codeine Nausea And Vomiting    Family History  Problem Relation Age of Onset   Cerebral aneurysm Mother    Heart disease Father    Stroke Father    Neuropathy Neg Hx    Colon cancer Neg Hx     Prior to Admission medications   Medication Sig Start Date End Date Taking? Authorizing Provider  acetaminophen (TYLENOL) 325 MG tablet Take 2 tablets (650 mg total) by mouth every 6 (six) hours as needed for mild pain (pain score 1-3) (or Fever >/= 101). 04/07/23   Shon Hale, MD  aspirin EC 81 MG tablet Take 1 tablet (81 mg total) by mouth daily with breakfast. Swallow whole. 04/08/23   Shon Hale, MD  atorvastatin (LIPITOR) 10 MG tablet Take 5 mg by mouth at bedtime.  08/16/16  [provider]  dorzolamide-timolol (COSOPT) 22.3-6.8 MG/ML ophthalmic solution Place 1 drop into the left eye 2 (two) times daily.    [provider]  gabapentin (NEURONTIN) 300 MG capsule Take 300 mg by mouth at bedtime. 05/07/23   [provider]  hydrocortisone 2.5 % cream Apply topically 3 (three) times daily. 05/16/23   [provider]  latanoprost (XALATAN) 0.005 % ophthalmic solution Place 1 drop into both eyes at bedtime.  07/18/16   [provider]  Multiple Vitamin (MULTIVITAMIN WITH MINERALS) TABS tablet Take 1  tablet by mouth daily.    [provider]  mupirocin ointment (BACTROBAN) 2 % SMARTSIG:0.5 Gram(s) Topical 3 Times Daily 04/19/23   [provider]  traMADol (ULTRAM) 50 MG tablet Take 1 tablet (50 mg total) by mouth every 12 (twelve) hours as needed. 04/07/23   Shon Hale, MD    Physical Exam: Vitals:   07/21/23 0229 07/21/23 0230 07/21/23 0231 07/21/23 0245  BP:  (!) 149/68  (!) 146/67  Pulse:      Resp: 13  10 10   Temp:      TempSrc:      SpO2:      Weight:      Height:       Neurology awake and alert ENT with no pallor Cardiovascular with S1 and S2 present and regular with no gallops or rubs Respiratory with no rales or wheezing, no rhonchi Abdomen with no distention  Trace right lower extremity edema,more at the ankle, pitting.        Data Reviewed:   Na 134, K 4.2 Cl 103 bicarbonate 24, glucose 108, bun 21 cr 0,84  Mg 2.2  Lactic acid 0,6  Wbc 5,7 hgb 12.5 plt 168   CT right leg with diffuse subcutaneous soft tissue edema of the leg. No subcutaneous soft tissue emphysema. No organized fluid collection.  Right leg and ankle radiograph with no acute changes.   Chest radiograph with hypoinflation, mild cardiomegaly with no infiltrates or effusions.   Assessment and Plan: * Leg wound, right Chronic right leg wound seems to be improving, there are no local or systemic signs of infection.   Hold on further systemic antibiotic for now.  Patient has received one dose of 0,5 mg hydromorphone with improvement in his pain.  Plan for better pain control with acetaminophen, ibuprofen and for severe pain hydromorphone (later can be changed to oxycodone) Discontinue gabapentin, note he has not been taking tramadol (listed in his medication list). Further work up with right leg doppler US, note that he has one study from 04/2023 negative for deep vein thrombosis.  Pt and Ot.   Hyponatremia Renal function with serum cr at 0,84 with Na at 134.  Will  allow patient regular diet, will hold on IV fluids for now.  Follow up renal function and electrolytes in am.   Dyslipidemia Continue with statin therapy.  PVD (peripheral vascular disease) (HCC) Continue with aspirin and atorvastatin.  Patient has a pedal pulse palpable, the distal foot is warm and has no discoloration.       Advance Care Planning:   Code Status: Full Code   Consults: none   Family Communication: no family at the bedside   Severity of Illness: The appropriate patient status for this patient is OBSERVATION. Observation status is judged to be reasonable and necessary in order to provide the required intensity of service to ensure the patient's safety. The patient's presenting symptoms, physical exam findings, and initial  radiographic and laboratory data in the context of their medical condition is felt to place them at decreased risk for further clinical deterioration. Furthermore, it is anticipated that the patient will be medically stable for discharge from the hospital within 2 midnights of admission.   Author: Coralie Keens, MD 07/21/2023 3:01 AM  For on call review [email or web address].

## 2023-07-21 NOTE — Progress Notes (Signed)
 Discharge orders in, discharge teaching given with no further questions at this time, awaiting arrival of patients wife.

## 2023-07-21 NOTE — Assessment & Plan Note (Signed)
 Continue with statin therapy.  ?

## 2023-07-21 NOTE — Evaluation (Signed)
 Physical Therapy Evaluation Patient Details Name: Roberto Rosales MRN: [id number] DOB: [birth year] Today's Date: 07/21/2023  History of Present Illness  Pt admitted due to pain from wound on Rt LE  Clinical Impression  Pt appears to be at prior level         If plan is discharge home, recommend the following:  none   Can travel by private vehicle    yes    Equipment Recommendations  none  Recommendations for Other Services    none   Functional Status Assessment   Modified I     Precautions / Restrictions Precautions Precaution/Restrictions Comments: cellulitis Restrictions Weight Bearing Restrictions Per Provider Order: No      Mobility  Bed Mobility Overal bed mobility: Independent                  Transfers Overall transfer level: Modified independent Equipment used: Rolling walker (2 wheels)                    Ambulation/Gait Ambulation/Gait assistance: Modified independent (Device/Increase time) Gait Distance (Feet): 120 Feet Assistive device: Rolling walker (2 wheels) Gait Pattern/deviations: WFL(Within Functional Limits)          Stairs            Wheelchair Mobility     Tilt Bed    Modified Rankin (Stroke Patients Only)       Balance                                             Pertinent Vitals/Pain Pain Assessment Pain Assessment: 0-10 Pain Score: 3  Pain Location: Rt LE increases with ambularion Pain Descriptors / Indicators: Aching, Burning Pain Intervention(s): Limited activity within patient's tolerance    Home Living Family/patient expects to be discharged to:: Private residence Living Arrangements: Spouse/significant other Available Help at Discharge: Family Type of Home: House           Home Equipment: Agricultural consultant (2 wheels)      Prior Function Prior Level of Function : Independent/Modified Independent                     Extremity/Trunk Assessment         Lower Extremity Assessment Lower Extremity Assessment: Overall WFL for tasks assessed       Communication   Communication Communication: No apparent difficulties    Cognition Arousal: Alert Behavior During Therapy: WFL for tasks assessed/performed   PT - Cognitive impairments: No apparent impairments                         Following commands: Intact       Cueing Cueing Techniques: Verbal cues     General Comments      Exercises     Assessment/Plan    PT Assessment Patient does not need any further PT services  PT Problem List         PT Treatment Interventions      PT Goals (Current goals can be found in the Care Plan section)  Acute Rehab PT Goals Patient Stated Goal: to go home; pt going to vascular at [facility] Wed for wound consult PT Goal Formulation: With patient Time For Goal Achievement: 07/21/23 Potential to Achieve Goals: Good    Frequency       Co-evaluation  AM-PAC PT "6 Clicks" Mobility  Outcome Measure Help needed turning from your back to your side while in a flat bed without using bedrails?: None Help needed moving from lying on your back to sitting on the side of a flat bed without using bedrails?: None Help needed moving to and from a bed to a chair (including a wheelchair)?: None Help needed standing up from a chair using your arms (e.g., wheelchair or bedside chair)?: None Help needed to walk in hospital room?: None Help needed climbing 3-5 steps with a railing? : None 6 Click Score: 24    End of Session Equipment Utilized During Treatment: Gait belt Activity Tolerance: Patient tolerated treatment well Patient left: in bed;with call bell/phone within reach Nurse Communication: Mobility status PT Visit Diagnosis: Pain Pain - Right/Left: Right Pain - part of body: Leg    Time: 1015-1030 PT Time Calculation (min) (ACUTE ONLY): 15 min   Charges:   PT Evaluation $PT Eval Low Complexity: 1  Low   PT General Charges $$ ACUTE PT VISIT: 1 Visit        Virgina Organ, PT CLT [phone]  07/21/2023, 10:35 AM

## 2023-07-21 NOTE — Assessment & Plan Note (Addendum)
 On admission. Renal function with serum cr at 0,84 with Na at 134.  Will allow patient regular diet, will hold on IV fluids for now.  Follow up renal function and electrolytes in am.   07-21-2023 Na of 134. Clinically insignificant.

## 2023-07-21 NOTE — Hospital Course (Signed)
 HPI:  Roberto Rosales is a 78 y.o. male with medical history significant of dyslipidemia, peripheral vascular disease and right leg wound who presented with worsening right leg pain.  Patient has a chronic right leg wound since November 2024. Follows with the wound care center at [facility]. He has been under different antibiotic regimens and different wound care protocols.  His wound started as a blister and evolved into a large deep ulcer.  He has completed yesterday a 10 days of Cephalexin with good toleration and has been doing daily wound care.  Overall his wound has been improving and healing, but today he developed significant right leg pain. It has been severe in intensity, worse to touch, with no improving factors and with no radiation.  No frank fever of chills. He has been taking gabapentin for pain, with no significant results besides making him sleepy.   Significant Events: Admitted 07/20/2023 open right leg wound   Significant Labs: WBC 5.7, HgB 12.5, plt 168 Na 134, K 4.2, CO2 of 24, BUN 21, scr 0.84, glu 108 ESR 5 Lactic acid 0.6  Significant Imaging Studies: CXR No active disease.  Right ankle/tib/fib No acute bony abnormality.  CT right tib/fib Diffuse subcutaneus soft tissue edema of the leg. No subcutaneus soft tissue emphysema. No organized fluid collection. Please note a necrotizing fasciitis cannot be excluded as this is a clinical diagnosis. 2.  No acute displaced fracture or dislocation  Antibiotic Therapy: Anti-infectives (From admission, onward)    Start     Dose/Rate Route Frequency Ordered Stop   07/21/23 0030  piperacillin-tazobactam (ZOSYN) IVPB 3.375 g        3.375 g 100 mL/hr over 30 Minutes Intravenous  Once 07/21/23 0022 07/21/23 0247   07/21/23 0000  vancomycin (VANCOREADY) IVPB 1750 mg/350 mL        1,750 mg 175 mL/hr over 120 Minutes Intravenous  Once 07/20/23 2349 07/21/23 0230       Procedures:   Consultants:

## 2023-07-21 NOTE — Assessment & Plan Note (Addendum)
 On admission.  Continue with aspirin and atorvastatin.  Patient has a pedal pulse palpable, the distal foot is warm and has no discoloration.   07-21-2023 f/u [facility] vascular surgery.Rx for oxycodone and cymbalta for neuropathic pain.

## 2023-07-25 DIAGNOSIS — L97319 Non-pressure chronic ulcer of right ankle with unspecified severity: Secondary | ICD-10-CM | POA: Diagnosis not present

## 2023-07-25 DIAGNOSIS — I83013 Varicose veins of right lower extremity with ulcer of ankle: Secondary | ICD-10-CM | POA: Diagnosis not present

## 2023-07-25 LAB — CULTURE, BLOOD (ROUTINE X 2)
Culture: NO GROWTH
Special Requests: ADEQUATE

## 2023-07-26 LAB — CULTURE, BLOOD (ROUTINE X 2)
Culture: NO GROWTH
Special Requests: ADEQUATE

## 2023-07-30 DIAGNOSIS — I10 Essential (primary) hypertension: Secondary | ICD-10-CM | POA: Diagnosis not present

## 2023-07-30 DIAGNOSIS — L97819 Non-pressure chronic ulcer of other part of right lower leg with unspecified severity: Secondary | ICD-10-CM | POA: Diagnosis not present

## 2023-07-30 DIAGNOSIS — G629 Polyneuropathy, unspecified: Secondary | ICD-10-CM | POA: Diagnosis not present

## 2023-07-30 DIAGNOSIS — Z792 Long term (current) use of antibiotics: Secondary | ICD-10-CM | POA: Diagnosis not present

## 2023-07-30 DIAGNOSIS — E785 Hyperlipidemia, unspecified: Secondary | ICD-10-CM | POA: Diagnosis not present

## 2023-07-30 DIAGNOSIS — I739 Peripheral vascular disease, unspecified: Secondary | ICD-10-CM | POA: Diagnosis not present

## 2023-07-30 DIAGNOSIS — I83811 Varicose veins of right lower extremities with pain: Secondary | ICD-10-CM | POA: Diagnosis not present

## 2023-07-30 DIAGNOSIS — L97812 Non-pressure chronic ulcer of other part of right lower leg with fat layer exposed: Secondary | ICD-10-CM | POA: Diagnosis not present

## 2023-07-30 DIAGNOSIS — G8918 Other acute postprocedural pain: Secondary | ICD-10-CM | POA: Diagnosis not present

## 2023-07-30 DIAGNOSIS — Z7982 Long term (current) use of aspirin: Secondary | ICD-10-CM | POA: Diagnosis not present

## 2023-07-30 DIAGNOSIS — Z885 Allergy status to narcotic agent status: Secondary | ICD-10-CM | POA: Diagnosis not present

## 2023-07-30 DIAGNOSIS — H409 Unspecified glaucoma: Secondary | ICD-10-CM | POA: Diagnosis not present

## 2023-07-30 DIAGNOSIS — I83018 Varicose veins of right lower extremity with ulcer other part of lower leg: Secondary | ICD-10-CM | POA: Diagnosis not present

## 2023-07-30 DIAGNOSIS — Z79891 Long term (current) use of opiate analgesic: Secondary | ICD-10-CM | POA: Diagnosis not present

## 2023-07-30 DIAGNOSIS — G8929 Other chronic pain: Secondary | ICD-10-CM | POA: Diagnosis not present

## 2023-07-30 DIAGNOSIS — I872 Venous insufficiency (chronic) (peripheral): Secondary | ICD-10-CM | POA: Diagnosis not present

## 2023-07-30 DIAGNOSIS — Z79899 Other long term (current) drug therapy: Secondary | ICD-10-CM | POA: Diagnosis not present

## 2023-07-31 DIAGNOSIS — I872 Venous insufficiency (chronic) (peripheral): Secondary | ICD-10-CM | POA: Diagnosis not present

## 2023-07-31 DIAGNOSIS — I440 Atrioventricular block, first degree: Secondary | ICD-10-CM | POA: Diagnosis not present

## 2023-07-31 DIAGNOSIS — L97819 Non-pressure chronic ulcer of other part of right lower leg with unspecified severity: Secondary | ICD-10-CM | POA: Diagnosis not present

## 2023-08-08 ENCOUNTER — Emergency Department (HOSPITAL_COMMUNITY)

## 2023-08-08 ENCOUNTER — Other Ambulatory Visit: Payer: Self-pay

## 2023-08-08 ENCOUNTER — Emergency Department (HOSPITAL_COMMUNITY)
Admission: EM | Admit: 2023-08-08 | Discharge: 2023-08-08 | Disposition: A | Discharge status: Home / Self Care | Attending: Emergency Medicine | Admitting: Emergency Medicine

## 2023-08-08 ENCOUNTER — Encounter (HOSPITAL_COMMUNITY): Payer: Self-pay

## 2023-08-08 DIAGNOSIS — K2900 Acute gastritis without bleeding: Secondary | ICD-10-CM | POA: Diagnosis not present

## 2023-08-08 DIAGNOSIS — R109 Unspecified abdominal pain: Secondary | ICD-10-CM | POA: Diagnosis not present

## 2023-08-08 DIAGNOSIS — Z7982 Long term (current) use of aspirin: Secondary | ICD-10-CM | POA: Diagnosis not present

## 2023-08-08 DIAGNOSIS — R110 Nausea: Secondary | ICD-10-CM | POA: Diagnosis not present

## 2023-08-08 DIAGNOSIS — I10 Essential (primary) hypertension: Secondary | ICD-10-CM | POA: Diagnosis not present

## 2023-08-08 DIAGNOSIS — R9431 Abnormal electrocardiogram [ECG] [EKG]: Secondary | ICD-10-CM | POA: Diagnosis not present

## 2023-08-08 DIAGNOSIS — R531 Weakness: Secondary | ICD-10-CM | POA: Diagnosis not present

## 2023-08-08 DIAGNOSIS — R5381 Other malaise: Secondary | ICD-10-CM | POA: Diagnosis not present

## 2023-08-08 LAB — BASIC METABOLIC PANEL WITH GFR
Anion gap: 9 (ref 5–15)
BUN: 15 mg/dL (ref 8–23)
CO2: 22 mmol/L (ref 22–32)
Calcium: 9.2 mg/dL (ref 8.9–10.3)
Chloride: 104 mmol/L (ref 98–111)
Creatinine, Ser: 0.79 mg/dL (ref 0.61–1.24)
GFR, Estimated: >60 mL/min (ref >60)
Glucose, Bld: 121 mg/dL — ABNORMAL HIGH (ref 70–99)
Potassium: 3.5 mmol/L (ref 3.5–5.1)
Sodium: 135 mmol/L (ref 135–145)

## 2023-08-08 LAB — URINALYSIS, ROUTINE W REFLEX MICROSCOPIC
Bacteria, UA: NONE SEEN
Bilirubin Urine: NEGATIVE
Glucose, UA: NEGATIVE mg/dL
Ketones, ur: 5 mg/dL — AB
Leukocytes,Ua: NEGATIVE
Nitrite: NEGATIVE
Protein, ur: NEGATIVE mg/dL
Specific Gravity, Urine: 1.009 (ref 1.005–1.030)
pH: 8 (ref 5.0–8.0)

## 2023-08-08 LAB — CBC
HCT: 40.1 % (ref 39.0–52.0)
Hemoglobin: 13.8 g/dL (ref 13.0–17.0)
MCH: 29.2 pg (ref 26.0–34.0)
MCHC: 34.4 g/dL (ref 30.0–36.0)
MCV: 85 fL (ref 80.0–100.0)
Platelets: 168 10*3/uL (ref 150–400)
RBC: 4.72 MIL/uL (ref 4.22–5.81)
RDW: 13.2 % (ref 11.5–15.5)
WBC: 6.5 10*3/uL (ref 4.0–10.5)
nRBC: 0 % (ref 0.0–0.2)

## 2023-08-08 LAB — LACTIC ACID, PLASMA: Lactic Acid, Venous: 0.9 mmol/L (ref 0.5–1.9)

## 2023-08-08 LAB — HEPATIC FUNCTION PANEL
ALT: 19 U/L (ref 0–44)
AST: 24 U/L (ref 15–41)
Albumin: 4.1 g/dL (ref 3.5–5.0)
Alkaline Phosphatase: 59 U/L (ref 38–126)
Bilirubin, Direct: 0.2 mg/dL (ref 0.0–0.2)
Indirect Bilirubin: 0.8 mg/dL (ref 0.3–0.9)
Total Bilirubin: 1 mg/dL (ref 0.0–1.2)
Total Protein: 7 g/dL (ref 6.5–8.1)

## 2023-08-08 LAB — LIPASE, BLOOD: Lipase: 43 U/L (ref 11–51)

## 2023-08-08 LAB — CBG MONITORING, ED: Glucose-Capillary: 110 mg/dL — ABNORMAL HIGH (ref 70–99)

## 2023-08-08 MED ORDER — PANTOPRAZOLE SODIUM 40 MG IV SOLR
40.0000 mg | Freq: Once | INTRAVENOUS | Status: AC
  Administered 2023-08-08: 40 mg via INTRAVENOUS
  Filled 2023-08-08: qty 10

## 2023-08-08 MED ORDER — SODIUM CHLORIDE 0.9 % IV BOLUS
1000.0000 mL | Freq: Once | INTRAVENOUS | Status: AC
  Administered 2023-08-08: 1000 mL via INTRAVENOUS

## 2023-08-08 MED ORDER — ONDANSETRON 4 MG PO TBDP
ORAL_TABLET | ORAL | 2 refills | Status: AC
Start: 2023-08-08 — End: ?

## 2023-08-08 MED ORDER — IOHEXOL 300 MG/ML  SOLN
100.0000 mL | Freq: Once | INTRAMUSCULAR | Status: AC | PRN
  Administered 2023-08-08: 100 mL via INTRAVENOUS

## 2023-08-08 MED ORDER — PANTOPRAZOLE SODIUM 20 MG PO TBEC: 20.0000 mg | DELAYED_RELEASE_TABLET | Freq: Every day | ORAL | 2 refills | Status: AC

## 2023-08-08 NOTE — ED Notes (Signed)
 Pt requested update. MD notified

## 2023-08-08 NOTE — ED Provider Notes (Signed)
 [place] EMERGENC[facility] DEPARTMENT AT [facility] Provider Note   CSN: [id number] Arrival date & time: 08/08/23  6213     History  Chief Complaint  Patient presents with   Weakness    Roberto Rosales is a 78 y.o. male.  Patient has hyperlipidemia and a recent skin graft to his right lower leg.  Patient complains of nausea for a week  The history is provided by the patient and medical records. No language interpreter was used.  Weakness Severity:  Mild Onset quality:  Gradual Timing:  Intermittent Progression:  Waxing and waning Chronicity:  Recurrent Context: not alcohol use   Relieved by:  Nothing Worsened by:  Nothing Ineffective treatments:  None tried Associated symptoms: nausea   Associated symptoms: no abdominal pain, no chest pain, no cough, no diarrhea, no frequency, no headaches and no seizures        Home Medications Prior to Admission medications   Medication Sig Start Date End Date Taking? Authorizing Provider  ondansetron (ZOFRAN-ODT) 4 MG disintegrating tablet 4mg  ODT q4 hours prn nausea/vomit 08/08/23  Yes Bethann Berkshire, MD  pantoprazole (PROTONIX) 20 MG tablet Take 1 tablet (20 mg total) by mouth daily. 08/08/23  Yes Bethann Berkshire, MD  acetaminophen (TYLENOL) 325 MG tablet Take 2 tablets (650 mg total) by mouth every 6 (six) hours as needed for mild pain (pain score 1-3) (or Fever >/= 101). 04/07/23   Shon Hale, MD  aspirin EC 81 MG tablet Take 1 tablet (81 mg total) by mouth daily with breakfast. Swallow whole. 04/08/23   Shon Hale, MD  atorvastatin (LIPITOR) 10 MG tablet Take 5 mg by mouth at bedtime.  08/16/16   [provider]  dorzolamide-timolol (COSOPT) 22.3-6.8 MG/ML ophthalmic solution Place 1 drop into the left eye 2 (two) times daily.    [provider]  DULoxetine (CYMBALTA) 30 MG capsule Take 1 capsule (30 mg total) by mouth daily. 07/22/23 08/21/23  Carollee Herter, DO  hydrocortisone 2.5 % cream Apply topically 3  (three) times daily. 05/16/23   [provider]  latanoprost (XALATAN) 0.005 % ophthalmic solution Place 1 drop into both eyes at bedtime.  07/18/16   [provider]  Multiple Vitamin (MULTIVITAMIN WITH MINERALS) TABS tablet Take 1 tablet by mouth daily.    [provider]  mupirocin ointment (BACTROBAN) 2 % SMARTSIG:0.5 Gram(s) Topical 3 Times Daily 04/19/23   [provider]      Allergies    Codeine    Review of Systems   Review of Systems  Constitutional:  Negative for appetite change and fatigue.  HENT:  Negative for congestion, ear discharge and sinus pressure.   Eyes:  Negative for discharge.  Respiratory:  Negative for cough.   Cardiovascular:  Negative for chest pain.  Gastrointestinal:  Positive for nausea. Negative for abdominal pain and diarrhea.  Genitourinary:  Negative for frequency and hematuria.  Musculoskeletal:  Negative for back pain.  Skin:  Negative for rash.  Neurological:  Positive for weakness. Negative for seizures and headaches.  Psychiatric/Behavioral:  Negative for hallucinations.     Physical Exam Updated Vital Signs BP (!) 163/72   Pulse (!) 54   Temp (!) 97.3 F (36.3 C)   Resp 12   Ht 6' (1.829 m)   Wt 87.9 kg   SpO2 97%   BMI 26.28 kg/m  Physical Exam Vitals and nursing note reviewed.  Constitutional:      Appearance: Normal appearance. He is well-developed.  HENT:  Head: Normocephalic.     Right Ear: Tympanic membrane normal.     Left Ear: Tympanic membrane normal.     Nose: Nose normal.  Eyes:     General: No scleral icterus.    Conjunctiva/sclera: Conjunctivae normal.  Neck:     Thyroid: No thyromegaly.  Cardiovascular:     Rate and Rhythm: Normal rate and regular rhythm.     Heart sounds: No murmur heard.    No friction rub. No gallop.  Pulmonary:     Breath sounds: No stridor. No wheezing or rales.  Chest:     Chest wall: No tenderness.  Abdominal:     General: There is no  distension.     Tenderness: There is no abdominal tenderness. There is no rebound.  Musculoskeletal:        General: Normal range of motion.     Cervical back: Neck supple.     Comments: Skin graft to right lower leg healing  Lymphadenopathy:     Cervical: No cervical adenopathy.  Skin:    Findings: No erythema or rash.  Neurological:     Mental Status: He is alert and oriented to person, place, and time.     Motor: No abnormal muscle tone.     Coordination: Coordination normal.  Psychiatric:        Behavior: Behavior normal.     ED Results / Procedures / Treatments   Labs (all labs ordered are listed, but only abnormal results are displayed) Labs Reviewed  BASIC METABOLIC PANEL WITH GFR - Abnormal; Notable for the following components:      Result Value   Glucose, Bld 121 (*)    All other components within normal limits  URINALYSIS, ROUTINE W REFLEX MICROSCOPIC - Abnormal; Notable for the following components:   Hgb urine dipstick SMALL (*)    Ketones, ur 5 (*)    All other components within normal limits  CBG MONITORING, ED - Abnormal; Notable for the following components:   Glucose-Capillary 110 (*)    All other components within normal limits  CBC  HEPATIC FUNCTION PANEL  LIPASE, BLOOD  LACTIC ACID, PLASMA    EKG None  Radiology CT ABDOMEN PELVIS W CONTRAST Result Date: 08/08/2023 CLINICAL DATA:  Abdominal pain, acute, nonlocalized. EXAM: CT ABDOMEN AND PELVIS WITH CONTRAST TECHNIQUE: Multidetector CT imaging of the abdomen and pelvis was performed using the standard protocol following bolus administration of intravenous contrast. RADIATION DOSE REDUCTION: This exam was performed according to the departmental dose-optimization program which includes automated exposure control, adjustment of the mA and/or kV according to patient size and/or use of iterative reconstruction technique. CONTRAST:  OMNIPAQUE IOHEXOL 300 MG/ML  SOLN COMPARISON:  MRI abdomen dated  12/16/2021. CT abdomen/pelvis dated 06/02/2019. FINDINGS: Lower chest: No acute abnormality. Hepatobiliary: No focal liver abnormality is seen. No gallstones, gallbladder wall thickening, or biliary dilatation. Pancreas: Stable rounded 9 mm cystic lesion in the distal pancreatic tail, previously characterized on MRI and favored to reflect a small IPMN or pseudocyst, for which no specific further follow-up is required. No pancreatic ductal dilatation or surrounding inflammatory changes. Spleen: Unremarkable. Adrenals/Urinary Tract: Adrenal glands are unremarkable. Kidneys are normal, without renal calculi, focal lesion, or hydronephrosis. Bladder is unremarkable. Stomach/Bowel: Stomach is within normal limits. Appendix appears normal. No evidence of bowel wall thickening, distention, or inflammatory changes. Moderate colonic stool burden. Vascular/Lymphatic: The abdominal aorta is normal in caliber with atherosclerotic calcification. No enlarged abdominopelvic lymph nodes. Reproductive: Prostate is unremarkable. Other: No  abdominal wall hernia or abnormality. No abdominopelvic ascites. No intraperitoneal free air. Musculoskeletal: Osteopenia. Dextroscoliotic curvature of the lumbar spine. Multilevel degenerative changes of the spine. No acute osseous abnormality. No suspicious osseous lesion. IMPRESSION: No acute localizing findings in the abdomen or pelvis. Aortic Atherosclerosis (ICD10-I70.0). Electronically Signed   By: Hart Robinsons M.D.   On: 08/08/2023 14:10    Procedures Procedures    Medications Ordered in ED Medications  sodium chloride 0.9 % bolus 1,000 mL (0 mLs Intravenous Stopped 08/08/23 1002)  pantoprazole (PROTONIX) injection 40 mg (40 mg Intravenous Given 08/08/23 0802)  iohexol (OMNIPAQUE) 300 MG/ML solution 100 mL (100 mLs Intravenous Contrast Given 08/08/23 1211)    ED Course/ Medical Decision Making/ A&P                                 Medical Decision Making Amount and/or  Complexity of Data Reviewed Labs: ordered. Radiology: ordered.  Risk Prescription drug management.  Labs and CT scan unremarkable. Patient with persistent nausea.  He will be started on Protonix and Zofran and follow-up with GI        Final Clinical Impression(s) / ED Diagnoses Final diagnoses:  Acute superficial gastritis without hemorrhage    Rx / DC Orders ED Discharge Orders          Ordered    pantoprazole (PROTONIX) 20 MG tablet  Daily        08/08/23 1436    ondansetron (ZOFRAN-ODT) 4 MG disintegrating tablet        08/08/23 1436              Bethann Berkshire, MD 08/12/23 1001

## 2023-08-08 NOTE — Discharge Instructions (Signed)
 Follow-up with Dr. Marletta Lor or one of his associates in the next couple weeks for your stomach issues

## 2023-08-08 NOTE — ED Triage Notes (Signed)
 Pt comes in with chills, weakness, and nausea. Per pt nauseous for a week. Pt just had a skin graft done a week ago at [facility] [place] for an ulcer on the rt foot. Pt states changing bandages once a day per discharge orders.

## 2023-08-09 DIAGNOSIS — L03115 Cellulitis of right lower limb: Secondary | ICD-10-CM | POA: Diagnosis not present

## 2023-08-15 DIAGNOSIS — L97319 Non-pressure chronic ulcer of right ankle with unspecified severity: Secondary | ICD-10-CM | POA: Diagnosis not present

## 2023-08-15 DIAGNOSIS — L03115 Cellulitis of right lower limb: Secondary | ICD-10-CM | POA: Diagnosis not present

## 2023-08-15 DIAGNOSIS — I83013 Varicose veins of right lower extremity with ulcer of ankle: Secondary | ICD-10-CM | POA: Diagnosis not present

## 2023-08-29 DIAGNOSIS — I872 Venous insufficiency (chronic) (peripheral): Secondary | ICD-10-CM | POA: Diagnosis not present

## 2023-08-29 DIAGNOSIS — I83008 Varicose veins of unspecified lower extremity with ulcer other part of lower leg: Secondary | ICD-10-CM | POA: Diagnosis not present

## 2023-08-29 DIAGNOSIS — I83013 Varicose veins of right lower extremity with ulcer of ankle: Secondary | ICD-10-CM | POA: Diagnosis not present

## 2023-08-29 DIAGNOSIS — L97909 Non-pressure chronic ulcer of unspecified part of unspecified lower leg with unspecified severity: Secondary | ICD-10-CM | POA: Diagnosis not present

## 2023-08-29 DIAGNOSIS — L97319 Non-pressure chronic ulcer of right ankle with unspecified severity: Secondary | ICD-10-CM | POA: Diagnosis not present

## 2023-09-19 DIAGNOSIS — L97909 Non-pressure chronic ulcer of unspecified part of unspecified lower leg with unspecified severity: Secondary | ICD-10-CM | POA: Diagnosis not present

## 2023-09-19 DIAGNOSIS — I83008 Varicose veins of unspecified lower extremity with ulcer other part of lower leg: Secondary | ICD-10-CM | POA: Diagnosis not present

## 2023-09-20 DIAGNOSIS — Z961 Presence of intraocular lens: Secondary | ICD-10-CM | POA: Diagnosis not present

## 2023-09-20 DIAGNOSIS — H40021 Open angle with borderline findings, high risk, right eye: Secondary | ICD-10-CM | POA: Diagnosis not present

## 2023-09-20 DIAGNOSIS — H401122 Primary open-angle glaucoma, left eye, moderate stage: Secondary | ICD-10-CM | POA: Diagnosis not present

## 2023-09-27 DIAGNOSIS — R600 Localized edema: Secondary | ICD-10-CM | POA: Diagnosis not present

## 2023-09-27 DIAGNOSIS — R238 Other skin changes: Secondary | ICD-10-CM | POA: Diagnosis not present

## 2023-09-27 DIAGNOSIS — Z6826 Body mass index (BMI) 26.0-26.9, adult: Secondary | ICD-10-CM | POA: Diagnosis not present

## 2023-10-02 DIAGNOSIS — L600 Ingrowing nail: Secondary | ICD-10-CM | POA: Diagnosis not present

## 2023-10-02 DIAGNOSIS — M79671 Pain in right foot: Secondary | ICD-10-CM | POA: Diagnosis not present

## 2023-10-02 DIAGNOSIS — M79674 Pain in right toe(s): Secondary | ICD-10-CM | POA: Diagnosis not present

## 2023-10-16 DIAGNOSIS — M79674 Pain in right toe(s): Secondary | ICD-10-CM | POA: Diagnosis not present

## 2023-10-16 DIAGNOSIS — L03031 Cellulitis of right toe: Secondary | ICD-10-CM | POA: Diagnosis not present

## 2023-10-16 DIAGNOSIS — L600 Ingrowing nail: Secondary | ICD-10-CM | POA: Diagnosis not present

## 2023-10-16 DIAGNOSIS — M79671 Pain in right foot: Secondary | ICD-10-CM | POA: Diagnosis not present

## 2023-10-17 DIAGNOSIS — L309 Dermatitis, unspecified: Secondary | ICD-10-CM | POA: Diagnosis not present

## 2023-10-17 DIAGNOSIS — I872 Venous insufficiency (chronic) (peripheral): Secondary | ICD-10-CM | POA: Diagnosis not present

## 2023-10-17 DIAGNOSIS — S81801A Unspecified open wound, right lower leg, initial encounter: Secondary | ICD-10-CM | POA: Diagnosis not present

## 2023-10-17 DIAGNOSIS — R600 Localized edema: Secondary | ICD-10-CM | POA: Diagnosis not present

## 2023-10-17 DIAGNOSIS — I83013 Varicose veins of right lower extremity with ulcer of ankle: Secondary | ICD-10-CM | POA: Diagnosis not present

## 2023-10-17 DIAGNOSIS — L97319 Non-pressure chronic ulcer of right ankle with unspecified severity: Secondary | ICD-10-CM | POA: Diagnosis not present

## 2023-10-17 DIAGNOSIS — S80822A Blister (nonthermal), left lower leg, initial encounter: Secondary | ICD-10-CM | POA: Diagnosis not present

## 2023-11-01 DIAGNOSIS — H35373 Puckering of macula, bilateral: Secondary | ICD-10-CM | POA: Diagnosis not present

## 2023-11-07 DIAGNOSIS — I872 Venous insufficiency (chronic) (peripheral): Secondary | ICD-10-CM | POA: Diagnosis not present

## 2023-11-07 DIAGNOSIS — I83891 Varicose veins of right lower extremities with other complications: Secondary | ICD-10-CM | POA: Diagnosis not present

## 2023-11-07 DIAGNOSIS — L97229 Non-pressure chronic ulcer of left calf with unspecified severity: Secondary | ICD-10-CM | POA: Diagnosis not present

## 2023-11-07 DIAGNOSIS — I83013 Varicose veins of right lower extremity with ulcer of ankle: Secondary | ICD-10-CM | POA: Diagnosis not present

## 2023-11-07 DIAGNOSIS — I83022 Varicose veins of left lower extremity with ulcer of calf: Secondary | ICD-10-CM | POA: Diagnosis not present

## 2023-11-07 DIAGNOSIS — L97319 Non-pressure chronic ulcer of right ankle with unspecified severity: Secondary | ICD-10-CM | POA: Diagnosis not present

## 2023-11-19 DIAGNOSIS — L600 Ingrowing nail: Secondary | ICD-10-CM | POA: Diagnosis not present

## 2023-11-19 DIAGNOSIS — M79671 Pain in right foot: Secondary | ICD-10-CM | POA: Diagnosis not present

## 2023-11-19 DIAGNOSIS — M79674 Pain in right toe(s): Secondary | ICD-10-CM | POA: Diagnosis not present

## 2023-11-27 DIAGNOSIS — E7800 Pure hypercholesterolemia, unspecified: Secondary | ICD-10-CM | POA: Diagnosis not present

## 2023-11-27 DIAGNOSIS — R7301 Impaired fasting glucose: Secondary | ICD-10-CM | POA: Diagnosis not present

## 2023-11-27 DIAGNOSIS — I10 Essential (primary) hypertension: Secondary | ICD-10-CM | POA: Diagnosis not present

## 2023-11-27 DIAGNOSIS — E7849 Other hyperlipidemia: Secondary | ICD-10-CM | POA: Diagnosis not present

## 2023-12-04 DIAGNOSIS — E7849 Other hyperlipidemia: Secondary | ICD-10-CM | POA: Diagnosis not present

## 2023-12-04 DIAGNOSIS — E782 Mixed hyperlipidemia: Secondary | ICD-10-CM | POA: Diagnosis not present

## 2023-12-04 DIAGNOSIS — R600 Localized edema: Secondary | ICD-10-CM | POA: Diagnosis not present

## 2023-12-04 DIAGNOSIS — Z6826 Body mass index (BMI) 26.0-26.9, adult: Secondary | ICD-10-CM | POA: Diagnosis not present

## 2023-12-04 DIAGNOSIS — M21371 Foot drop, right foot: Secondary | ICD-10-CM | POA: Diagnosis not present

## 2023-12-04 DIAGNOSIS — I83008 Varicose veins of unspecified lower extremity with ulcer other part of lower leg: Secondary | ICD-10-CM | POA: Diagnosis not present

## 2023-12-12 DIAGNOSIS — I872 Venous insufficiency (chronic) (peripheral): Secondary | ICD-10-CM | POA: Diagnosis not present

## 2023-12-18 DIAGNOSIS — Z961 Presence of intraocular lens: Secondary | ICD-10-CM | POA: Diagnosis not present

## 2023-12-18 DIAGNOSIS — H40021 Open angle with borderline findings, high risk, right eye: Secondary | ICD-10-CM | POA: Diagnosis not present

## 2023-12-18 DIAGNOSIS — H401122 Primary open-angle glaucoma, left eye, moderate stage: Secondary | ICD-10-CM | POA: Diagnosis not present

## 2024-01-04 DIAGNOSIS — E1140 Type 2 diabetes mellitus with diabetic neuropathy, unspecified: Secondary | ICD-10-CM | POA: Diagnosis not present

## 2024-01-04 DIAGNOSIS — K219 Gastro-esophageal reflux disease without esophagitis: Secondary | ICD-10-CM | POA: Diagnosis not present

## 2024-01-04 DIAGNOSIS — I10 Essential (primary) hypertension: Secondary | ICD-10-CM | POA: Diagnosis not present

## 2024-01-04 DIAGNOSIS — E782 Mixed hyperlipidemia: Secondary | ICD-10-CM | POA: Diagnosis not present

## 2024-01-17 DIAGNOSIS — I10 Essential (primary) hypertension: Secondary | ICD-10-CM | POA: Diagnosis not present

## 2024-01-17 DIAGNOSIS — E782 Mixed hyperlipidemia: Secondary | ICD-10-CM | POA: Diagnosis not present

## 2024-01-17 DIAGNOSIS — L97809 Non-pressure chronic ulcer of other part of unspecified lower leg with unspecified severity: Secondary | ICD-10-CM | POA: Diagnosis not present

## 2024-01-17 DIAGNOSIS — E1140 Type 2 diabetes mellitus with diabetic neuropathy, unspecified: Secondary | ICD-10-CM | POA: Diagnosis not present

## 2024-01-17 DIAGNOSIS — G629 Polyneuropathy, unspecified: Secondary | ICD-10-CM | POA: Diagnosis not present

## 2024-01-17 DIAGNOSIS — I739 Peripheral vascular disease, unspecified: Secondary | ICD-10-CM | POA: Diagnosis not present

## 2024-01-17 DIAGNOSIS — K219 Gastro-esophageal reflux disease without esophagitis: Secondary | ICD-10-CM | POA: Diagnosis not present

## 2024-01-17 DIAGNOSIS — I83008 Varicose veins of unspecified lower extremity with ulcer other part of lower leg: Secondary | ICD-10-CM | POA: Diagnosis not present

## 2024-01-17 DIAGNOSIS — H409 Unspecified glaucoma: Secondary | ICD-10-CM | POA: Diagnosis not present

## 2024-01-17 DIAGNOSIS — R040 Epistaxis: Secondary | ICD-10-CM | POA: Diagnosis not present

## 2024-02-13 DIAGNOSIS — K219 Gastro-esophageal reflux disease without esophagitis: Secondary | ICD-10-CM | POA: Diagnosis not present

## 2024-02-13 DIAGNOSIS — E782 Mixed hyperlipidemia: Secondary | ICD-10-CM | POA: Diagnosis not present

## 2024-02-13 DIAGNOSIS — H409 Unspecified glaucoma: Secondary | ICD-10-CM | POA: Diagnosis not present

## 2024-02-13 DIAGNOSIS — E1140 Type 2 diabetes mellitus with diabetic neuropathy, unspecified: Secondary | ICD-10-CM | POA: Diagnosis not present

## 2024-02-13 DIAGNOSIS — R040 Epistaxis: Secondary | ICD-10-CM | POA: Diagnosis not present

## 2024-02-13 DIAGNOSIS — I739 Peripheral vascular disease, unspecified: Secondary | ICD-10-CM | POA: Diagnosis not present

## 2024-02-13 DIAGNOSIS — I83008 Varicose veins of unspecified lower extremity with ulcer other part of lower leg: Secondary | ICD-10-CM | POA: Diagnosis not present

## 2024-02-13 DIAGNOSIS — L97809 Non-pressure chronic ulcer of other part of unspecified lower leg with unspecified severity: Secondary | ICD-10-CM | POA: Diagnosis not present

## 2024-02-13 DIAGNOSIS — I10 Essential (primary) hypertension: Secondary | ICD-10-CM | POA: Diagnosis not present

## 2024-02-13 DIAGNOSIS — G629 Polyneuropathy, unspecified: Secondary | ICD-10-CM | POA: Diagnosis not present

## 2024-02-19 DIAGNOSIS — I83023 Varicose veins of left lower extremity with ulcer of ankle: Secondary | ICD-10-CM | POA: Diagnosis not present

## 2024-02-19 DIAGNOSIS — I872 Venous insufficiency (chronic) (peripheral): Secondary | ICD-10-CM | POA: Diagnosis not present

## 2024-02-19 DIAGNOSIS — L97329 Non-pressure chronic ulcer of left ankle with unspecified severity: Secondary | ICD-10-CM | POA: Diagnosis not present

## 2024-02-21 DIAGNOSIS — Z23 Encounter for immunization: Secondary | ICD-10-CM | POA: Diagnosis not present

## 2024-03-07 DIAGNOSIS — E782 Mixed hyperlipidemia: Secondary | ICD-10-CM | POA: Diagnosis not present

## 2024-03-07 DIAGNOSIS — E1140 Type 2 diabetes mellitus with diabetic neuropathy, unspecified: Secondary | ICD-10-CM | POA: Diagnosis not present

## 2024-03-07 DIAGNOSIS — I10 Essential (primary) hypertension: Secondary | ICD-10-CM | POA: Diagnosis not present

## 2024-03-07 DIAGNOSIS — K219 Gastro-esophageal reflux disease without esophagitis: Secondary | ICD-10-CM | POA: Diagnosis not present

## 2024-03-12 DIAGNOSIS — K219 Gastro-esophageal reflux disease without esophagitis: Secondary | ICD-10-CM | POA: Diagnosis not present

## 2024-03-12 DIAGNOSIS — I739 Peripheral vascular disease, unspecified: Secondary | ICD-10-CM | POA: Diagnosis not present

## 2024-03-12 DIAGNOSIS — I83008 Varicose veins of unspecified lower extremity with ulcer other part of lower leg: Secondary | ICD-10-CM | POA: Diagnosis not present

## 2024-03-12 DIAGNOSIS — L97809 Non-pressure chronic ulcer of other part of unspecified lower leg with unspecified severity: Secondary | ICD-10-CM | POA: Diagnosis not present

## 2024-03-12 DIAGNOSIS — I10 Essential (primary) hypertension: Secondary | ICD-10-CM | POA: Diagnosis not present

## 2024-03-12 DIAGNOSIS — E782 Mixed hyperlipidemia: Secondary | ICD-10-CM | POA: Diagnosis not present

## 2024-03-12 DIAGNOSIS — R040 Epistaxis: Secondary | ICD-10-CM | POA: Diagnosis not present

## 2024-03-12 DIAGNOSIS — G629 Polyneuropathy, unspecified: Secondary | ICD-10-CM | POA: Diagnosis not present

## 2024-03-12 DIAGNOSIS — E1140 Type 2 diabetes mellitus with diabetic neuropathy, unspecified: Secondary | ICD-10-CM | POA: Diagnosis not present

## 2024-03-12 DIAGNOSIS — H409 Unspecified glaucoma: Secondary | ICD-10-CM | POA: Diagnosis not present

## 2024-03-14 NOTE — Progress Notes (Signed)
 Roberto Rosales                                          MRN: [id number]   03/14/2024   The [facility] Quality Team Specialist reviewed this patient medical record for the purposes of chart review for care gap closure. The following were reviewed: chart review for care gap closure-kidney health evaluation for diabetes:eGFR  and uACR.    [facility] Quality Team

## 2024-03-17 DIAGNOSIS — R0989 Other specified symptoms and signs involving the circulatory and respiratory systems: Secondary | ICD-10-CM | POA: Diagnosis not present

## 2024-03-25 DIAGNOSIS — I8392 Asymptomatic varicose veins of left lower extremity: Secondary | ICD-10-CM | POA: Diagnosis not present

## 2024-03-28 DIAGNOSIS — E7849 Other hyperlipidemia: Secondary | ICD-10-CM | POA: Diagnosis not present

## 2024-03-28 DIAGNOSIS — E1140 Type 2 diabetes mellitus with diabetic neuropathy, unspecified: Secondary | ICD-10-CM | POA: Diagnosis not present

## 2024-04-04 DIAGNOSIS — I10 Essential (primary) hypertension: Secondary | ICD-10-CM | POA: Diagnosis not present

## 2024-04-04 DIAGNOSIS — I809 Phlebitis and thrombophlebitis of unspecified site: Secondary | ICD-10-CM | POA: Diagnosis not present

## 2024-04-04 DIAGNOSIS — I739 Peripheral vascular disease, unspecified: Secondary | ICD-10-CM | POA: Diagnosis not present

## 2024-04-04 DIAGNOSIS — I83008 Varicose veins of unspecified lower extremity with ulcer other part of lower leg: Secondary | ICD-10-CM | POA: Diagnosis not present

## 2024-04-04 DIAGNOSIS — Z6825 Body mass index (BMI) 25.0-25.9, adult: Secondary | ICD-10-CM | POA: Diagnosis not present

## 2024-04-04 DIAGNOSIS — R600 Localized edema: Secondary | ICD-10-CM | POA: Diagnosis not present

## 2024-04-04 DIAGNOSIS — E1140 Type 2 diabetes mellitus with diabetic neuropathy, unspecified: Secondary | ICD-10-CM | POA: Diagnosis not present

## 2024-04-06 DIAGNOSIS — K219 Gastro-esophageal reflux disease without esophagitis: Secondary | ICD-10-CM | POA: Diagnosis not present

## 2024-04-06 DIAGNOSIS — E782 Mixed hyperlipidemia: Secondary | ICD-10-CM | POA: Diagnosis not present

## 2024-04-06 DIAGNOSIS — E1140 Type 2 diabetes mellitus with diabetic neuropathy, unspecified: Secondary | ICD-10-CM | POA: Diagnosis not present

## 2024-04-06 DIAGNOSIS — I10 Essential (primary) hypertension: Secondary | ICD-10-CM | POA: Diagnosis not present

## 2024-04-08 DIAGNOSIS — M1711 Unilateral primary osteoarthritis, right knee: Secondary | ICD-10-CM | POA: Diagnosis not present

## 2024-04-08 DIAGNOSIS — M23306 Other meniscus derangements, unspecified meniscus, right knee: Secondary | ICD-10-CM | POA: Diagnosis not present

## 2024-04-08 DIAGNOSIS — Z6826 Body mass index (BMI) 26.0-26.9, adult: Secondary | ICD-10-CM | POA: Diagnosis not present

## 2024-04-08 DIAGNOSIS — M25461 Effusion, right knee: Secondary | ICD-10-CM | POA: Diagnosis not present

## 2024-04-09 DIAGNOSIS — I872 Venous insufficiency (chronic) (peripheral): Secondary | ICD-10-CM | POA: Diagnosis not present

## 2024-04-17 DIAGNOSIS — K219 Gastro-esophageal reflux disease without esophagitis: Secondary | ICD-10-CM | POA: Diagnosis not present

## 2024-04-17 DIAGNOSIS — H409 Unspecified glaucoma: Secondary | ICD-10-CM | POA: Diagnosis not present

## 2024-04-17 DIAGNOSIS — R040 Epistaxis: Secondary | ICD-10-CM | POA: Diagnosis not present

## 2024-04-17 DIAGNOSIS — E782 Mixed hyperlipidemia: Secondary | ICD-10-CM | POA: Diagnosis not present

## 2024-04-17 DIAGNOSIS — I739 Peripheral vascular disease, unspecified: Secondary | ICD-10-CM | POA: Diagnosis not present

## 2024-04-17 DIAGNOSIS — I83008 Varicose veins of unspecified lower extremity with ulcer other part of lower leg: Secondary | ICD-10-CM | POA: Diagnosis not present

## 2024-04-17 DIAGNOSIS — L97809 Non-pressure chronic ulcer of other part of unspecified lower leg with unspecified severity: Secondary | ICD-10-CM | POA: Diagnosis not present

## 2024-04-17 DIAGNOSIS — G629 Polyneuropathy, unspecified: Secondary | ICD-10-CM | POA: Diagnosis not present

## 2024-04-17 DIAGNOSIS — E1140 Type 2 diabetes mellitus with diabetic neuropathy, unspecified: Secondary | ICD-10-CM | POA: Diagnosis not present
